# Patient Record
Sex: Male | Born: 1985 | Race: Black or African American | Hispanic: No | Marital: Married | Smoking: Never smoker
Health system: Southern US, Community
[De-identification: ages and names within clinical notes are randomized; demographics above are authoritative.]

## PROBLEM LIST (undated history)

## (undated) ENCOUNTER — Ambulatory Visit (HOSPITAL_COMMUNITY)

## (undated) DIAGNOSIS — Z87898 Personal history of other specified conditions: Secondary | ICD-10-CM

## (undated) DIAGNOSIS — Z982 Presence of cerebrospinal fluid drainage device: Secondary | ICD-10-CM

## (undated) DIAGNOSIS — G8929 Other chronic pain: Secondary | ICD-10-CM

## (undated) DIAGNOSIS — M549 Dorsalgia, unspecified: Secondary | ICD-10-CM

## (undated) DIAGNOSIS — J45909 Unspecified asthma, uncomplicated: Secondary | ICD-10-CM

## (undated) DIAGNOSIS — H547 Unspecified visual loss: Secondary | ICD-10-CM

## (undated) DIAGNOSIS — G43909 Migraine, unspecified, not intractable, without status migrainosus: Secondary | ICD-10-CM

## (undated) DIAGNOSIS — R569 Unspecified convulsions: Secondary | ICD-10-CM

## (undated) DIAGNOSIS — G919 Hydrocephalus, unspecified: Secondary | ICD-10-CM

## (undated) DIAGNOSIS — C801 Malignant (primary) neoplasm, unspecified: Secondary | ICD-10-CM

## (undated) DIAGNOSIS — H548 Legal blindness, as defined in USA: Secondary | ICD-10-CM

## (undated) HISTORY — DX: Unspecified convulsions: R56.9

## (undated) HISTORY — DX: Unspecified asthma, uncomplicated: J45.909

## (undated) HISTORY — PX: WISDOM TOOTH EXTRACTION: SHX21

## (undated) HISTORY — DX: Unspecified visual loss: H54.7

## (undated) HISTORY — DX: Presence of cerebrospinal fluid drainage device: Z98.2

## (undated) HISTORY — DX: Personal history of other specified conditions: Z87.898

## (undated) HISTORY — DX: Migraine, unspecified, not intractable, without status migrainosus: G43.909

## (undated) HISTORY — PX: VENTRICULO-PERITONEAL SHUNT PLACEMENT / LAPAROSCOPIC INSERTION PERITONEAL CATHETER: SUR1040

## (undated) HISTORY — PX: BRAIN SURGERY: SHX531

---

## 2009-06-29 HISTORY — PX: SPINAL FUSION: SHX223

## 2014-03-15 ENCOUNTER — Emergency Department (HOSPITAL_COMMUNITY)
Admission: EM | Admit: 2014-03-15 | Discharge: 2014-03-15 | Disposition: A | Discharge status: Home / Self Care | Payer: Medicaid Other | Attending: Emergency Medicine | Admitting: Emergency Medicine

## 2014-03-15 DIAGNOSIS — M542 Cervicalgia: Secondary | ICD-10-CM | POA: Diagnosis not present

## 2014-03-15 DIAGNOSIS — Z8669 Personal history of other diseases of the nervous system and sense organs: Secondary | ICD-10-CM | POA: Diagnosis not present

## 2014-03-15 DIAGNOSIS — Z760 Encounter for issue of repeat prescription: Secondary | ICD-10-CM | POA: Diagnosis not present

## 2014-03-15 MED ORDER — NAPROXEN 500 MG PO TABS: 500.0000 mg | ORAL_TABLET | Freq: Two times a day (BID) | ORAL | Status: DC

## 2014-03-15 MED ORDER — LEVETIRACETAM 1000 MG PO TABS: 1000.0000 mg | ORAL_TABLET | Freq: Two times a day (BID) | ORAL | Status: DC

## 2014-03-15 NOTE — Discharge Instructions (Signed)
Take the prescribed medication as directed. Follow-up with neurology and neurosurgeon as we discussed.  Use the resource guide to help find a primary care physician in the area. Return to the ED for new or worsening symptoms.   Emergency Department Resource Guide 1) Find a Doctor and Pay Out of Pocket Although you won't have to find out who is covered by your insurance plan, it is a good idea to ask around and get recommendations. You will then need to call the office and see if the doctor you have chosen will accept you as a new patient and what types of options they offer for patients who are self-pay. Some doctors offer discounts or will set up payment plans for their patients who do not have insurance, but you will need to ask so you aren't surprised when you get to your appointment.  2) Contact Your Local Health Department Not all health departments have doctors that can see patients for sick visits, but many do, so it is worth a call to see if yours does. If you don't know where your local health department is, you can check in your phone book. The CDC also has a tool to help you locate your state's health department, and many state websites also have listings of all of their local health departments.  3) Find a [facility] If your illness is not likely to be very severe or complicated, you may want to try a walk in clinic. These are popping up all over the country in pharmacies, drugstores, and shopping centers. They're usually staffed by nurse practitioners or physician assistants that have been trained to treat common illnesses and complaints. They're usually fairly quick and inexpensive. However, if you have serious medical issues or chronic medical problems, these are probably not your best option.  No Primary Care Doctor: - Call Health Connect at  [phone] - they can help you locate a primary care doctor that  accepts your insurance, provides certain services, etc. - Physician  Referral Service- [phone]  Chronic Pain Problems: Organization         Address  Phone   Notes  [facility]  [phone] Patients need to be referred by their primary care doctor.   Medication Assistance: Organization         Address  Phone   Notes  [facility] Medication [facility] [place] [place], [place] [address] [phone] --Must be a resident of [facility] -- Must have NO insurance coverage whatsoever (no Medicaid/ Medicare, etc.) -- The pt. MUST have a primary care doctor that directs their care regularly and follows them in the community   Med[facility]ist  [phone]   [employer]  [phone]    Agencies that provide inexpensive medical care: Organization         Address  Phone   Notes  [place]  [phone]   [facility]    [phone]   [facility] [place], [place] [address] [phone]   [place] [address] [place] [phone]   Planned Parenthood    [phone]   [facility]    [phone]   [place] and [place] [address], [place] Phone:  [phone], Fax:  [phone] Hours of Operation:  9 am - 6 pm, M-F.  Also accepts Medicaid/Medicare and self-pay.  [facility] for [place], [address], [place] Phone: [phone], Fax: [phone]. Hours of Operation:  8:30 am - 5:30 pm, M-F.  Also accepts Medicaid and self-pay.  [facility] [address] [place] Phone: [phone]   [place], [place], [place] [phone], Ext. [phone] Mondays & Thursdays: 7-9 AM.  First 15 patients are seen on a first come, first serve basis.    [place] Providers:  Organization         Address  Phone   Notes  [facility] [address], [place] [phone] Also accepts self-pay patients.  [facility] [address], [place]  [phone]   [place], [address], [place] [phone]   [facility] [address], [place] [phone]   Lucianne Lei [address], [place]   [phone] Only accepts [place] Access [place] patients after they have their name applied to their card.   Self-Pay (no insurance) in [facility]:  Organization         Address  Phone   Notes  Sickle Cell Patients, [facility] Internal Medicine [place] [phone]   [facility] [place] [phone]   [facility] [place]  [place], [place], [place] [phone]   [facility]/Dr. Osei-Bonsu  [address] [place] or [address], [place] [phone] Phone number for both [place] and [place] locations is the same.  Urgent Medical and [facility] [address] [place] [phone]   [facility] [address] [place] or [address] [phone]   [facility] [address] [place] [phone], phone; [phone], fax Sees patients 1st and 3rd Saturday of every month.  Must not qualify for public or private insurance (i.e. Medicaid, Medicare, [place] Health Choice, Veterans' Benefits)  Household income should be no more than 200% of the poverty level The clinic cannot treat you if you are pregnant or think you are pregnant  Sexually transmitted diseases are not treated at the clinic.    Dental Care: Organization         Address  Phone  Notes  [facility] Department of [facility] [place] [phone] Accepts children up to age 84 who are enrolled  in [place] or [place]; pregnant women with a Medicaid card; and children who have applied for Medicaid or [place] Health Choice, but were declined, whose parents can pay a reduced fee at time of service.  [facility] of [facility]  [address], [place] [phone] Accepts children up to age 33 who are enrolled in [place] or [place]; pregnant women with a Medicaid card; and children who have applied for Medicaid or [place] Health Choice, but were declined, whose parents can pay a reduced fee at time of service.  [place] Adult Dental Access PROGRAM  [place] [phone] Patients are seen by appointment only. Walk-ins are not accepted. [place] will see patients 49 years of age and older. Monday - Tuesday (8am-5pm) Most Wednesdays (8:30-5pm) $30 per  visit, cash only  [facility] Adult [employer] PROGRAM  [address], [facility] [phone] Patients are seen by appointment only. Walk-ins are not accepted. [place] will see patients 75 years of age and older. One Wednesday Evening (Monthly: Volunteer Based).  $30 per visit, cash only  [place]  [phone] for adults; Children under age 64, call Graduate Pediatric Dentistry at [phone]. Children aged 33-14, please call [phone] to request a pediatric application.  Dental services are provided in all areas of dental care including fillings, crowns and bridges, complete and partial dentures, implants, gum treatment, root canals, and extractions. Preventive care is also provided. Treatment is provided to both adults and children. Patients are selected via a lottery and there is often a waiting list.   [facility] [address], [place]  [phone] [email or web address]   [facility] [address] [place], [place] [phone], Ext. [phone] Second and Fourth Thursday of each month, opens at  6:30 AM; Clinic ends at 9 AM.  Patients are seen on a first-come first-served basis, and a limited number are seen during each clinic.   [facility]  [address] Hillard Danker [place], [place] [phone]   Eligibility Requirements You must have lived in [place], [place], or [place] counties for at least the last three months.   You cannot be eligible for state or federal sponsored [employer], including [employer], [place], or [employer].   You generally cannot be eligible for healthcare insurance through your employer.    How to apply: Eligibility screenings are held every Tuesday and Wednesday afternoon from 1:00 pm until 4:00 pm. You do not need an appointment for the interview!  [facility] [address], [place], [place]   [place]  [place] Department  [place]  [phone]    Behavioral Health Resources in the Community: Intensive Outpatient Programs Organization         Address  Phone  Notes  [place]. [address] [place], [place] [phone]   [facility] Outpatient [address], [place], [place]   ADS: Alcohol & Drug Svcs [address], [place], [place]   [place] [address]  [place], [place] or [phone]   Substance Abuse Resources Organization         Address  Phone  Notes  Alcohol and Drug Services  [phone]   [place]  [phone]   The [place]   Chinita Pester  [phone]   Residential & Outpatient Substance Abuse Program  [phone]   Psychological Services Organization         Address  Phone  Notes  [facility]  [place]  [phone]   [place] [address], [place] or [phone]    Mobile Crisis Teams Organization         Address  Phone  Notes  [facility], Mobile Crisis Care Unit  [phone]   [facility]ive Psychotherapeutic Services  [address] [place], [place]   [place] Levels [address] [place] [phone]    Self-Help/Support Groups Organization         Address  Phone  Notes  [facility]ental Health Assoc. of [place] - variety of support groups  [place] Call for more information  Narcotics Anonymous (NA), [facility] [address], [employer] [place]  2 meetings at this location   Special educational needs teacher         Address  Phone  Notes  ASAP Residential Treatment [place],    [place]  [phone]   [facility]  [address], [place] [phone], [place], [place]   [place], [place] [phone] Admissions: 8am-3pm M-F  Incentives Substance [place] [address],    [place], [place] [phone]   [facility] [address] [place], [place], [place]   [facility] [address],  [place], [place]   [facility]ight Programs - Intensive Outpatient [address] Kristeen [address], [place], [place]   [facility] ([place].) [place].,  [place], [place] [phone] or [phone]   [facility]idential Treatment Services (RTS) [address] [place], [place] Accepts Medicaid  [place] [address]  [place] [phone] Substance Abuse/Addiction Treatment   [facility] Organization         Address  Phone  Notes  [facility]  [phone]   Domenic Schwab, PhD [address] Arlis Porta [place], [place]   [phone] or [phone]   [place], [place] [phone]   [facility]  [address] [place], [place] [phone] Insurance/Medicaid/sponsorship through [facility] and Families [address], [address] [place]                                    [place], [place] [phone] [place] [address], [place] [phone]    Dr. Adele Schilder  [phone]   [facility] Dept. 1) [address], [place] 2) [place] 3)  [place] [address], [place] [phone]  [phone]   [place] [phone] or [phone] (After Hours)

## 2014-03-15 NOTE — ED Provider Notes (Signed)
CSN: [id number]     Arrival date & time 03/15/14  1740 History   First MD Initiated Contact with Patient 03/15/14 1012     Chief Complaint  Patient presents with  . Medication Refill     (Consider location/radiation/quality/duration/timing/severity/associated sxs/prior Treatment) The history is provided by the patient and medical records.   This is a 28 year old male with past medical history significant for seizure disorder and cervical disc fusion in 2012, presenting to the ED for medication refill. He states he recently moved to [facility] from [facility] has not yet found a PCP. He reports he has been out of his Keppra for approximately one month. He has had one seizure that occurred approximately 2 weeks ago. States seizures are very well controlled when he takes his medication appropriately. 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  States he moves around a lot more than he usually does including bending, lifting, pushing, pulling, etc.  He denies any numbness, paresthesias, or weakness of extremities. No loss of bowel or bladder control. He states he has not followed up with a neurosurgeon in several years since his surgery.  No fever, chills, sweats, headaches, or head injuries.  No past medical history on file. No past surgical history on file. No family history on file. History  Substance Use Topics  . Smoking status: Not on file  . Smokeless tobacco: Not on file  . Alcohol Use: Not on file    Review of Systems  Constitutional:       Medication refill  All other systems reviewed and are negative.     Allergies  Review of patient's allergies indicates not on file.  Home Medications   Prior to Admission medications   Not on File   BP 131/74  Pulse 59  Temp(Src) 98.2 F (36.8 C) (Oral)  Resp 16  SpO2 100%  Physical Exam  Nursing note and vitals reviewed. Constitutional: He is oriented to  person, place, and time. He appears well-developed and well-nourished.  HENT:  Head: Normocephalic and atraumatic.  Mouth/Throat: Oropharynx is clear and moist.  Eyes: Conjunctivae and EOM are normal. Pupils are equal, round, and reactive to light.  Neck: Normal range of motion and full passive range of motion without pain. Neck supple. No spinous process tenderness and no muscular tenderness present. No rigidity. No edema present.  Well healed anterior surgical incision; full ROM of neck without difficulty; normal strength BUE; no meningismus  Cardiovascular: Normal rate, regular rhythm and normal heart sounds.   Pulmonary/Chest: Effort normal and breath sounds normal. No respiratory distress. He has no wheezes.  Abdominal: Soft. Bowel sounds are normal. There is no tenderness. There is no guarding.  Musculoskeletal: Normal range of motion.  Neurological: He is alert and oriented to person, place, and time.  AAOx3, answering questions appropriately; equal strength UE and LE bilaterally; CN grossly intact; moves all extremities appropriately without ataxia; no focal neuro deficits or facial asymmetry appreciated  Skin: Skin is warm and dry.  Psychiatric: He has a normal mood and affect.    ED Course  Procedures (including critical care time) Labs Review Labs Reviewed - No data to display  Imaging Review No results found.   EKG Interpretation None      MDM   Final diagnoses:  Medication refill  Neck pain   Exacerbation of chronic neck pain, likely from new job.  No red flag sx or neuro  deficits noted on exam.  Naprosyn for pain control.  Will also refill keppra.  FU with neurosurgery, neurology, and PCP.  Resource guide attached and referrals given.  Discussed plan with patient, he/she acknowledged understanding and agreed with plan of care.  Return precautions given for new or worsening symptoms.  Larene Pickett, PA-C 03/15/14 1044  Larene Pickett, PA-C 03/15/14 1044

## 2014-03-15 NOTE — ED Notes (Signed)
Per pt, states he has been out of his Kepra-recently moved and has not found PCP yet-also complaining of neck and back pain

## 2014-03-16 NOTE — ED Provider Notes (Signed)
Medical screening examination/treatment/procedure(s) were performed by non-physician practitioner and as supervising physician I was immediately available for consultation/collaboration.     Mirna Mires, MD 03/16/14 [phone]

## 2014-04-06 ENCOUNTER — Ambulatory Visit (INDEPENDENT_AMBULATORY_CARE_PROVIDER_SITE_OTHER): Payer: Medicaid Other | Admitting: Diagnostic Neuroimaging

## 2014-04-06 ENCOUNTER — Encounter: Payer: Self-pay | Admitting: Diagnostic Neuroimaging

## 2014-04-06 VITALS — BP 107/66 | HR 57 | Ht 74.0 in | Wt 184.2 lb

## 2014-04-06 DIAGNOSIS — G40919 Epilepsy, unspecified, intractable, without status epilepticus: Secondary | ICD-10-CM | POA: Insufficient documentation

## 2014-04-06 DIAGNOSIS — G40309 Generalized idiopathic epilepsy and epileptic syndromes, not intractable, without status epilepticus: Secondary | ICD-10-CM

## 2014-04-06 DIAGNOSIS — G40909 Epilepsy, unspecified, not intractable, without status epilepticus: Secondary | ICD-10-CM

## 2014-04-06 DIAGNOSIS — Z982 Presence of cerebrospinal fluid drainage device: Secondary | ICD-10-CM

## 2014-04-06 DIAGNOSIS — M542 Cervicalgia: Secondary | ICD-10-CM

## 2014-04-06 DIAGNOSIS — D332 Benign neoplasm of brain, unspecified: Secondary | ICD-10-CM | POA: Insufficient documentation

## 2014-04-06 DIAGNOSIS — Z981 Arthrodesis status: Secondary | ICD-10-CM

## 2014-04-06 MED ORDER — LEVETIRACETAM 750 MG PO TABS: 1500.0000 mg | ORAL_TABLET | Freq: Two times a day (BID) | ORAL | Status: DC

## 2014-04-06 NOTE — Patient Instructions (Signed)
Change levetiracetam to 1500mg  twice a day.

## 2014-04-06 NOTE — Progress Notes (Signed)
[facility]  PATIENT: Don Reed DOB: 06/02/1986  REFERRING CLINICIAN: Steinl HISTORY FROM: patient and girlfriend REASON FOR VISIT: new consult    HISTORICAL  CHIEF COMPLAINT:  Chief Complaint  Patient presents with  . Seizures  . Back Pain  . Neck Pain    HISTORY OF PRESENT ILLNESS:   28 year old right-handed male here for evaluation of seizures.  Age 84 years old patient had slurred speech, multiple syncopal events, diagnosed with brain tumor (benign). Patient had tumor resection and shunt placement. 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 In 1999 patient had shunt revision. 2010 patient had first seizure of life, consisting of loss of consciousness, convulsions, incontinence, sore muscles. No tongue biting. Sometimes patient would have warning of a "dry taste in mouth". This was started on Depakote initially but then transitioned to [place] for better seizure control. Patient continues to have seizures once every one to 3 months. Patient was living in [place] until last year when he moved to [place], [place]. One month ago patient with [facility]. Last seizure was in August 2015. Patient had run out of medications in the emergency room for medication refill.  Also patient has history of cervical spine fusion surgery in 2011. Patient has chronic neck pain radiating to the bilateral upper extremities.   REVIEW OF SYSTEMS: Full 14 system review of systems performed and notable only for as per history of present illness. Otherwise negative.  ALLERGIES: Allergies  Allergen Reactions  . Shellfish Allergy   . Vancomycin     HOME MEDICATIONS: Outpatient Prescriptions Prior to Visit  Medication Sig Dispense Refill  . naproxen (NAPROSYN) 500 MG tablet Take 1 tablet (500 mg total) by mouth 2 (two) times daily with a meal.  30 tablet  0  . levETIRAcetam (KEPPRA) 1000 MG tablet Take 1 tablet (1,000 mg total)  by mouth 2 (two) times daily.  60 tablet  0   No facility-administered medications prior to visit.    PAST MEDICAL HISTORY: Past Medical History  Diagnosis Date  . Asthma   . Seizure   . Migraine   . Vision loss     PAST SURGICAL HISTORY: Past Surgical History  Procedure Laterality Date  . Brain surgery  age 66    tumor  . Spinal fusion  2011    FAMILY HISTORY: Family History  Problem Relation Age of Onset  . Hypertension Mother   . Seizures Mother   . Cataracts Mother   . Stroke Father   . Scoliosis Sister   . Vision loss Sister   . Vision loss Brother   . Diabetes Brother   . Leukemia Brother     SOCIAL HISTORY:  History   Social History  . Marital Status: Legally Separated    Spouse Name: N/A    Number of Children: 2  . Years of Education: GED   Occupational History  .  Industries [employer]   Social History Main Topics  . Smoking status: Never Smoker   . Smokeless tobacco: Never Used  . Alcohol Use: No  . Drug Use: No  . Sexual Activity: Not on file   Other Topics Concern  . Not on file   Social History Narrative   Patient lives at home with family.   Caffeine Use: 1-3 time weekly     PHYSICAL EXAM  Filed Vitals:   04/06/14 0939  BP: 107/66  Pulse: 57  Height: 6\' 2"  (1.88 m)  Weight: 184 lb  3.2 oz (83.553 kg)    Not recorded    Body mass index is 23.64 kg/(m^2).  GENERAL EXAM: Patient is in no distress; well developed, nourished and groomed; neck is supple  CARDIOVASCULAR: Regular rate and rhythm, no murmurs, no carotid bruits  NEUROLOGIC: MENTAL STATUS: awake, alert, oriented to person, place and time, recent and remote memory intact, normal attention and concentration, language fluent, comprehension intact, naming intact, fund of knowledge appropriate CRANIAL NERVE: no papilledema on fundoscopic exam; RIGHT EYE MINIMAL LIGHT/MOTION PERCEPTION; LEFT EYE CAN COUNT FINGERS; pupils equal and reactive to light, visual fields full to  confrontation IN LEFT EYE, extraocular muscles intact, NYSTAGMUS ON RIGHT GAZE, facial sensation and strength symmetric, hearing intact, palate elevates symmetrically, uvula midline, shoulder shrug symmetric, tongue midline. MOTOR: normal bulk and tone, full strength in the BUE, BLE SENSORY: normal and symmetric to light touch, pinprick, temperature, vibration  COORDINATION: finger-nose-finger, fine finger movements normal REFLEXES: deep tendon reflexes present and symmetric GAIT/STATION: narrow based gait; romberg is negative    DIAGNOSTIC DATA (LABS, IMAGING, TESTING) - I reviewed patient records, labs, notes, testing and imaging myself where available.  No results found for this basename: WBC, HGB, HCT, MCV, PLT   No results found for this basename: na, k, cl, co2, glucose, bun, creatinine, calcium, prot, albumin, ast, alt, alkphos, bilitot, gfrnonaa, gfraa   No results found for this basename: CHOL, HDL, LDLCALC, LDLDIRECT, TRIG, CHOLHDL   No results found for this basename: HGBA1C   No results found for this basename: VITAMINB12   No results found for this basename: TSH       ASSESSMENT AND PLAN  28 y.o. year old male here with history of benign brain tumor, hydrocephalus, status post resection and shunt placement, with seizure disorder since 2010. Will increase Keppra for better seizure control. Patient to followup with neurosurgery regarding cervical spine surgery and neck pain.  PLAN: - increase LEV to 1500mg  BID  Meds ordered this encounter  Medications  . levETIRAcetam (KEPPRA) 750 MG tablet    Sig: Take 2 tablets (1,500 mg total) by mouth 2 (two) times daily.    Dispense:  120 tablet    Refill:  12   Return in about 3 months (around 07/07/2014).    Penni Bombard, MD 00/09/5995, 74:14 AM Certified in Neurology, Neurophysiology and Neuroimaging  [facility] [address] [place], [place] [address] [phone]

## 2014-05-05 ENCOUNTER — Emergency Department (HOSPITAL_COMMUNITY)
Admission: EM | Admit: 2014-05-05 | Discharge: 2014-05-05 | Disposition: A | Discharge status: Home / Self Care | Payer: Medicaid Other | Attending: Emergency Medicine | Admitting: Emergency Medicine

## 2014-05-05 DIAGNOSIS — Z791 Long term (current) use of non-steroidal anti-inflammatories (NSAID): Secondary | ICD-10-CM | POA: Insufficient documentation

## 2014-05-05 DIAGNOSIS — Z113 Encounter for screening for infections with a predominantly sexual mode of transmission: Secondary | ICD-10-CM | POA: Diagnosis present

## 2014-05-05 DIAGNOSIS — J45909 Unspecified asthma, uncomplicated: Secondary | ICD-10-CM | POA: Insufficient documentation

## 2014-05-05 DIAGNOSIS — G40909 Epilepsy, unspecified, not intractable, without status epilepticus: Secondary | ICD-10-CM | POA: Insufficient documentation

## 2014-05-05 DIAGNOSIS — Z79899 Other long term (current) drug therapy: Secondary | ICD-10-CM | POA: Diagnosis not present

## 2014-05-05 DIAGNOSIS — Z8679 Personal history of other diseases of the circulatory system: Secondary | ICD-10-CM | POA: Diagnosis not present

## 2014-05-05 DIAGNOSIS — A6002 Herpesviral infection of other male genital organs: Secondary | ICD-10-CM | POA: Insufficient documentation

## 2014-05-05 DIAGNOSIS — H547 Unspecified visual loss: Secondary | ICD-10-CM | POA: Insufficient documentation

## 2014-05-05 DIAGNOSIS — A6000 Herpesviral infection of urogenital system, unspecified: Secondary | ICD-10-CM

## 2014-05-05 MED ORDER — CEFTRIAXONE SODIUM 250 MG IJ SOLR
250.0000 mg | Freq: Once | INTRAMUSCULAR | Status: AC
  Administered 2014-05-05: 250 mg via INTRAMUSCULAR
  Filled 2014-05-05: qty 250

## 2014-05-05 MED ORDER — ACYCLOVIR 400 MG PO TABS: 400.0000 mg | ORAL_TABLET | Freq: Three times a day (TID) | ORAL | Status: DC

## 2014-05-05 MED ORDER — AZITHROMYCIN 250 MG PO TABS
1000.0000 mg | ORAL_TABLET | Freq: Once | ORAL | Status: AC
  Administered 2014-05-05: 1000 mg via ORAL
  Filled 2014-05-05: qty 4

## 2014-05-05 NOTE — Discharge Instructions (Signed)
Genital Herpes °Genital herpes is a sexually transmitted disease. This means that it is a disease passed by having sex with an infected person. There is no cure for genital herpes. The time between attacks can be months to years. The virus may live in a person but produce no problems (symptoms). This infection can be passed to a baby as it travels down the birth canal (vagina). In a newborn, this can cause central nervous system damage, eye damage, or even death. The virus that causes genital herpes is usually HSV-2 virus. The virus that causes oral herpes is usually HSV-1. The diagnosis (learning what is wrong) is made through culture results. °SYMPTOMS  °Usually symptoms of pain and itching begin a few days to a week after contact. It first appears as small blisters that progress to small painful ulcers which then scab over and heal after several days. It affects the outer genitalia, birth canal, cervix, penis, anal area, buttocks, and thighs. °HOME CARE INSTRUCTIONS  °· Keep ulcerated areas dry and clean. °· Take medications as directed. Antiviral medications can speed up healing. They will not prevent recurrences or cure this infection. These medications can also be taken for suppression if there are frequent recurrences. °· While the infection is active, it is contagious. Avoid all sexual contact during active infections. °· Condoms may help prevent spread of the herpes virus. °· Practice safe sex. °· Wash your hands thoroughly after touching the genital area. °· Avoid touching your eyes after touching your genital area. °· Inform your caregiver if you have had genital herpes and become pregnant. It is your responsibility to insure a safe outcome for your baby in this pregnancy. °· Only take over-the-counter or prescription medicines for pain, discomfort, or fever as directed by your caregiver. °SEEK MEDICAL CARE IF:  °· You have a recurrence of this infection. °· You do not respond to medications and are not  improving. °· You have new sources of pain or discharge which have changed from the original infection. °· You have an oral temperature above 102° F (38.9° C). °· You develop abdominal pain. °· You develop eye pain or signs of eye infection. °Document Released: 06/12/2000 Document Revised: 09/07/2011 Document Reviewed: 07/03/2009 °Exit[employer]® Patient Information ©2015 [employer]. This information is not intended to replace advice given to you by your health care provider. Make sure you discuss any questions you have with your health care provider. ° °

## 2014-05-05 NOTE — ED Notes (Signed)
Pt reports groin pain x2 days, reports dysuria and bumps on penis. Pt thinks he might have herpes. Pain 10/10. Reports penile discharge, reports unprotected sex recently.

## 2014-05-05 NOTE — ED Provider Notes (Signed)
CSN: [id number]     Arrival date & time 05/05/14  1606 History  This chart was scribed for non-physician practitioner, Hyman Bible, PA-C, working with Jasper Riling. Alvino Chapel, MD, by Delphia Grates, ED Scribe. This patient was seen in room [place] and the patient's care was started at 5:24 PM.    Chief Complaint  Patient presents with  . SEXUALLY TRANSMITTED DISEASE    The history is provided by the patient. No language interpreter was used.    HPI Comments: Don Reed is a 28 y.o. male who presents to the Emergency Department complaining of possible STD onset 2 days ago. Patient is sexually active and reports associated pain and tenderness in the groin area, mild dysuria, penile bumps, and possible penile discharge. Patient denies history of UTI. Denies history of Herpes or Syphilis.  He denies fever, chills, nausea, vomiting, or abdominal pain.  Denies scrotal swelling or pain.     Past Medical History  Diagnosis Date  . Asthma   . Seizure   . Migraine   . Vision loss    Past Surgical History  Procedure Laterality Date  . Brain surgery  age 52    tumor  . Spinal fusion  2011   Family History  Problem Relation Age of Onset  . Hypertension Mother   . Seizures Mother   . Cataracts Mother   . Stroke Father   . Scoliosis Sister   . Vision loss Sister   . Vision loss Brother   . Diabetes Brother   . Leukemia Brother    History  Substance Use Topics  . Smoking status: Never Smoker   . Smokeless tobacco: Never Used  . Alcohol Use: No    Review of Systems  Constitutional: Negative for fever and chills.  Gastrointestinal: Negative for nausea, vomiting and abdominal pain.  Genitourinary: Positive for dysuria, discharge, genital sores and penile pain.      Allergies  Shellfish allergy and Vancomycin  Home Medications   Prior to Admission medications   Medication Sig Start Date End Date Taking? Authorizing Provider  levETIRAcetam (KEPPRA) 750 MG tablet  Take 2 tablets (1,500 mg total) by mouth 2 (two) times daily. 04/06/14   Penni Bombard, MD  naproxen (NAPROSYN) 500 MG tablet Take 1 tablet (500 mg total) by mouth 2 (two) times daily with a meal. 03/15/14   Larene Pickett, PA-C   Triage Vitals: BP 105/67 mmHg  Pulse 67  Temp(Src) 98 F (36.7 C) (Oral)  Resp 17  SpO2 98%  Physical Exam  Constitutional: He is oriented to person, place, and time. He appears well-developed and well-nourished. No distress.  HENT:  Head: Normocephalic and atraumatic.  Eyes: Conjunctivae and EOM are normal.  Neck: No tracheal deviation present.  Cardiovascular: Normal rate, regular rhythm and normal heart sounds.   Pulmonary/Chest: Effort normal and breath sounds normal. No respiratory distress.  Genitourinary: Right testis shows no mass, no swelling and no tenderness. Left testis shows no mass, no swelling and no tenderness. No discharge found.  Several small tender erythematous vesicular lesions located on the shaft and foreskin of the penis  Musculoskeletal: Normal range of motion.  Lymphadenopathy:       Right: Inguinal adenopathy present.       Left: Inguinal adenopathy present.  Neurological: He is alert and oriented to person, place, and time.  Skin: Skin is warm and dry.  Psychiatric: He has a normal mood and affect. His behavior is normal.  Nursing note and vitals  reviewed.   ED Course  Procedures (including critical care time)  DIAGNOSTIC STUDIES: Oxygen Saturation is 98% on room air, normal by my interpretation.    COORDINATION OF CARE: At 1728 Discussed treatment plan with patient which includes Rocephin injection, azithromycin, and labs to test for GC/Chlamydia, HIV, Syphilis. Patient agrees.   Labs Review Labs Reviewed - No data to display  Imaging Review No results found.   EKG Interpretation None      MDM   Final diagnoses:  None   Patient presents today with painful penile lesions.  He is sexually active and  concerned for STD.  STD panel ordered.  Results pending.  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  Patient given Rx for Acyclovir.  Patient also given prophylactic Rocephin and Azithromycin in the ED.  No scrotal pain, tenderness, or swelling on exam.  Patient stable for discharge.  Return precautions given.   Hyman Bible, PA-C 05/05/14 2008  Jasper Riling. Alvino Chapel, MD 05/05/14 2351

## 2014-05-07 LAB — GC/CHLAMYDIA PROBE AMP
CT PROBE, AMP APTIMA: NEGATIVE
GC Probe RNA: NEGATIVE

## 2014-05-07 LAB — HIV ANTIBODY (ROUTINE TESTING W REFLEX): HIV: NONREACTIVE

## 2014-05-07 LAB — RPR

## 2014-06-06 ENCOUNTER — Encounter (HOSPITAL_COMMUNITY): Payer: Self-pay | Admitting: *Deleted

## 2014-06-06 ENCOUNTER — Emergency Department (HOSPITAL_COMMUNITY)
Admission: EM | Admit: 2014-06-06 | Discharge: 2014-06-07 | Disposition: A | Discharge status: Home / Self Care | Payer: Medicaid Other | Attending: Emergency Medicine | Admitting: Emergency Medicine

## 2014-06-06 ENCOUNTER — Emergency Department (HOSPITAL_COMMUNITY): Payer: Medicaid Other

## 2014-06-06 DIAGNOSIS — Z79899 Other long term (current) drug therapy: Secondary | ICD-10-CM | POA: Insufficient documentation

## 2014-06-06 DIAGNOSIS — Z880 Allergy status to penicillin: Secondary | ICD-10-CM | POA: Insufficient documentation

## 2014-06-06 DIAGNOSIS — G40909 Epilepsy, unspecified, not intractable, without status epilepticus: Secondary | ICD-10-CM | POA: Insufficient documentation

## 2014-06-06 DIAGNOSIS — J45909 Unspecified asthma, uncomplicated: Secondary | ICD-10-CM | POA: Insufficient documentation

## 2014-06-06 DIAGNOSIS — M546 Pain in thoracic spine: Secondary | ICD-10-CM | POA: Insufficient documentation

## 2014-06-06 DIAGNOSIS — Z8679 Personal history of other diseases of the circulatory system: Secondary | ICD-10-CM | POA: Insufficient documentation

## 2014-06-06 DIAGNOSIS — Z982 Presence of cerebrospinal fluid drainage device: Secondary | ICD-10-CM | POA: Diagnosis not present

## 2014-06-06 DIAGNOSIS — R519 Headache, unspecified: Secondary | ICD-10-CM

## 2014-06-06 DIAGNOSIS — Z791 Long term (current) use of non-steroidal anti-inflammatories (NSAID): Secondary | ICD-10-CM | POA: Diagnosis not present

## 2014-06-06 DIAGNOSIS — R51 Headache: Secondary | ICD-10-CM | POA: Insufficient documentation

## 2014-06-06 HISTORY — DX: Hydrocephalus, unspecified: G91.9

## 2014-06-06 LAB — BASIC METABOLIC PANEL
ANION GAP: 11 (ref 5–15)
BUN: 15 mg/dL (ref 6–23)
CHLORIDE: 102 meq/L (ref 96–112)
CO2: 26 mEq/L (ref 19–32)
CREATININE: 1.15 mg/dL (ref 0.50–1.35)
Calcium: 9.3 mg/dL (ref 8.4–10.5)
GFR calc Af Amer: >90 mL/min (ref >90)
GFR, EST NON AFRICAN AMERICAN: 85 mL/min — AB (ref >90)
Glucose, Bld: 82 mg/dL (ref 70–99)
Potassium: 4 mEq/L (ref 3.7–5.3)
Sodium: 139 mEq/L (ref 137–147)

## 2014-06-06 LAB — URINALYSIS, ROUTINE W REFLEX MICROSCOPIC
Bilirubin Urine: NEGATIVE
Glucose, UA: NEGATIVE mg/dL
Hgb urine dipstick: NEGATIVE
KETONES UR: NEGATIVE mg/dL
LEUKOCYTES UA: NEGATIVE
NITRITE: NEGATIVE
PROTEIN: NEGATIVE mg/dL
Specific Gravity, Urine: 1.013 (ref 1.005–1.030)
Urobilinogen, UA: 0.2 mg/dL (ref 0.0–1.0)
pH: 7.5 (ref 5.0–8.0)

## 2014-06-06 LAB — CBC WITH DIFFERENTIAL/PLATELET
BASOS ABS: 0 10*3/uL (ref 0.0–0.1)
Basophils Relative: 1 % (ref 0–1)
EOS ABS: 0.5 10*3/uL (ref 0.0–0.7)
Eosinophils Relative: 8 % — ABNORMAL HIGH (ref 0–5)
HCT: 36.1 % — ABNORMAL LOW (ref 39.0–52.0)
Hemoglobin: 12.1 g/dL — ABNORMAL LOW (ref 13.0–17.0)
Lymphocytes Relative: 43 % (ref 12–46)
Lymphs Abs: 2.6 10*3/uL (ref 0.7–4.0)
MCH: 25.8 pg — ABNORMAL LOW (ref 26.0–34.0)
MCHC: 33.5 g/dL (ref 30.0–36.0)
MCV: 77 fL — ABNORMAL LOW (ref 78.0–100.0)
Monocytes Absolute: 0.6 10*3/uL (ref 0.1–1.0)
Monocytes Relative: 10 % (ref 3–12)
NEUTROS ABS: 2.3 10*3/uL (ref 1.7–7.7)
NEUTROS PCT: 38 % — AB (ref 43–77)
Platelets: 193 10*3/uL (ref 150–400)
RBC: 4.69 MIL/uL (ref 4.22–5.81)
RDW: 12.7 % (ref 11.5–15.5)
WBC: 6.1 10*3/uL (ref 4.0–10.5)

## 2014-06-06 MED ORDER — METOCLOPRAMIDE HCL 5 MG/ML IJ SOLN
5.0000 mg | Freq: Once | INTRAMUSCULAR | Status: AC
  Administered 2014-06-06: 5 mg via INTRAVENOUS
  Filled 2014-06-06: qty 2

## 2014-06-06 MED ORDER — DIPHENHYDRAMINE HCL 50 MG/ML IJ SOLN
12.5000 mg | Freq: Once | INTRAMUSCULAR | Status: AC
  Administered 2014-06-06: 12.5 mg via INTRAVENOUS
  Filled 2014-06-06: qty 1

## 2014-06-06 MED ORDER — SODIUM CHLORIDE 0.9 % IV BOLUS (SEPSIS)
1000.0000 mL | Freq: Once | INTRAVENOUS | Status: AC
  Administered 2014-06-06: 1000 mL via INTRAVENOUS

## 2014-06-06 NOTE — ED Provider Notes (Signed)
CSN: [id number]     Arrival date & time 06/06/14  1628 History   First MD Initiated Contact with Patient 06/06/14 1855     Chief Complaint  Patient presents with  . Back Pain  . Headache     (Consider location/radiation/quality/duration/timing/severity/associated sxs/prior Treatment) Patient is a 28 y.o. male presenting with back pain and headaches. The history is provided by the patient.  Back Pain Associated symptoms: headaches   Associated symptoms: no abdominal pain, no chest pain, no dysuria, no fever and no numbness   Headaches:    Severity:  Mild   Onset quality:  Gradual   Duration:  5 hours   Timing:  Constant   Progression:  Partially resolved   Chronicity:  New Risk factors comment:  Vp shunt Headache Pain location:  R temporal Quality:  Dull Radiates to:  Does not radiate Severity currently:  5/10 Severity at highest:  8/10 Onset quality:  Gradual Duration:  5 hours Timing:  Constant Progression:  Improving Chronicity:  New Similar to prior headaches: no   Context comment:  While at work Relieved by:  NSAIDs Worsened by:  Nothing tried Ineffective treatments:  None tried Associated symptoms: back pain   Associated symptoms: no abdominal pain, no cough, no diarrhea, no pain, no fever, no nausea, no neck pain, no numbness and no vomiting     Past Medical History  Diagnosis Date  . Asthma   . Seizure   . Migraine   . Vision loss   . Hydrocephalus    Past Surgical History  Procedure Laterality Date  . Brain surgery  age 13    tumor  . Spinal fusion  2011   Family History  Problem Relation Age of Onset  . Hypertension Mother   . Seizures Mother   . Cataracts Mother   . Stroke Father   . Scoliosis Sister   . Vision loss Sister   . Vision loss Brother   . Diabetes Brother   . Leukemia Brother    History  Substance Use Topics  . Smoking status: Never Smoker   . Smokeless tobacco: Never Used  . Alcohol Use: No    Review of Systems   Constitutional: Negative for fever.  HENT: Negative for drooling and rhinorrhea.   Eyes: Negative for pain.  Respiratory: Negative for cough and shortness of breath.   Cardiovascular: Negative for chest pain and leg swelling.  Gastrointestinal: Negative for nausea, vomiting, abdominal pain and diarrhea.  Genitourinary: Negative for dysuria and hematuria.  Musculoskeletal: Positive for back pain. Negative for gait problem and neck pain.  Skin: Negative for color change.  Neurological: Positive for headaches. Negative for numbness.  Hematological: Negative for adenopathy.  Psychiatric/Behavioral: Negative for behavioral problems.  All other systems reviewed and are negative.     Allergies  Amoxicillin; Shellfish allergy; and Vancomycin  Home Medications   Prior to Admission medications   Medication Sig Start Date End Date Taking? Authorizing Provider  levETIRAcetam (KEPPRA) 750 MG tablet Take 2 tablets (1,500 mg total) by mouth 2 (two) times daily. 04/06/14  Yes Penni Bombard, MD  acyclovir (ZOVIRAX) 400 MG tablet Take 1 tablet (400 mg total) by mouth 3 (three) times daily. Patient not taking: Reported on 06/06/2014 05/05/14   Hyman Bible, PA-C  naproxen (NAPROSYN) 500 MG tablet Take 1 tablet (500 mg total) by mouth 2 (two) times daily with a meal. Patient not taking: Reported on 06/06/2014 03/15/14   Larene Pickett, PA-C   BP  118/80 mmHg  Pulse 74  Temp(Src) 98.2 F (36.8 C)  Resp 17  SpO2 100% Physical Exam  Constitutional: He is oriented to person, place, and time. He appears well-developed and well-nourished.  HENT:  Head: Normocephalic and atraumatic.  Right Ear: External ear normal.  Left Ear: External ear normal.  Nose: Nose normal.  Mouth/Throat: Oropharynx is clear and moist. No oropharyngeal exudate.  Normal-appearing tympanic membranes bilaterally. Normal-appearing VP shunt on right lateral parietal area.  Eyes: Conjunctivae and EOM are normal. Pupils are  equal, round, and reactive to light.  Neck: Normal range of motion. Neck supple.  Cardiovascular: Normal rate, regular rhythm, normal heart sounds and intact distal pulses.  Exam reveals no gallop and no friction rub.   No murmur heard. Pulmonary/Chest: Effort normal and breath sounds normal. No respiratory distress. He has no wheezes.  Abdominal: Soft. Bowel sounds are normal. He exhibits no distension. There is no tenderness. There is no rebound and no guarding.  Musculoskeletal: Normal range of motion. He exhibits tenderness. He exhibits no edema.  Mild tenderness to palpation to the right posterior trapezius.   Mild midline lower cervical and upper thoracic tenderness to palpation.  Neurological: He is alert and oriented to person, place, and time.  alert, oriented x3 speech: normal in context and clarity memory: intact grossly cranial nerves II-XII: intact motor strength: full proximally and distally no involuntary movements or tremors sensation: intact to light touch diffusely  cerebellar:  heel-to-shin intact gait: normal forwards and backwards   Skin: Skin is warm and dry.  Psychiatric: He has a normal mood and affect. His behavior is normal.  Nursing note and vitals reviewed.   ED Course  Procedures (including critical care time) Labs Review Labs Reviewed  URINALYSIS, ROUTINE W REFLEX MICROSCOPIC - Abnormal; Notable for the following:    APPearance CLOUDY (*)    All other components within normal limits  CBC WITH DIFFERENTIAL - Abnormal; Notable for the following:    Hemoglobin 12.1 (*)    HCT 36.1 (*)    MCV 77.0 (*)    MCH 25.8 (*)    Neutrophils Relative % 38 (*)    Eosinophils Relative 8 (*)    All other components within normal limits  BASIC METABOLIC PANEL - Abnormal; Notable for the following:    GFR calc non Af Amer 85 (*)    All other components within normal limits    Imaging Review Dg Skull 1-3 Views  06/06/2014   CLINICAL DATA:  Initial encounter  for shunt evaluation.  EXAM: SKULL - 1-3 VIEW  COMPARISON:  None.  FINDINGS: Two views of the skull show the right frontal parietal ventriculostomy shunt catheter. The radiopaque portions of the catheter visualized on this study appear intact. Pericatheter calcification is seen in the right neck.  IMPRESSION: Negative.   Electronically Signed   By: Misty Stanley M.D.   On: 06/06/2014 21:38   Dg Chest 1 View  06/06/2014   CLINICAL DATA:  Initial encounter for evaluation of ventriculostomy shunt tubing.  EXAM: CHEST - 1 VIEW  COMPARISON:  None.  FINDINGS: Frontal chest shows shunt catheter overlying the medial right hemi thorax. The thoracic portion of the catheter appears intact. Visualize lungs are clear. The cardiopericardial silhouette is within normal limits for size. Imaged bony structures of the thorax are intact.  IMPRESSION: Visualized portions of the shunt tubing appear intact.   Electronically Signed   By: Misty Stanley M.D.   On: 06/06/2014 21:33  Dg Thoracic Spine 2 View  06/06/2014   ADDENDUM REPORT: 06/06/2014 21:36  ADDENDUM: A comparison film from 01/15/2014 has been identified. This was difficult to find initially given the it was outside imaging with unclear labeling in PACs. The area of apparent discontinuity of the catheter tubing in the right neck, near the inferior margin of the associated pericatheter calcification, was present on the previous study and does not appear substantially changed in the interval. This may be artifactual related to the calcification associated with the catheter tubing. As such, close clinical correlation will be required to determine the significance of this finding.   Electronically Signed   By: Misty Stanley M.D.   On: 06/06/2014 21:36   06/06/2014   CLINICAL DATA:  Initial encounter for with a history of back pain.  EXAM: THORACIC SPINE - 2 VIEW  COMPARISON:  None.  FINDINGS: No evidence for thoracic spine fracture. Intervertebral disc spaces are preserved  throughout. No abnormal paraspinal line.  Shunt tubing overlying the right anterior chest wall is compatible with the reported clinical history ventriculostomy shunt catheter. There appears to be some calcification around the catheter in the right neck. On the swimmer's view, and discontinuity of the catheter cannot be excluded.  IMPRESSION: No evidence for thoracic spine fracture.  Lateral projection of the VP shunt tubing on the swimmer's view demonstrates apparent discontinuity of the catheter tubing, in the region the distal most extent of the calcification.  Electronically Signed: By: Misty Stanley M.D. On: 06/06/2014 21:28   Dg Abd 1 View  06/06/2014   CLINICAL DATA:  Initial encounter for shunt evaluation  EXAM: ABDOMEN - 1 VIEW  COMPARISON:  None.  FINDINGS: Supine abdomen shows shunt tubing over the right abdomen and looping down across the pelvis with the tip of the tubing overlying the left mid abdomen. Abdominal portion of the tubing appears intact. There is diffuse gaseous distention of small bowel without signs of obstruction.  IMPRESSION: Abdominal portions of the tubing appear intact.   Electronically Signed   By: Misty Stanley M.D.   On: 06/06/2014 21:32   Ct Head Wo Contrast  06/06/2014   CLINICAL DATA:  Headache, history of hydrocephalus and ventriculoperitoneal shunt. History of cervical spine fusion 2012. History of seizure disorder.  EXAM: CT HEAD WITHOUT CONTRAST  CT CERVICAL SPINE WITHOUT CONTRAST  TECHNIQUE: Multidetector CT imaging of the head and cervical spine was performed following the standard protocol without intravenous contrast. Multiplanar CT image reconstructions of the cervical spine were also generated.  COMPARISON:  CT of the head February 07, 2014, CT of the cervical spine October 06, 2013  FINDINGS: CT HEAD FINDINGS  Ventriculoperitoneal shunt via high RIGHT frontal burr hole, distal tip at the level of the foramen of Monro, unchanged positioning. No hydrocephalus. No  intraparenchymal hemorrhage, mass effect, midline shift. No acute large vascular territory infarct.  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 No abnormal extra-axial fluid collections. Basal cisterns are patent.  Remote suboccipital decompressive craniectomy. Trace LEFT maxillary sinus mucosal thickening without paranasal sinus air-fluid levels. The mastoid air cells appear well-aerated. Ocular globes and orbital contents are unremarkable.  CT CERVICAL SPINE FINDINGS  Cervical vertebral bodies appear intact and aligned with straightened cervical lordosis. Status post C5-6 ACDF with solid interbody fusion. Non surgically altered disc heights preserved. Corticated fragmentation of the spinous process of C6 may reflect remote injury. No destructive bony lesions.  Heavy concretions along ventriculoperitoneal shunt coursing in RIGHT neck.  Limited assessment for contiguity. Non surgically altered disc heights preserved.  IMPRESSION: CT HEAD: No acute intracranial process. Stable appearance of the head from February 07, 2014.  Remote suboccipital decompressive craniectomy, stable appearance of ventriculoperitoneal shunt without hydrocephalus.  CT CERVICAL SPINE:  No acute fracture or malalignment.  Heavy concretions along RIGHT ventriculoperitoneal shunt, limited assessment for contiguity.  C5-6 ACDF with solid fusion.   Electronically Signed   By: Elon Alas   On: 06/06/2014 21:21   Ct Cervical Spine Wo Contrast  06/06/2014   CLINICAL DATA:  Headache, history of hydrocephalus and ventriculoperitoneal shunt. History of cervical spine fusion 2012. History of seizure disorder.  EXAM: CT HEAD WITHOUT CONTRAST  CT CERVICAL SPINE WITHOUT CONTRAST  TECHNIQUE: Multidetector CT imaging of the head and cervical spine was performed following the standard protocol without intravenous contrast. Multiplanar CT image reconstructions of the cervical spine were also  generated.  COMPARISON:  CT of the head February 07, 2014, CT of the cervical spine October 06, 2013  FINDINGS: CT HEAD FINDINGS  Ventriculoperitoneal shunt via high RIGHT frontal burr hole, distal tip at the level of the foramen of Monro, unchanged positioning. No hydrocephalus. No intraparenchymal hemorrhage, mass effect, midline shift. No acute large vascular territory infarct.  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 No abnormal extra-axial fluid collections. Basal cisterns are patent.  Remote suboccipital decompressive craniectomy. Trace LEFT maxillary sinus mucosal thickening without paranasal sinus air-fluid levels. The mastoid air cells appear well-aerated. Ocular globes and orbital contents are unremarkable.  CT CERVICAL SPINE FINDINGS  Cervical vertebral bodies appear intact and aligned with straightened cervical lordosis. Status post C5-6 ACDF with solid interbody fusion. Non surgically altered disc heights preserved. Corticated fragmentation of the spinous process of C6 may reflect remote injury. No destructive bony lesions.  Heavy concretions along ventriculoperitoneal shunt coursing in RIGHT neck. Limited assessment for contiguity. Non surgically altered disc heights preserved.  IMPRESSION: CT HEAD: No acute intracranial process. Stable appearance of the head from February 07, 2014.  Remote suboccipital decompressive craniectomy, stable appearance of ventriculoperitoneal shunt without hydrocephalus.  CT CERVICAL SPINE:  No acute fracture or malalignment.  Heavy concretions along RIGHT ventriculoperitoneal shunt, limited assessment for contiguity.  C5-6 ACDF with solid fusion.   Electronically Signed   By: Elon Alas   On: 06/06/2014 21:21     EKG Interpretation None      MDM   Final diagnoses:  Headache  Thoracic back pain  VP (ventriculoperitoneal) shunt status    8:40 PM 28 y.o. male w hx of hydrocephalus s/p VP shunt,  Migraines, seizures who pw HA/back pain. He notes that he has a history of spinal fusion of the cervical spine. He states that he has had right lateral neck pain and thoracic pain radiating down to his right leg for the past several months. He also notes a fall several months ago where he hit his head but states that he was seen in the ER and had a noncontributory CT of his head. He states that he began having a right temporal headache today around 3 PM of gradual onset. He believes it to be related to his chronic neck and back pain. He denies any fevers or recent head injuries. He is afebrile and vital signs are unremarkable here. He is clinically blind. Will get screening labs and imaging. 5/10 HA.   11:11 PM: I interpreted/reviewed the labs and/or imaging which were non-contributory.  HA resolved w/ reglan/benadryl/IVF only. Possibly  related to chronic back pain as pt suggests. He continues to appear well on exam. Plain films showing likely artifact/calcification which was also seen several mos ago w/out significant change. Doubt discontinuity. Doubt infected shunt. Pt afebrile, well appearing w/ otherwise normal neuro exam. He notes that his HA is not in the area of the shunt.  He has close f/u w/ Dr. Hal Neer next week. I have discussed the diagnosis/risks/treatment options with the patient and believe the pt to be eligible for discharge home to follow-up with Dr. Hal Neer as scheduled next week. We also discussed returning to the ED immediately if new or worsening sx occur. We discussed the sx which are most concerning (e.g., return of HA, fever, AMS, ataxia) that necessitate immediate return. Medications administered to the patient during their visit and any new prescriptions provided to the patient are listed below.  Medications given during this visit Medications  sodium chloride 0.9 % bolus 1,000 mL (1,000 mLs Intravenous New Bag/Given 06/06/14 2122)  metoCLOPramide (REGLAN) injection 5 mg (5 mg  Intravenous Given 06/06/14 2120)  diphenhydrAMINE (BENADRYL) injection 12.5 mg (12.5 mg Intravenous Given 06/06/14 2120)    New Prescriptions   No medications on file     Pamella Pert, MD 06/06/14 2321

## 2014-06-06 NOTE — ED Notes (Signed)
Pt arrived by ptar for upper back pain and headache, hx of back surgery and hydrocephalus. No acute distress noted at triage.

## 2014-06-06 NOTE — ED Notes (Signed)
Pt given urinal to provide sample 

## 2014-07-13 ENCOUNTER — Ambulatory Visit: Payer: Medicaid Other | Admitting: Diagnostic Neuroimaging

## 2014-07-26 ENCOUNTER — Encounter (HOSPITAL_COMMUNITY): Payer: Self-pay | Admitting: Emergency Medicine

## 2014-07-26 ENCOUNTER — Emergency Department (HOSPITAL_COMMUNITY): Payer: Medicaid Other

## 2014-07-26 ENCOUNTER — Emergency Department (HOSPITAL_COMMUNITY)
Admission: EM | Admit: 2014-07-26 | Discharge: 2014-07-26 | Disposition: A | Discharge status: Home / Self Care | Payer: Medicaid Other | Attending: Emergency Medicine | Admitting: Emergency Medicine

## 2014-07-26 DIAGNOSIS — Z79899 Other long term (current) drug therapy: Secondary | ICD-10-CM | POA: Insufficient documentation

## 2014-07-26 DIAGNOSIS — R51 Headache: Secondary | ICD-10-CM | POA: Diagnosis not present

## 2014-07-26 DIAGNOSIS — H5412 Blindness, left eye, low vision right eye: Secondary | ICD-10-CM | POA: Diagnosis not present

## 2014-07-26 DIAGNOSIS — Z8679 Personal history of other diseases of the circulatory system: Secondary | ICD-10-CM | POA: Insufficient documentation

## 2014-07-26 DIAGNOSIS — G40909 Epilepsy, unspecified, not intractable, without status epilepticus: Secondary | ICD-10-CM | POA: Diagnosis present

## 2014-07-26 DIAGNOSIS — Z791 Long term (current) use of non-steroidal anti-inflammatories (NSAID): Secondary | ICD-10-CM | POA: Insufficient documentation

## 2014-07-26 DIAGNOSIS — R569 Unspecified convulsions: Secondary | ICD-10-CM

## 2014-07-26 DIAGNOSIS — J45909 Unspecified asthma, uncomplicated: Secondary | ICD-10-CM | POA: Diagnosis not present

## 2014-07-26 DIAGNOSIS — Z86011 Personal history of benign neoplasm of the brain: Secondary | ICD-10-CM | POA: Insufficient documentation

## 2014-07-26 DIAGNOSIS — R519 Headache, unspecified: Secondary | ICD-10-CM

## 2014-07-26 MED ORDER — LACOSAMIDE 50 MG PO TABS
50.0000 mg | ORAL_TABLET | Freq: Two times a day (BID) | ORAL | Status: DC
  Administered 2014-07-26: 50 mg via ORAL
  Filled 2014-07-26: qty 1

## 2014-07-26 MED ORDER — LACOSAMIDE 50 MG PO TABS: 50.0000 mg | ORAL_TABLET | Freq: Two times a day (BID) | ORAL | Status: DC

## 2014-07-26 MED ORDER — SODIUM CHLORIDE 0.9 % IV SOLN
100.0000 mg | Freq: Two times a day (BID) | INTRAVENOUS | Status: DC
  Filled 2014-07-26: qty 10

## 2014-07-26 NOTE — Progress Notes (Signed)
[facility] is [facility] [address] [place] [address] [phone]

## 2014-07-26 NOTE — Discharge Instructions (Signed)
Please call your doctor for a followup appointment within 24-48 hours. When you talk to your doctor please let them know that you were seen in the emergency department and have them acquire all of your records so that they can discuss the findings with you and formulate a treatment plan to fully care for your new and ongoing problems.  Start taking Vimpat

## 2014-07-26 NOTE — ED Notes (Signed)
Per EMS: Pt from work, wasn't feeling good.  [place] home, had witnessed seizure. There happened to be a neighbor there who saw what was happening and pt was eased to the ground.  Pt was finishing seizure upon EMS arrival.  Appx 5 min seizure.  Hx of same.  Takes keppra.  Has been compliant with meds.  Legally blind.

## 2014-07-26 NOTE — ED Notes (Signed)
Pt asymptomatic post dc with low PR 52

## 2014-07-26 NOTE — ED Provider Notes (Addendum)
CSN: [id number]     Arrival date & time 07/26/14  1004 History   First MD Initiated Contact with Patient 07/26/14 1015     Chief Complaint  Patient presents with  . 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 He started to develop seizures later in life, had been taking Depakote but was changed to [place] and has recently had his Keppra dose increased to 1500 mg twice daily. That was in October of this year. His last seizure was in August, he states that he has prodromal headaches which she has had since last night, he went home from work early today because of the headaches and had a witnessed seizure at home. Reportedly similar to prior seizures, ceased prior to EMS arrival, approximately 5 minutes in length. The patient did have urinary incontinence but no tongue biting, denies missing any doses of his medications, denies fevers chills nausea vomiting or stiff neck.  Patient is a 29 y.o. male presenting with seizures. The history is provided by the patient.  Seizures   Past Medical History  Diagnosis Date  . Asthma   . Seizure   . Migraine   . Vision loss   . Hydrocephalus    Past Surgical History  Procedure Laterality Date  . Brain surgery  age 54    tumor  . Spinal fusion  2011   Family History  Problem Relation Age of Onset  . Hypertension Mother   . Seizures Mother   . Cataracts Mother   . Stroke Father   . Scoliosis Sister   . Vision loss Sister   . Vision loss Brother   . Diabetes Brother   . Leukemia Brother    History  Substance Use Topics  . Smoking status: Never Smoker   . Smokeless tobacco: Never Used  . Alcohol Use: No    Review of Systems  Neurological: Positive for seizures.  All other  systems reviewed and are negative.     Allergies  Shellfish allergy; Amoxicillin; and Vancomycin  Home Medications   Prior to Admission medications   Medication Sig Start Date End Date Taking? Authorizing Provider  levETIRAcetam (KEPPRA) 750 MG tablet Take 2 tablets (1,500 mg total) by mouth 2 (two) times daily. 04/06/14  Yes Penni Bombard, MD  acyclovir (ZOVIRAX) 400 MG tablet Take 1 tablet (400 mg total) by mouth 3 (three) times daily. Patient not taking: Reported on 06/06/2014 05/05/14   Hyman Bible, PA-C  lacosamide (VIMPAT) 50 MG TABS tablet Take 1 tablet (50 mg total) by mouth 2 (two) times daily. 07/26/14   Johnna Acosta, MD  naproxen (NAPROSYN) 500 MG tablet Take 1 tablet (500 mg total) by mouth 2 (two) times daily with a meal. Patient not taking: Reported on 06/06/2014 03/15/14   Larene Pickett, PA-C   BP 115/66 mmHg  Pulse 59  Temp(Src) 97.7 F (36.5 C) (Oral)  Resp 12  SpO2 100% Physical Exam  Constitutional: He appears well-developed and well-nourished. No distress.  HENT:  Head: Normocephalic and atraumatic.  Mouth/Throat: Oropharynx is clear and moist. No oropharyngeal exudate.  Shunt palpable on the right lateral and anterior neck  Eyes: Conjunctivae and EOM are normal. Pupils are equal, round, and reactive to light. Right eye exhibits no discharge. Left eye exhibits  no discharge. No scleral icterus.  Neck: Normal range of motion. Neck supple. No JVD present. No thyromegaly present.  Very supple neck, no lymphadenopathy, no stiffness, no trismus or torticollis  Cardiovascular: Normal rate, regular rhythm, normal heart sounds and intact distal pulses.  Exam reveals no gallop and no friction rub.   No murmur heard. Pulmonary/Chest: Effort normal and breath sounds normal. No respiratory distress. He has no wheezes. He has no rales.  Abdominal: Soft. Bowel sounds are normal. He exhibits no distension and no mass. There is no tenderness.  Musculoskeletal: Normal  range of motion. He exhibits no edema or tenderness.  Lymphadenopathy:    He has no cervical adenopathy.  Neurological: He is alert. Coordination normal.  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  Skin: Skin is warm and dry. No rash noted. No erythema.  Psychiatric: He has a normal mood and affect. His behavior is normal.  Nursing note and vitals reviewed.   ED Course  Procedures (including critical care time) Labs Review Labs Reviewed - No data to display  Imaging Review Dg Neck Soft Tissue  07/26/2014   CLINICAL DATA:  Seizure earlier in the day. Ventriculoperitoneal shunt catheter present  EXAM: NECK SOFT TISSUES - 1+ VIEW  COMPARISON:  June 06, 2014 skull and chest radiographs  FINDINGS: Frontal and lateral views were obtained. There is a shunt catheter on the right. There is thickening of the wall of the shunt catheter throughout the neck region with irregular soft tissue opacity surrounding much of the catheter in the neck region. This finding may indicate chronic inflammation surrounding the shunt catheter.  The epiglottis and aryepiglottic folds appear normal. Prevertebral soft tissues are normal. Tongue base region is normal. Tonsils and adenoidal structures appear normal. There is no air-fluid level suggesting abscess. There is postoperative change in the cervical spine with anterior screw and plate fixation at C5 and C6. Bony plug is noted at C5-6.  IMPRESSION: There is thickening along the catheter in the neck region. Similar thickening was seen on prior images from December 2015 in this area. No disruption of the shunt catheter is demonstrable on this study. There is postoperative change in the cervical spine. Study otherwise unremarkable.   Electronically Signed   By: Lowella Grip M.D.   On: 07/26/2014 11:28   Ct Head  Wo Contrast  07/26/2014   CLINICAL DATA:  Initial encounter for seizure this a.m. History seizures. Headache.  EXAM: CT HEAD WITHOUT CONTRAST  TECHNIQUE: Contiguous axial images were obtained from the base of the skull through the vertex without intravenous contrast.  COMPARISON:  06/06/2014  FINDINGS: Sinuses/Soft tissues: Suboccipital craniotomy. Clear paranasal sinuses and mastoid air cells.  Intracranial: Right frontal VP shunt catheter which is similar in position to on the prior exam. Dystrophic calcifications involving the inferior aspect of the falx and left side of the tentorium are similar. No mass lesion, hemorrhage, hydrocephalus, acute infarct, intra-axial, or extra-axial fluid collection.  IMPRESSION: 1.  No acute intracranial abnormality. 2. Similar appearance of suboccipital decompressive craniotomy and VP shunt catheter placement. No hydrocephalus or other acute superimposed process.   Electronically Signed   By: Abigail Miyamoto M.D.   On: 07/26/2014 11:17   Dg Abd Acute W/chest  07/26/2014   CLINICAL DATA:  Initial encounter for seizure.  Evaluate shunt.  EXAM: ACUTE ABDOMEN SERIES (ABDOMEN  2 VIEW & CHEST 1 VIEW)  COMPARISON:  06/06/2014 thoracic spine films. 06/06/2014 chest and abdomen films.  FINDINGS: Frontal view of the chest demonstrates normal heart size and clear lungs. Right-sided VP shunt catheter appears less radiopaque than on the prior exam. This is likely due to differences in technique. Difficult to visualize in the lower chest, without discontinuity identified.  Abdominal films demonstrate a nonobstructive bowel gas pattern. No catheter discontinuity identified. Catheter terminates in the right paracentral pelvis. On the 06/06/2014 exam, terminated in the left lower abdomen.  IMPRESSION: No evidence of VP shunt catheter discontinuity.   Electronically Signed   By: Abigail Miyamoto M.D.   On: 07/26/2014 11:29    ED ECG REPORT  I personally interpreted this EKG   Date: 07/26/2014    Rate: 53  Rhythm: sinus bradycardia  QRS Axis: normal  Intervals: normal  ST/T Wave abnormalities: normal  Conduction Disutrbances:none  Narrative Interpretation:   Old EKG Reviewed: none available   MDM   Final diagnoses:  Headache  Seizure    The patient has normal vital signs, EKG shows mild bradycardia, no other acute findings, he does have a VP shunt which raises the suspicion of dysfunction given headache and recurrent seizure, evaluate with imaging, observe for recurrent seizure in the emergency department, discussed with neurology regarding discharge plan and medication management as he appears to be a maximum dose of Keppra.  D/w Dr. Wilber Bihari of Neuro - agrees with starting Vimpat - recommends, load here, home with 50 bid.  Pt refuses IV meds- PO given  Meds given in ED:  Medications  lacosamide (VIMPAT) 100 mg in sodium chloride 0.9 % 25 mL IVPB (not administered)    New Prescriptions   LACOSAMIDE (VIMPAT) 50 MG TABS TABLET    Take 1 tablet (50 mg total) by mouth 2 (two) times daily.      Johnna Acosta, MD 07/26/14 1233  Johnna Acosta, MD 07/26/14 [phone]

## 2014-07-26 NOTE — ED Notes (Signed)
Bed: WA11 Expected date:  Expected time:  Means of arrival:  Comments: EMS 

## 2014-07-26 NOTE — ED Notes (Signed)
Urine sent to lab 

## 2014-07-26 NOTE — ED Notes (Signed)
Patient transported to CT 

## 2014-08-10 ENCOUNTER — Emergency Department (HOSPITAL_COMMUNITY): Payer: Medicaid Other

## 2014-08-10 ENCOUNTER — Emergency Department (HOSPITAL_COMMUNITY)
Admission: EM | Admit: 2014-08-10 | Discharge: 2014-08-10 | Disposition: A | Discharge status: Home / Self Care | Payer: Medicaid Other | Attending: Emergency Medicine | Admitting: Emergency Medicine

## 2014-08-10 ENCOUNTER — Encounter (HOSPITAL_COMMUNITY): Payer: Self-pay | Admitting: Emergency Medicine

## 2014-08-10 DIAGNOSIS — R51 Headache: Secondary | ICD-10-CM | POA: Diagnosis not present

## 2014-08-10 DIAGNOSIS — R519 Headache, unspecified: Secondary | ICD-10-CM

## 2014-08-10 DIAGNOSIS — Z86011 Personal history of benign neoplasm of the brain: Secondary | ICD-10-CM | POA: Insufficient documentation

## 2014-08-10 DIAGNOSIS — Z79899 Other long term (current) drug therapy: Secondary | ICD-10-CM | POA: Insufficient documentation

## 2014-08-10 DIAGNOSIS — H547 Unspecified visual loss: Secondary | ICD-10-CM | POA: Diagnosis not present

## 2014-08-10 DIAGNOSIS — J45909 Unspecified asthma, uncomplicated: Secondary | ICD-10-CM | POA: Diagnosis not present

## 2014-08-10 DIAGNOSIS — Z8679 Personal history of other diseases of the circulatory system: Secondary | ICD-10-CM | POA: Insufficient documentation

## 2014-08-10 DIAGNOSIS — R42 Dizziness and giddiness: Secondary | ICD-10-CM | POA: Diagnosis not present

## 2014-08-10 DIAGNOSIS — M549 Dorsalgia, unspecified: Secondary | ICD-10-CM | POA: Insufficient documentation

## 2014-08-10 DIAGNOSIS — G40909 Epilepsy, unspecified, not intractable, without status epilepticus: Secondary | ICD-10-CM | POA: Insufficient documentation

## 2014-08-10 DIAGNOSIS — Z880 Allergy status to penicillin: Secondary | ICD-10-CM | POA: Insufficient documentation

## 2014-08-10 DIAGNOSIS — Z982 Presence of cerebrospinal fluid drainage device: Secondary | ICD-10-CM | POA: Diagnosis not present

## 2014-08-10 HISTORY — DX: Unspecified convulsions: R56.9

## 2014-08-10 LAB — CBC WITH DIFFERENTIAL/PLATELET
BASOS ABS: 0 10*3/uL (ref 0.0–0.1)
Basophils Relative: 0 % (ref 0–1)
Eosinophils Absolute: 0.2 10*3/uL (ref 0.0–0.7)
Eosinophils Relative: 3 % (ref 0–5)
HEMATOCRIT: 40.2 % (ref 39.0–52.0)
HEMOGLOBIN: 13.5 g/dL (ref 13.0–17.0)
LYMPHS ABS: 2.7 10*3/uL (ref 0.7–4.0)
LYMPHS PCT: 29 % (ref 12–46)
MCH: 26.3 pg (ref 26.0–34.0)
MCHC: 33.6 g/dL (ref 30.0–36.0)
MCV: 78.4 fL (ref 78.0–100.0)
MONO ABS: 0.7 10*3/uL (ref 0.1–1.0)
Monocytes Relative: 8 % (ref 3–12)
NEUTROS ABS: 5.4 10*3/uL (ref 1.7–7.7)
Neutrophils Relative %: 60 % (ref 43–77)
Platelets: 212 10*3/uL (ref 150–400)
RBC: 5.13 MIL/uL (ref 4.22–5.81)
RDW: 13.4 % (ref 11.5–15.5)
WBC: 9 10*3/uL (ref 4.0–10.5)

## 2014-08-10 LAB — BASIC METABOLIC PANEL
Anion gap: 11 (ref 5–15)
BUN: 16 mg/dL (ref 6–23)
CALCIUM: 9.2 mg/dL (ref 8.4–10.5)
CO2: 21 mmol/L (ref 19–32)
CREATININE: 1.21 mg/dL (ref 0.50–1.35)
Chloride: 104 mmol/L (ref 96–112)
GFR calc Af Amer: >90 mL/min (ref >90)
GFR, EST NON AFRICAN AMERICAN: 80 mL/min — AB (ref >90)
GLUCOSE: 108 mg/dL — AB (ref 70–99)
POTASSIUM: 3.6 mmol/L (ref 3.5–5.1)
Sodium: 136 mmol/L (ref 135–145)

## 2014-08-10 LAB — URINALYSIS, ROUTINE W REFLEX MICROSCOPIC
Bilirubin Urine: NEGATIVE
Glucose, UA: NEGATIVE mg/dL
Hgb urine dipstick: NEGATIVE
Ketones, ur: 15 mg/dL — AB
LEUKOCYTES UA: NEGATIVE
NITRITE: NEGATIVE
Protein, ur: NEGATIVE mg/dL
SPECIFIC GRAVITY, URINE: 1.027 (ref 1.005–1.030)
Urobilinogen, UA: 1 mg/dL (ref 0.0–1.0)
pH: 7.5 (ref 5.0–8.0)

## 2014-08-10 MED ORDER — DIPHENHYDRAMINE HCL 50 MG/ML IJ SOLN
25.0000 mg | Freq: Once | INTRAMUSCULAR | Status: AC
Start: 2014-08-10 — End: 2014-08-10
  Administered 2014-08-10: 25 mg via INTRAVENOUS
  Filled 2014-08-10: qty 1

## 2014-08-10 MED ORDER — SODIUM CHLORIDE 0.9 % IV BOLUS (SEPSIS)
1000.0000 mL | INTRAVENOUS | Status: AC
  Administered 2014-08-10: 1000 mL via INTRAVENOUS

## 2014-08-10 MED ORDER — METOCLOPRAMIDE HCL 5 MG/ML IJ SOLN
10.0000 mg | Freq: Once | INTRAMUSCULAR | Status: AC
  Administered 2014-08-10: 10 mg via INTRAVENOUS
  Filled 2014-08-10: qty 2

## 2014-08-10 MED ORDER — LORAZEPAM 2 MG/ML IJ SOLN
0.5000 mg | Freq: Once | INTRAMUSCULAR | Status: AC
  Administered 2014-08-10: 0.5 mg via INTRAVENOUS

## 2014-08-10 MED ORDER — HYDROMORPHONE HCL 1 MG/ML IJ SOLN: 1.0000 mg | Freq: Once | INTRAMUSCULAR | Status: DC

## 2014-08-10 MED ORDER — LEVETIRACETAM IN NACL 500 MG/100ML IV SOLN
500.0000 mg | Freq: Once | INTRAVENOUS | Status: AC
  Administered 2014-08-10: 500 mg via INTRAVENOUS
  Filled 2014-08-10: qty 100

## 2014-08-10 MED ORDER — ACETAMINOPHEN 325 MG PO TABS: 650.0000 mg | ORAL_TABLET | Freq: Once | ORAL | Status: DC

## 2014-08-10 MED ORDER — LORAZEPAM 2 MG/ML IJ SOLN
INTRAMUSCULAR | Status: AC
  Filled 2014-08-10: qty 1

## 2014-08-10 NOTE — ED Notes (Signed)
Bed: [facility] Expected date:  Expected time:  Means of arrival:  Comments: EMS-headache

## 2014-08-10 NOTE — ED Notes (Signed)
Pt was at work, complaint of headache, low back pain, and dizziness. Pt states last time he had these symptoms at work it lead to a seizure. Pt has hx of a VP shunt for a brain tumor, and is on seizure medications at home.

## 2014-08-10 NOTE — ED Notes (Signed)
Patient transported to X-ray 

## 2014-08-10 NOTE — ED Provider Notes (Signed)
CSN: [id number]     Arrival date & time 08/10/14  1239 History   First MD Initiated Contact with Patient 08/10/14 1255     Chief Complaint  Patient presents with  . Headache  . Dizziness  . Back Pain     (Consider location/radiation/quality/duration/timing/severity/associated sxs/prior Treatment) Patient is a 29 y.o. male presenting with headaches, dizziness, and back pain. The history is provided by the patient.  Headache Pain location:  Frontal Quality:  Dull Radiates to:  Does not radiate Severity currently:  10/10 Severity at highest:  10/10 Onset quality:  Gradual Timing:  Constant Progression:  Unchanged Chronicity:  Chronic Similar to prior headaches: yes   Context comment:  Awoke w/ the HA this morning Relieved by:  Nothing Worsened by:  Nothing Ineffective treatments:  None tried Associated symptoms: back pain and dizziness   Associated symptoms: no abdominal pain, no cough, no diarrhea, no eye pain, no fever, no nausea, no neck pain, no numbness and no vomiting   Dizziness Associated symptoms: headaches   Associated symptoms: no chest pain, no diarrhea, no nausea, no shortness of breath and no vomiting   Back Pain Associated symptoms: headaches   Associated symptoms: no abdominal pain, no chest pain, no dysuria, no fever and no numbness     Past Medical History  Diagnosis Date  . Asthma   . Seizure   . Migraine   . Vision loss   . Hydrocephalus   . Seizures    Past Surgical History  Procedure Laterality Date  . Brain surgery  age 89    tumor  . Spinal fusion  2011   Family History  Problem Relation Age of Onset  . Hypertension Mother   . Seizures Mother   . Cataracts Mother   . Stroke Father   . Scoliosis Sister   . Vision loss Sister   . Vision loss Brother   . Diabetes Brother   . Leukemia Brother    History  Substance Use Topics  . Smoking status: Never Smoker   . Smokeless tobacco: Never Used  . Alcohol Use: No    Review of Systems   Constitutional: Negative for fever.  HENT: Negative for drooling and rhinorrhea.   Eyes: Negative for pain.  Respiratory: Negative for cough and shortness of breath.   Cardiovascular: Negative for chest pain and leg swelling.  Gastrointestinal: Negative for nausea, vomiting, abdominal pain and diarrhea.  Genitourinary: Negative for dysuria and hematuria.  Musculoskeletal: Positive for back pain. Negative for gait problem and neck pain.  Skin: Negative for color change.  Neurological: Positive for dizziness and headaches. Negative for numbness.  Hematological: Negative for adenopathy.  Psychiatric/Behavioral: Negative for behavioral problems.  All other systems reviewed and are negative.     Allergies  Shellfish allergy; Amoxicillin; and Vancomycin  Home Medications   Prior to Admission medications   Medication Sig Start Date End Date Taking? Authorizing Provider  acyclovir (ZOVIRAX) 400 MG tablet Take 1 tablet (400 mg total) by mouth 3 (three) times daily. Patient not taking: Reported on 06/06/2014 05/05/14   Hyman Bible, PA-C  lacosamide (VIMPAT) 50 MG TABS tablet Take 1 tablet (50 mg total) by mouth 2 (two) times daily. 07/26/14   Johnna Acosta, MD  levETIRAcetam (KEPPRA) 750 MG tablet Take 2 tablets (1,500 mg total) by mouth 2 (two) times daily. 04/06/14   Penni Bombard, MD  naproxen (NAPROSYN) 500 MG tablet Take 1 tablet (500 mg total) by mouth 2 (two) times daily  with a meal. Patient not taking: Reported on 06/06/2014 03/15/14   Larene Pickett, PA-C   BP 107/67 mmHg  Pulse 65  Temp(Src) 97.5 F (36.4 C) (Oral)  Resp 16  SpO2 98% Physical Exam  Constitutional: He is oriented to person, place, and time. He appears well-developed and well-nourished.  HENT:  Head: Normocephalic and atraumatic.  Right Ear: External ear normal.  Left Ear: External ear normal.  Nose: Nose normal.  Mouth/Throat: Oropharynx is clear and moist. No oropharyngeal exudate.  Normal-appearing  shunt in the right. Lateral area without tenderness or evidence of surrounding erythema.  Eyes: Conjunctivae and EOM are normal. Pupils are equal, round, and reactive to light.  Neck: Normal range of motion. Neck supple.  Normal range of motion of the neck.  Cardiovascular: Normal rate, regular rhythm, normal heart sounds and intact distal pulses.  Exam reveals no gallop and no friction rub.   No murmur heard. Pulmonary/Chest: Effort normal and breath sounds normal. No respiratory distress. He has no wheezes.  Abdominal: Soft. Bowel sounds are normal. He exhibits no distension. There is no tenderness. There is no rebound and no guarding.  Musculoskeletal: Normal range of motion. He exhibits no edema or tenderness.  No focal tenderness of back.  Neurological: He is alert and oriented to person, place, and time.  alert, oriented x3 speech: normal in context and clarity memory: intact grossly cranial nerves II-XII: intact motor strength: full proximally and distally no involuntary movements or tremors sensation: intact to light touch diffusely  cerebellar: finger-to-nose intact Gait: normal forwards and backwards   Skin: Skin is warm and dry.  Psychiatric: He has a normal mood and affect. His behavior is normal.  Nursing note and vitals reviewed.   ED Course  Procedures (including critical care time) Labs Review Labs Reviewed  BASIC METABOLIC PANEL - Abnormal; Notable for the following:    Glucose, Bld 108 (*)    GFR calc non Af Amer 80 (*)    All other components within normal limits  URINALYSIS, ROUTINE W REFLEX MICROSCOPIC - Abnormal; Notable for the following:    Ketones, ur 15 (*)    All other components within normal limits  CBC WITH DIFFERENTIAL/PLATELET    Imaging Review Dg Skull 1-3 Views  08/10/2014   CLINICAL DATA:  Check shunt status.  Headache.  EXAM: SKULL - 1-3 VIEW  COMPARISON:  07/26/2014, 06/06/2014  FINDINGS: Right ventriculostomy catheter tip is near  midline. Visualized paranasal sinuses are well aerated. Orbits are unremarkable in appearance. Visualized portion of the shunt is intact. Note is made of thickened calvarium. Patient has had previous cervical fusion.  IMPRESSION: 1. Right ventriculostomy catheter.  Visualized portion intact. 2.  No evidence for acute  abnormality. .   Electronically Signed   By: Nolon Nations M.D.   On: 08/10/2014 14:22   Dg Chest 1 View  08/10/2014   CLINICAL DATA:  Headache and VP shunt  EXAM: CHEST  1 VIEW; ABDOMEN - 1 VIEW  COMPARISON:  Acute abdominal series 128 2016  FINDINGS: There is extensive dystrophic calcification around a VP shunt catheter in the lower right neck, without progression or visible discontinuity. The catheter tip terminates in the central pelvis.  No evidence of active disease in the chest, including pneumonia, edema, effusion, or pneumothorax. Heart size is normal. Bowel gas pattern is normal.  IMPRESSION: 1. No evidence of VP shunt discontinuity from the neck to pelvis. 2. Extensive dystrophic calcification about the catheter in the right neck.  Electronically Signed   By: Monte Fantasia M.D.   On: 08/10/2014 14:24   Dg Abd 1 View  08/10/2014   CLINICAL DATA:  Headache and VP shunt  EXAM: CHEST  1 VIEW; ABDOMEN - 1 VIEW  COMPARISON:  Acute abdominal series 128 2016  FINDINGS: There is extensive dystrophic calcification around a VP shunt catheter in the lower right neck, without progression or visible discontinuity. The catheter tip terminates in the central pelvis.  No evidence of active disease in the chest, including pneumonia, edema, effusion, or pneumothorax. Heart size is normal. Bowel gas pattern is normal.  IMPRESSION: 1. No evidence of VP shunt discontinuity from the neck to pelvis. 2. Extensive dystrophic calcification about the catheter in the right neck.   Electronically Signed   By: Monte Fantasia M.D.   On: 08/10/2014 14:24   Ct Head Wo Contrast  08/10/2014   CLINICAL DATA:   Headaches with ventriculoperitoneal shunt in place  EXAM: CT HEAD WITHOUT CONTRAST  TECHNIQUE: Contiguous axial images were obtained from the base of the skull through the vertex without intravenous contrast.  COMPARISON:  July 26, 2014  FINDINGS: There is again noted a ventriculoperitoneal shunt catheter placed from a right frontal approach. The tip of the catheter is in the midline between the lateral ventricles. There is complete decompression of the right lateral ventricle. The left lateral ventricle is nonenlarged and stable. The third ventricle remains slit-like. Fourth ventricle is mildly prominent but stable. There is again noted dystrophic type calcification in the region of the tentorium in the midline, stable. There is no intracranial mass, hemorrhage, extra-axial fluid collection, or midline shift. The gray-white compartments appear unremarkable. No new gray-white compartment lesion. No acute infarct apparent.  The bony calvarium shows evidence of previa shunt catheter placements as well as suboccipital craniotomy. There is no blastic or lytic bone lesion. No acute fracture. Visualized mastoid air cells are clear.  IMPRESSION: No change compared to recent prior study. Shunt catheter position unchanged without change in the ventricles. No gray-white compartment lesions. Stable dystrophic calcifications posteriorly in the midline tentorium region. No hemorrhage or mass effect.   Electronically Signed   By: Lowella Grip III M.D.   On: 08/10/2014 14:45     EKG Interpretation None      MDM   Final diagnoses:  VP (ventriculoperitoneal) shunt status  Headache    1:32 PM 29 y.o. 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 Prior to my evaluation of the patient he had a seizure. The nurse tech states that he was using the bathroom when he lowered himself to the ground and began  shaking all over. Episode lasted about 30 seconds. I did not witness it although he appears postictal on my examination. He does have a history of seizures. We'll get screening labs and imaging.  2:52 PM patient now more alert stating that he has had a frontal headache since he awoke this morning. He states that he has daily headaches and this is consistent with that. He has not taken anything. He also complains of some lower back pain which he has with the headaches. He states that he also typically gets headaches after he has a seizure and this headache now is consistent with that. He denies any fevers or head injuries recently and otherwise appears well on exam. Pt ntoes he has not missed his seizure meds latetly.  4:04 PM: HA mildly improved.  I have discussed the diagnosis/risks/treatment options with the patient and family and believe the pt to be eligible for discharge home to follow-up with Dr. Hal Neer as needed. We also discussed returning to the ED immediately if new or worsening sx occur. We discussed the sx which are most concerning (e.g., worsening HA, fever, recurrent seizures) that necessitate immediate return. Medications administered to the patient during their visit and any new prescriptions provided to the patient are listed below.  Medications given during this visit Medications  LORazepam (ATIVAN) 2 MG/ML injection (not administered)  acetaminophen (TYLENOL) tablet 650 mg (not administered)  HYDROmorphone (DILAUDID) injection 1 mg (not administered)  sodium chloride 0.9 % bolus 1,000 mL (1,000 mLs Intravenous New Bag/Given 08/10/14 1338)  metoCLOPramide (REGLAN) injection 10 mg (10 mg Intravenous Given 08/10/14 1339)  diphenhydrAMINE (BENADRYL) injection 25 mg (25 mg Intravenous Given 08/10/14 1339)  LORazepam (ATIVAN) injection 0.5 mg (0.5 mg Intravenous Given 08/10/14 1333)  levETIRAcetam (KEPPRA) IVPB 500 mg/100 mL premix (500 mg Intravenous Given 08/10/14 1428)    New  Prescriptions   No medications on file     Pamella Pert, MD 08/10/14 1606

## 2014-08-10 NOTE — ED Notes (Signed)
Lab draw delayed, pt not in rm

## 2014-08-20 ENCOUNTER — Ambulatory Visit (INDEPENDENT_AMBULATORY_CARE_PROVIDER_SITE_OTHER): Payer: Medicaid Other | Admitting: Diagnostic Neuroimaging

## 2014-08-20 ENCOUNTER — Ambulatory Visit: Payer: Self-pay | Admitting: Diagnostic Neuroimaging

## 2014-08-20 ENCOUNTER — Encounter: Payer: Self-pay | Admitting: Diagnostic Neuroimaging

## 2014-08-20 VITALS — BP 131/78 | HR 62 | Ht 72.0 in | Wt 199.2 lb

## 2014-08-20 DIAGNOSIS — Z982 Presence of cerebrospinal fluid drainage device: Secondary | ICD-10-CM

## 2014-08-20 DIAGNOSIS — G40909 Epilepsy, unspecified, not intractable, without status epilepticus: Secondary | ICD-10-CM | POA: Diagnosis not present

## 2014-08-20 DIAGNOSIS — Z981 Arthrodesis status: Secondary | ICD-10-CM | POA: Diagnosis not present

## 2014-08-20 DIAGNOSIS — G40309 Generalized idiopathic epilepsy and epileptic syndromes, not intractable, without status epilepticus: Secondary | ICD-10-CM

## 2014-08-20 MED ORDER — LACOSAMIDE 100 MG PO TABS: 100.0000 mg | ORAL_TABLET | Freq: Two times a day (BID) | ORAL | Status: DC

## 2014-08-20 MED ORDER — LEVETIRACETAM 750 MG PO TABS: 1500.0000 mg | ORAL_TABLET | Freq: Two times a day (BID) | ORAL | Status: DC

## 2014-08-20 NOTE — Patient Instructions (Signed)
Continue levetiracetam.  Increase vimpat to 100mg  twice a day.

## 2014-08-20 NOTE — Progress Notes (Signed)
[facility]  PATIENT: Don Reed DOB: December 08, 1985  REFERRING CLINICIAN: Steinl HISTORY FROM: patient  REASON FOR VISIT: follow up    HISTORICAL  CHIEF COMPLAINT:  Chief Complaint  Patient presents with  . Follow-up    seizures     HISTORY OF PRESENT ILLNESS:   UPDATE 08/20/14: Since last visit, patient has had additional seizures in Jan and Feb 2016. No missed doses or triggering factors. In Jan 2015, vimpat 50mg  BID was added. In Feb 2016, no sz med changes were made. Also struggling with medicaid transfer from [place] to [place]; trying to get established with PCP here.  PRIOR HPI (04/06/14): 29 year old right-handed male here for evaluation of seizures. Age 29 years old patient had slurred speech, multiple syncopal events, diagnosed with brain tumor (benign). Patient had tumor resection and shunt placement. 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 In 1999 patient had shunt revision. 2010 patient had first seizure of life, consisting of loss of consciousness, convulsions, incontinence, sore muscles. No tongue biting. Sometimes patient would have warning of a "dry taste in mouth". This was started on Depakote initially but then transitioned to [place] for better seizure control. Patient continues to have seizures once every one to 3 months. Patient was living in [place] until last year when he moved to [place], [place]. One month ago patient with [facility]. Last seizure was in August 2015. Patient had run out of medications in the emergency room for medication refill. Also patient has history of cervical spine fusion surgery in 2011. Patient has chronic neck pain radiating to the bilateral upper extremities.   REVIEW OF SYSTEMS: Full 14 system review of systems performed and notable only for food allergies memory loss headache numbness seizure joint pain back pain aching muscles muscle cramps walking difficulty  neck pain neck stiffness.   ALLERGIES: Allergies  Allergen Reactions  . Shellfish Allergy Anaphylaxis    Cannot breathe   . Amoxicillin Itching  . Vancomycin Itching    HOME MEDICATIONS: Outpatient Prescriptions Prior to Visit  Medication Sig Dispense Refill  . lacosamide (VIMPAT) 50 MG TABS tablet Take 1 tablet (50 mg total) by mouth 2 (two) times daily. 60 tablet 1  . levETIRAcetam (KEPPRA) 750 MG tablet Take 2 tablets (1,500 mg total) by mouth 2 (two) times daily. 120 tablet 12  . naproxen (NAPROSYN) 500 MG tablet Take 1 tablet (500 mg total) by mouth 2 (two) times daily with a meal. (Patient not taking: Reported on 08/20/2014) 30 tablet 0  . acyclovir (ZOVIRAX) 400 MG tablet Take 1 tablet (400 mg total) by mouth 3 (three) times daily. (Patient not taking: Reported on 06/06/2014) 30 tablet 0   No facility-administered medications prior to visit.    PAST MEDICAL HISTORY: Past Medical History  Diagnosis Date  . Asthma   . Seizure   . Migraine   . Vision loss   . Hydrocephalus   . Seizures     PAST SURGICAL HISTORY: Past Surgical History  Procedure Laterality Date  . Brain surgery  age 54    tumor  . Spinal fusion  2011    FAMILY HISTORY: Family History  Problem Relation Age of Onset  . Hypertension Mother   . Seizures Mother   . Cataracts Mother   . Stroke Father   . Scoliosis Sister   . Vision loss Sister   . Vision loss Brother   . Diabetes Brother   . Leukemia Brother     SOCIAL  HISTORY:  History   Social History  . Marital Status: Legally Separated    Spouse Name: N/A  . Number of Children: 2  . Years of Education: GED   Occupational History  .  Industries [employer]   Social History Main Topics  . Smoking status: Never Smoker   . Smokeless tobacco: Never Used  . Alcohol Use: No  . Drug Use: No  . Sexual Activity: Not on file   Other Topics Concern  . Not on file   Social History Narrative   Patient lives at home with family.   Caffeine  Use: 1-3 time weekly     PHYSICAL EXAM  Filed Vitals:   08/20/14 1542  BP: 131/78  Pulse: 62  Height: 6' (1.829 m)  Weight: 199 lb 3.2 oz (90.357 kg)    Not recorded      Body mass index is 27.01 kg/(m^2).  GENERAL EXAM: Patient is in no distress; well developed, nourished and groomed; neck is supple  CARDIOVASCULAR: Regular rate and rhythm, no murmurs, no carotid bruits  NEUROLOGIC: MENTAL STATUS: awake, alert, language fluent, comprehension intact, naming intact, fund of knowledge appropriate CRANIAL NERVE: no papilledema on fundoscopic exam; RIGHT EYE MINIMAL LIGHT/MOTION PERCEPTION; LEFT EYE CAN COUNT FINGERS; pupils equal and reactive to light, visual fields full to confrontation IN LEFT EYE, extraocular muscles intact, NYSTAGMUS ON RIGHT GAZE, facial sensation and strength symmetric, hearing intact, palate elevates symmetrically, uvula midline, shoulder shrug symmetric, tongue midline. MOTOR: normal bulk and tone, full strength in the BUE, BLE SENSORY: normal and symmetric to light touch, temperature, vibration  COORDINATION: finger-nose-finger, fine finger movements normal REFLEXES: deep tendon reflexes present and symmetric GAIT/STATION: narrow based gait; romberg is negative    DIAGNOSTIC DATA (LABS, IMAGING, TESTING) - I reviewed patient records, labs, notes, testing and imaging myself where available.  Lab Results  Component Value Date   WBC 9.0 08/10/2014      Component Value Date/Time   NA 136 08/10/2014 1348   No results found for: CHOL No results found for: HGBA1C No results found for: VITAMINB12 No results found for: TSH  I reviewed images myself and agree with interpretation. -VRP  07/26/14 CT head  1. No acute intracranial abnormality. 2. Similar appearance of suboccipital decompressive craniotomy and VP shunt catheter placement. No hydrocephalus or other acute superimposed process.  08/10/14 CT head - No change compared to recent prior  study. Shunt catheter position unchanged without change in the ventricles. No gray-white compartment lesions. Stable dystrophic calcifications posteriorly in the midline tentorium region. No hemorrhage or mass effect.    ASSESSMENT AND PLAN  29 y.o. year old year old male here with history of benign brain tumor, hydrocephalus, status post resection and shunt placement, with seizure disorder since 2010.   PLAN: - continue LEV 1500mg  BID - increase vimpat to 100mg  BID for better seizure control.  - patient to followup with PCP and neurosurgery regarding cervical spine surgery and neck pain and pain managment  Meds ordered this encounter  Medications  . Lacosamide 100 MG TABS    Sig: Take 1 tablet (100 mg total) by mouth 2 (two) times daily.    Dispense:  180 tablet    Refill:  4  . levETIRAcetam (KEPPRA) 750 MG tablet    Sig: Take 2 tablets (1,500 mg total) by mouth 2 (two) times daily.    Dispense:  360 tablet    Refill:  4   Return in about 3 months (around 11/18/2014).  Penni Bombard, MD 07/19/9756, 8:32 PM Certified in Neurology, Neurophysiology and Neuroimaging  [facility] [address] [place], [place] [address] [phone]

## 2014-09-03 ENCOUNTER — Telehealth: Payer: Self-pay | Admitting: Diagnostic Neuroimaging

## 2014-09-03 NOTE — Telephone Encounter (Signed)
Pt is calling stating that he lost the written Rx for Lacosamide 100 MG TABS. He needs this to be called in at [facility] on [address].  Please advise.

## 2014-09-03 NOTE — Telephone Encounter (Signed)
Rx has been provided to pharmacy.

## 2014-09-05 ENCOUNTER — Emergency Department (HOSPITAL_COMMUNITY)
Admission: EM | Admit: 2014-09-05 | Discharge: 2014-09-05 | Disposition: A | Discharge status: Home / Self Care | Payer: Medicaid Other | Attending: Emergency Medicine | Admitting: Emergency Medicine

## 2014-09-05 ENCOUNTER — Encounter (HOSPITAL_COMMUNITY): Payer: Self-pay

## 2014-09-05 DIAGNOSIS — Z8679 Personal history of other diseases of the circulatory system: Secondary | ICD-10-CM | POA: Insufficient documentation

## 2014-09-05 DIAGNOSIS — Z880 Allergy status to penicillin: Secondary | ICD-10-CM | POA: Insufficient documentation

## 2014-09-05 DIAGNOSIS — Z791 Long term (current) use of non-steroidal anti-inflammatories (NSAID): Secondary | ICD-10-CM | POA: Insufficient documentation

## 2014-09-05 DIAGNOSIS — Z79899 Other long term (current) drug therapy: Secondary | ICD-10-CM | POA: Insufficient documentation

## 2014-09-05 DIAGNOSIS — J45909 Unspecified asthma, uncomplicated: Secondary | ICD-10-CM | POA: Insufficient documentation

## 2014-09-05 DIAGNOSIS — M545 Low back pain: Secondary | ICD-10-CM | POA: Insufficient documentation

## 2014-09-05 DIAGNOSIS — G40909 Epilepsy, unspecified, not intractable, without status epilepticus: Secondary | ICD-10-CM | POA: Diagnosis not present

## 2014-09-05 MED ORDER — CYCLOBENZAPRINE HCL 10 MG PO TABS: 10.0000 mg | ORAL_TABLET | Freq: Three times a day (TID) | ORAL | Status: DC | PRN

## 2014-09-05 MED ORDER — HYDROCODONE-ACETAMINOPHEN 5-325 MG PO TABS: 1.0000 | ORAL_TABLET | ORAL | Status: DC | PRN

## 2014-09-05 MED ORDER — IBUPROFEN 600 MG PO TABS: 600.0000 mg | ORAL_TABLET | Freq: Three times a day (TID) | ORAL | Status: DC | PRN

## 2014-09-05 NOTE — ED Notes (Signed)
He states that today, while at work, he began to experience neck and upper back discomfort.  He states he has this occasionally since having spinal fusion some years ago.  He states also that sometimes when he has these symptoms it is a harbinger of a seizure.  He states this occurred some 6 weeks ago, at which time he had a seizure and Vimpat was added to he medication regimen.  He is alert and in no distress.

## 2014-09-05 NOTE — Discharge Instructions (Signed)

## 2014-09-05 NOTE — ED Notes (Signed)
Bed: [facility] Expected date:  Expected time:  Means of arrival:  Comments: seizure

## 2014-09-05 NOTE — ED Provider Notes (Signed)
CSN: [id number]     Arrival date & time 09/05/14  1409 History   First MD Initiated Contact with Patient 09/05/14 1505     Chief Complaint  Patient presents with  . Neck Pain     HPI Patient reports increasing low back pain over the past several days.  He has long-standing history of intermittent low back pain.  He is new to the area and is currently developing a relationship with a primary care physician.  No recent injury or trauma.  No fevers or chills.  He does do lifting at work.  Denies weight loss.  No bowel or bladder incontinence.  No weakness of his arms or legs.  Symptoms are mild to moderate in severity.  He's had no anterior cervical fusion before for a herniated disc.  This was performed in [facility].   Past Medical History  Diagnosis Date  . Asthma   . Seizure   . Migraine   . Vision loss   . Hydrocephalus   . Seizures    Past Surgical History  Procedure Laterality Date  . Brain surgery  age 26    tumor  . Spinal fusion  2011   Family History  Problem Relation Age of Onset  . Hypertension Mother   . Seizures Mother   . Cataracts Mother   . Stroke Father   . Scoliosis Sister   . Vision loss Sister   . Vision loss Brother   . Diabetes Brother   . Leukemia Brother    History  Substance Use Topics  . Smoking status: Never Smoker   . Smokeless tobacco: Never Used  . Alcohol Use: No    Review of Systems  All other systems reviewed and are negative.     Allergies  Shellfish allergy; Amoxicillin; and Vancomycin  Home Medications   Prior to Admission medications   Medication Sig Start Date End Date Taking? Authorizing Provider  Lacosamide 100 MG TABS Take 1 tablet (100 mg total) by mouth 2 (two) times daily. 08/20/14  Yes Penni Bombard, MD  levETIRAcetam (KEPPRA) 750 MG tablet Take 2 tablets (1,500 mg total) by mouth 2 (two) times daily. 08/20/14  Yes Penni Bombard, MD  naproxen (NAPROSYN) 500 MG tablet Take 1 tablet (500 mg  total) by mouth 2 (two) times daily with a meal. Patient not taking: Reported on 08/20/2014 03/15/14   Larene Pickett, PA-C   BP 116/66 mmHg  Pulse 72  Temp(Src) 98.1 F (36.7 C) (Oral)  Resp 15  SpO2 100% Physical Exam  Constitutional: He is oriented to person, place, and time. He appears well-developed and well-nourished.  HENT:  Head: Normocephalic and atraumatic.  Eyes: EOM are normal.  Neck: Normal range of motion.  Cardiovascular: Normal rate, regular rhythm, normal heart sounds and intact distal pulses.   Pulmonary/Chest: Effort normal and breath sounds normal. No respiratory distress.  Abdominal: Soft. He exhibits no distension. There is no tenderness.  Musculoskeletal: Normal range of motion.  No thoracic or lumbar point tenderness.  Mild paralumbar tenderness with mild spasm left greater than right.  Neurological: He is alert and oriented to person, place, and time.  Skin: Skin is warm and dry.  Psychiatric: He has a normal mood and affect. Judgment normal.  Nursing note and vitals reviewed.   ED Course  Procedures (including critical care time) Labs Review Labs Reviewed - No data to display  Imaging Review No results found.   EKG Interpretation None  MDM   Final diagnoses:  None   Musculoskeletal low back pain.  Home with a short course of anti-inflammatories and muscle relaxants as well as breakthrough Norco.    Jola Schmidt, MD 09/05/14 1530

## 2014-11-19 ENCOUNTER — Ambulatory Visit (INDEPENDENT_AMBULATORY_CARE_PROVIDER_SITE_OTHER): Payer: Medicaid Other | Admitting: Diagnostic Neuroimaging

## 2014-11-19 ENCOUNTER — Encounter: Payer: Self-pay | Admitting: Diagnostic Neuroimaging

## 2014-11-19 VITALS — BP 123/77 | HR 62 | Ht 72.0 in | Wt 217.2 lb

## 2014-11-19 DIAGNOSIS — Z981 Arthrodesis status: Secondary | ICD-10-CM | POA: Diagnosis not present

## 2014-11-19 DIAGNOSIS — G40909 Epilepsy, unspecified, not intractable, without status epilepticus: Secondary | ICD-10-CM | POA: Diagnosis not present

## 2014-11-19 DIAGNOSIS — Z982 Presence of cerebrospinal fluid drainage device: Secondary | ICD-10-CM

## 2014-11-19 DIAGNOSIS — G40309 Generalized idiopathic epilepsy and epileptic syndromes, not intractable, without status epilepticus: Secondary | ICD-10-CM

## 2014-11-19 MED ORDER — LEVETIRACETAM 750 MG PO TABS: 1500.0000 mg | ORAL_TABLET | Freq: Two times a day (BID) | ORAL | Status: DC

## 2014-11-19 MED ORDER — LACOSAMIDE 100 MG PO TABS: 100.0000 mg | ORAL_TABLET | Freq: Two times a day (BID) | ORAL | Status: DC

## 2014-11-19 NOTE — Progress Notes (Signed)
[facility]  PATIENT: Don Reed DOB: [birth year]  REFERRING CLINICIAN: Steinl HISTORY FROM: patient and girlfriend REASON FOR VISIT: follow up    HISTORICAL  CHIEF COMPLAINT:  Chief Complaint  Patient presents with  . Follow-up    seizure disorder     HISTORY OF PRESENT ILLNESS:   UPDATE 11/19/14: Since last visit, has had 1 possible minor seizure (was sitting in chair, gradually slid out of chair to lay on ground, no shaking, was responsive with girlfriend). No convulsive seizures since last visit.   UPDATE 08/20/14: Since last visit, patient has had additional seizures in Jan and Feb 2016. No missed doses or triggering factors. In Jan 2015, vimpat 50mg  BID was added. In Feb 2016, no sz med changes were made. Also struggling with medicaid transfer from [place] to [place]; trying to get established with PCP here.  PRIOR HPI (04/06/14): 29 year old right-handed male here for evaluation of seizures. Age 20 years old patient had slurred speech, multiple syncopal events, diagnosed with brain tumor (benign). Patient had tumor resection and shunt placement. 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 In 1999 patient had shunt revision. 2010 patient had first seizure of life, consisting of loss of consciousness, convulsions, incontinence, sore muscles. No tongue biting. Sometimes patient would have warning of a "dry taste in mouth". This was started on Depakote initially but then transitioned to [place] for better seizure control. Patient continues to have seizures once every one to 3 months. Patient was living in [place] until last year when he moved to [place], [place]. One month ago patient with [facility]. Last seizure was in August 2015. Patient had run out of medications in the emergency room for medication refill. Also patient has history of cervical spine fusion surgery in 2011. Patient has chronic neck pain radiating  to the bilateral upper extremities.   REVIEW OF SYSTEMS: Full 14 system review of systems performed and notable only for eye pain seizure back pain neck pain.     ALLERGIES: Allergies  Allergen Reactions  . Shellfish Allergy Anaphylaxis    Cannot breathe   . Amoxicillin Itching  . Vancomycin Itching    HOME MEDICATIONS: Outpatient Prescriptions Prior to Visit  Medication Sig Dispense Refill  . cyclobenzaprine (FLEXERIL) 10 MG tablet Take 1 tablet (10 mg total) by mouth 3 (three) times daily as needed for muscle spasms. 20 tablet 0  . HYDROcodone-acetaminophen (NORCO/VICODIN) 5-325 MG per tablet Take 1 tablet by mouth every 4 (four) hours as needed for moderate pain. 15 tablet 0  . ibuprofen (ADVIL,MOTRIN) 600 MG tablet Take 1 tablet (600 mg total) by mouth every 8 (eight) hours as needed. 15 tablet 0  . Lacosamide 100 MG TABS Take 1 tablet (100 mg total) by mouth 2 (two) times daily. 180 tablet 4  . levETIRAcetam (KEPPRA) 750 MG tablet Take 2 tablets (1,500 mg total) by mouth 2 (two) times daily. 360 tablet 4   No facility-administered medications prior to visit.    PAST MEDICAL HISTORY: Past Medical History  Diagnosis Date  . Asthma   . Seizure   . Migraine   . Vision loss   . Hydrocephalus   . Seizures     PAST SURGICAL HISTORY: Past Surgical History  Procedure Laterality Date  . Brain surgery  age 27    tumor  . Spinal fusion  2011    FAMILY HISTORY: Family History  Problem Relation Age of Onset  . Hypertension Mother   . Seizures  Mother   . Cataracts Mother   . Stroke Father   . Scoliosis Sister   . Vision loss Sister   . Vision loss Brother   . Diabetes Brother   . Leukemia Brother     SOCIAL HISTORY:  History   Social History  . Marital Status: Legally Separated    Spouse Name: N/A  . Number of Children: 2  . Years of Education: GED   Occupational History  .  Industries [employer]   Social History Main Topics  . Smoking status: Never  Smoker   . Smokeless tobacco: Never Used  . Alcohol Use: No  . Drug Use: No  . Sexual Activity: Not on file   Other Topics Concern  . Not on file   Social History Narrative   Patient lives at home with family.   Caffeine Use: 1-3 time weekly     PHYSICAL EXAM  Filed Vitals:   11/19/14 1021  BP: 123/77  Pulse: 62  Height: 6' (1.829 m)  Weight: 217 lb 3.2 oz (98.521 kg)    Not recorded      Body mass index is 29.45 kg/(m^2).  GENERAL EXAM: Patient is in no distress; well developed, nourished and groomed; neck is supple  CARDIOVASCULAR: Regular rate and rhythm, no murmurs, no carotid bruits  NEUROLOGIC: MENTAL STATUS: awake, alert, language fluent, comprehension intact, naming intact, fund of knowledge appropriate CRANIAL NERVE: no papilledema on fundoscopic exam; RIGHT EYE MINIMAL LIGHT/MOTION PERCEPTION; LEFT EYE CAN COUNT FINGERS; pupils equal and reactive to light, visual fields full to confrontation IN LEFT EYE, extraocular muscles intact, NYSTAGMUS ON RIGHT GAZE, facial sensation and strength symmetric, hearing intact, palate elevates symmetrically, uvula midline, shoulder shrug symmetric, tongue midline. MOTOR: normal bulk and tone, full strength in the BUE, BLE SENSORY: normal and symmetric to light touch, temperature, vibration  COORDINATION: finger-nose-finger, fine finger movements normal REFLEXES: deep tendon reflexes present and symmetric GAIT/STATION: narrow based gait; romberg is negative    DIAGNOSTIC DATA (LABS, IMAGING, TESTING) - I reviewed patient records, labs, notes, testing and imaging myself where available.  Lab Results  Component Value Date   WBC 9.0 08/10/2014      Component Value Date/Time   NA 136 08/10/2014 1348   No results found for: CHOL No results found for: HGBA1C No results found for: VITAMINB12 No results found for: TSH   07/26/14 CT head  1. No acute intracranial abnormality. 2. Similar appearance of suboccipital  decompressive craniotomy and VP shunt catheter placement. No hydrocephalus or other acute superimposed process.  08/10/14 CT head - No change compared to recent prior study. Shunt catheter position unchanged without change in the ventricles. No gray-white compartment lesions. Stable dystrophic calcifications posteriorly in the midline tentorium region. No hemorrhage or mass effect.    ASSESSMENT AND PLAN  29 y.o. year old male here with history of benign brain tumor, hydrocephalus, status post resection and shunt placement, with seizure disorder since 2010.   PLAN: - continue LEV 1500mg  BID - continue vimpat 100mg  BID  - patient to followup with PCP and neurosurgery regarding cervical spine surgery and neck pain and pain managment  Meds ordered this encounter  Medications  . Lacosamide 100 MG TABS    Sig: Take 1 tablet (100 mg total) by mouth 2 (two) times daily.    Dispense:  180 tablet    Refill:  4  . levETIRAcetam (KEPPRA) 750 MG tablet    Sig: Take 2 tablets (1,500 mg total) by  mouth 2 (two) times daily.    Dispense:  360 tablet    Refill:  4   Return in about 6 months (around 05/22/2015).  I spent 15 minutes of face to face time with patient. Greater than 50% of time was spent in counseling and coordination of care with patient.     Penni Bombard, MD 6/85/4883, 01:41 AM Certified in Neurology, Neurophysiology and Neuroimaging  [facility] [address] [place], [place] [address] [phone]

## 2014-11-19 NOTE — Patient Instructions (Signed)
Continue vimpat and levetiracetam.

## 2015-02-11 ENCOUNTER — Emergency Department (HOSPITAL_COMMUNITY)
Admission: EM | Admit: 2015-02-11 | Discharge: 2015-02-11 | Disposition: A | Discharge status: Home / Self Care | Payer: Medicaid Other | Attending: Emergency Medicine | Admitting: Emergency Medicine

## 2015-02-11 DIAGNOSIS — J45909 Unspecified asthma, uncomplicated: Secondary | ICD-10-CM | POA: Insufficient documentation

## 2015-02-11 DIAGNOSIS — G40909 Epilepsy, unspecified, not intractable, without status epilepticus: Secondary | ICD-10-CM | POA: Insufficient documentation

## 2015-02-11 DIAGNOSIS — Z880 Allergy status to penicillin: Secondary | ICD-10-CM | POA: Insufficient documentation

## 2015-02-11 DIAGNOSIS — R569 Unspecified convulsions: Secondary | ICD-10-CM | POA: Diagnosis present

## 2015-02-11 DIAGNOSIS — Z79899 Other long term (current) drug therapy: Secondary | ICD-10-CM | POA: Diagnosis not present

## 2015-02-11 DIAGNOSIS — G43909 Migraine, unspecified, not intractable, without status migrainosus: Secondary | ICD-10-CM | POA: Insufficient documentation

## 2015-02-11 MED ORDER — LACOSAMIDE 50 MG PO TABS
100.0000 mg | ORAL_TABLET | Freq: Once | ORAL | Status: AC
  Administered 2015-02-11: 100 mg via ORAL
  Filled 2015-02-11: qty 2

## 2015-02-11 NOTE — ED Notes (Signed)
Pt verbalizes understanding of d/c instructions and denies any further needs at this time. 

## 2015-02-11 NOTE — ED Notes (Addendum)
To ed via [facility] from [place] office-- was there to for a consult for back injection. Had a seizure , laid on floor-- full body, [pt did not take Vimpat this am.

## 2015-02-11 NOTE — ED Provider Notes (Signed)
CSN: [id number]     Arrival date & time 02/11/15  1434 History   First MD Initiated Contact with Patient 02/11/15 1450     Chief Complaint  Patient presents with  . Seizures      HPI Patient presents the emergency department after a seizure while at the neurosurgical office today.  He has a long-standing history of epilepsy and takes Keppra and Vimpat for this.  He is compliant with his medications over this morning he forgot to take his Vimpat.  He reports no pain or discomfort at this time.  Transient postictal period and now back to baseline per his significant other in the room with him.  Patient is currently being seen and evaluated by neurosurgery for neck pain that is chronic in nature.   Past Medical History  Diagnosis Date  . Asthma   . Seizure   . Migraine   . Vision loss   . Hydrocephalus   . Seizures    Past Surgical History  Procedure Laterality Date  . Brain surgery  age 49    tumor  . Spinal fusion  2011   Family History  Problem Relation Age of Onset  . Hypertension Mother   . Seizures Mother   . Cataracts Mother   . Stroke Father   . Seizures Father   . Scoliosis Sister   . Vision loss Sister   . Vision loss Brother   . Diabetes Brother   . Leukemia Brother    Social History  Substance Use Topics  . Smoking status: Never Smoker   . Smokeless tobacco: Never Used  . Alcohol Use: No    Review of Systems  All other systems reviewed and are negative.     Allergies  Shellfish allergy; Amoxicillin; and Vancomycin  Home Medications   Prior to Admission medications   Medication Sig Start Date End Date Taking? Authorizing Provider  cyclobenzaprine (FLEXERIL) 10 MG tablet Take 1 tablet (10 mg total) by mouth 3 (three) times daily as needed for muscle spasms. 09/05/14   Jola Schmidt, MD  HYDROcodone-acetaminophen (NORCO/VICODIN) 5-325 MG per tablet Take 1 tablet by mouth every 4 (four) hours as needed for moderate pain. 09/05/14   Jola Schmidt, MD   ibuprofen (ADVIL,MOTRIN) 600 MG tablet Take 1 tablet (600 mg total) by mouth every 8 (eight) hours as needed. 09/05/14   Jola Schmidt, MD  Lacosamide 100 MG TABS Take 1 tablet (100 mg total) by mouth 2 (two) times daily. 11/19/14   Penni Bombard, MD  levETIRAcetam (KEPPRA) 750 MG tablet Take 2 tablets (1,500 mg total) by mouth 2 (two) times daily. 11/19/14   Penni Bombard, MD   BP 121/75 mmHg  Pulse 60  Temp(Src) 98.1 F (36.7 C) (Oral)  Resp 14  Ht 6\' 1"  (1.854 m)  Wt 210 lb (95.255 kg)  BMI 27.71 kg/m2  SpO2 98% Physical Exam  Constitutional: He is oriented to person, place, and time. He appears well-developed and well-nourished.  HENT:  Head: Normocephalic and atraumatic.  Eyes: EOM are normal.  Neck: Normal range of motion.  Cardiovascular: Normal rate, regular rhythm, normal heart sounds and intact distal pulses.   Pulmonary/Chest: Effort normal and breath sounds normal. No respiratory distress.  Abdominal: Soft. He exhibits no distension. There is no tenderness.  Musculoskeletal: Normal range of motion.  Neurological: He is alert and oriented to person, place, and time.  Skin: Skin is warm and dry.  Psychiatric: He has a normal mood and affect.  Judgment normal.  Nursing note and vitals reviewed.   ED Course  Procedures (including critical care time) Labs Review Labs Reviewed - No data to display  Imaging Review No results found. I, Duaine Radin M, personally reviewed and evaluated these images and lab results as part of my medical decision-making.   EKG Interpretation   Date/Time:  Monday February 11 2015 14:39:19 EDT Ventricular Rate:  64 PR Interval:  146 QRS Duration: 114 QT Interval:  399 QTC Calculation: 412 R Axis:   65 Text Interpretation:  Sinus rhythm Borderline intraventricular conduction  delay Borderline T abnormalities, inferior leads No significant change was  found Confirmed by Mousa Prout  MD, Bobie Kistler ([id number]) on 02/11/2015 2:48:54 PM      MDM    Final diagnoses:  None    Patient is overall well-appearing.  I suspect this is secondary to missed doses Vimpat this morning as he has had rather well controlled seizures since being initiated on  is in therapy with Vimpat.  Visually given a dose of his Vimpat while here in the emergency department and then he will be discharged to follow-up with his neurologist    Jola Schmidt, MD 02/11/15 [phone]

## 2015-04-09 ENCOUNTER — Telehealth: Payer: Self-pay | Admitting: Diagnostic Neuroimaging

## 2015-04-09 NOTE — Telephone Encounter (Signed)
Pt called and states that for the last week he has been waking up with pain in both eyes. He is currently at optometrist and they do not believe it has to do with this eye.Pt also states that he had a seizure 2 months ago and was at dr. appt with pain management , pt says it might be due to missing a medication dosage . Please call and advise [phone]

## 2015-04-09 NOTE — Telephone Encounter (Signed)
Spoke with patient who states he developed pain behind his eyes approximately 2 weeks ago. He states it no longer occurs every morning and will resolve on it's own. He confirmed his current medications, no further seizures. He states he took OTC med x 1 for eye pain. He states he wants earlier FU but cannot come before Nov 1. Rescheduled his FU from 05/28/15 to 04/30/15.  Patient verbalized understanding, appreciation.

## 2015-04-17 ENCOUNTER — Emergency Department (HOSPITAL_COMMUNITY)
Admission: EM | Admit: 2015-04-17 | Discharge: 2015-04-17 | Disposition: A | Discharge status: Home / Self Care | Payer: Medicaid Other | Attending: Emergency Medicine | Admitting: Emergency Medicine

## 2015-04-17 ENCOUNTER — Encounter (HOSPITAL_COMMUNITY): Payer: Self-pay

## 2015-04-17 ENCOUNTER — Emergency Department (HOSPITAL_COMMUNITY): Payer: Medicaid Other

## 2015-04-17 DIAGNOSIS — J45909 Unspecified asthma, uncomplicated: Secondary | ICD-10-CM | POA: Insufficient documentation

## 2015-04-17 DIAGNOSIS — G40909 Epilepsy, unspecified, not intractable, without status epilepticus: Secondary | ICD-10-CM | POA: Insufficient documentation

## 2015-04-17 DIAGNOSIS — Z8679 Personal history of other diseases of the circulatory system: Secondary | ICD-10-CM | POA: Diagnosis not present

## 2015-04-17 DIAGNOSIS — Z79899 Other long term (current) drug therapy: Secondary | ICD-10-CM | POA: Diagnosis not present

## 2015-04-17 DIAGNOSIS — R51 Headache: Secondary | ICD-10-CM | POA: Insufficient documentation

## 2015-04-17 DIAGNOSIS — Z880 Allergy status to penicillin: Secondary | ICD-10-CM | POA: Insufficient documentation

## 2015-04-17 DIAGNOSIS — R519 Headache, unspecified: Secondary | ICD-10-CM

## 2015-04-17 DIAGNOSIS — Z9889 Other specified postprocedural states: Secondary | ICD-10-CM | POA: Diagnosis not present

## 2015-04-17 DIAGNOSIS — H547 Unspecified visual loss: Secondary | ICD-10-CM | POA: Insufficient documentation

## 2015-04-17 DIAGNOSIS — R200 Anesthesia of skin: Secondary | ICD-10-CM | POA: Diagnosis not present

## 2015-04-17 LAB — CBC WITH DIFFERENTIAL/PLATELET
BASOS PCT: 1 %
Basophils Absolute: 0 10*3/uL (ref 0.0–0.1)
EOS ABS: 0.2 10*3/uL (ref 0.0–0.7)
Eosinophils Relative: 3 %
HCT: 41.6 % (ref 39.0–52.0)
Hemoglobin: 14.2 g/dL (ref 13.0–17.0)
Lymphocytes Relative: 36 %
Lymphs Abs: 2.3 10*3/uL (ref 0.7–4.0)
MCH: 27 pg (ref 26.0–34.0)
MCHC: 34.1 g/dL (ref 30.0–36.0)
MCV: 79.1 fL (ref 78.0–100.0)
Monocytes Absolute: 0.6 10*3/uL (ref 0.1–1.0)
Monocytes Relative: 9 %
Neutro Abs: 3.3 10*3/uL (ref 1.7–7.7)
Neutrophils Relative %: 51 %
Platelets: 221 10*3/uL (ref 150–400)
RBC: 5.26 MIL/uL (ref 4.22–5.81)
RDW: 13.1 % (ref 11.5–15.5)
WBC: 6.4 10*3/uL (ref 4.0–10.5)

## 2015-04-17 LAB — BASIC METABOLIC PANEL
ANION GAP: 8 (ref 5–15)
BUN: 18 mg/dL (ref 6–20)
CALCIUM: 9.7 mg/dL (ref 8.9–10.3)
CO2: 27 mmol/L (ref 22–32)
Chloride: 104 mmol/L (ref 101–111)
Creatinine, Ser: 1.4 mg/dL — ABNORMAL HIGH (ref 0.61–1.24)
Glucose, Bld: 97 mg/dL (ref 65–99)
Potassium: 3.8 mmol/L (ref 3.5–5.1)
Sodium: 139 mmol/L (ref 135–145)

## 2015-04-17 MED ORDER — KETOROLAC TROMETHAMINE 30 MG/ML IJ SOLN
30.0000 mg | Freq: Once | INTRAMUSCULAR | Status: AC
Start: 2015-04-17 — End: 2015-04-17
  Administered 2015-04-17: 30 mg via INTRAVENOUS
  Filled 2015-04-17: qty 1

## 2015-04-17 MED ORDER — PROCHLORPERAZINE EDISYLATE 5 MG/ML IJ SOLN
10.0000 mg | Freq: Once | INTRAMUSCULAR | Status: DC
  Filled 2015-04-17: qty 2

## 2015-04-17 MED ORDER — SODIUM CHLORIDE 0.9 % IV BOLUS (SEPSIS)
1000.0000 mL | Freq: Once | INTRAVENOUS | Status: AC
  Administered 2015-04-17: 1000 mL via INTRAVENOUS

## 2015-04-17 MED ORDER — BUTALBITAL-APAP-CAFFEINE 50-325-40 MG PO TABS
1.0000 | ORAL_TABLET | Freq: Once | ORAL | Status: AC
  Administered 2015-04-17: 1 via ORAL
  Filled 2015-04-17: qty 1

## 2015-04-17 MED ORDER — DIPHENHYDRAMINE HCL 50 MG/ML IJ SOLN
25.0000 mg | Freq: Once | INTRAMUSCULAR | Status: DC
  Filled 2015-04-17: qty 1

## 2015-04-17 NOTE — Progress Notes (Signed)
CSW was notified by nurse that patient has issues with transportation.   CSW met with patient at bedside. Patient confirms that he has transportation issues. He states that he does not have any funds or family/friend to pick him up upon discharge.  CSW provided patient with taxi voucher. CSW called [employer] who will pick patient up upon discharge. CSW informed Assunta Gambles that patient is visually impaired and will need assistance walking to his apartment door. [facility] confirms that they will be able to assist him.  Willette Brace [phone] ED CSW 04/17/2015 5:19 PM

## 2015-04-17 NOTE — ED Notes (Signed)
Per EMS, Pt, from [facility], c/o intermittent migraine x weeks increasing this morning.  Pain score 8/10 increasing w/ standing.  Pt has not taken anything for pain.  Reports he has been taking all medications as prescribed.  Hx of brain surgery, spinal surgery, seizures, and hydrocephalus.  Pt is legally blind.

## 2015-04-17 NOTE — ED Notes (Signed)
Patient was alert, oriented and stable upon discharge. RN went over AVS and patient had no further questions. Pt was given a taxi voucher by social work.

## 2015-04-17 NOTE — ED Provider Notes (Signed)
CSN: [id number]     Arrival date & time 04/17/15  1300 History   First MD Initiated Contact with Patient 04/17/15 1348     Chief Complaint  Patient presents with  . Migraine     (Consider location/radiation/quality/duration/timing/severity/associated sxs/prior Treatment) Patient is a 29 y.o. male presenting with migraines. The history is provided by the patient.  Migraine This is a recurrent problem. The current episode started yesterday. The problem occurs constantly. The problem has been gradually worsening. Associated symptoms include headaches. Pertinent negatives include no chest pain, no abdominal pain and no shortness of breath. The symptoms are aggravated by standing. Nothing relieves the symptoms. He has tried nothing for the symptoms. The treatment provided no relief.   29 yo M with cc of headache.  Started a couple days ago.  Denies injury.  Has hx of similar though has never had left sided numbness with these before.  Hx of hydrocephalus with VP shunt, no recent difficulty with these.  Denies fevers, chills, headache slow in onset.  Denies neck pain, has chronic back pain, hes concerned that the back pain is causing all his symptoms.  Denies weakness, change in coordination. Upon standing patient with worsening headache as well as some vertigo.   Past Medical History  Diagnosis Date  . Asthma   . Seizure ([place])   . Migraine   . Vision loss   . Hydrocephalus   . Seizures [facility])    Past Surgical History  Procedure Laterality Date  . Brain surgery  age 16    tumor  . Spinal fusion  2011   Family History  Problem Relation Age of Onset  . Hypertension Mother   . Seizures Mother   . Cataracts Mother   . Stroke Father   . Seizures Father   . Scoliosis Sister   . Vision loss Sister   . Vision loss Brother   . Diabetes Brother   . Leukemia Brother    Social History  Substance Use Topics  . Smoking status: Never Smoker   . Smokeless tobacco: Never Used  . Alcohol Use:  No    Review of Systems  Constitutional: Negative for fever and chills.  HENT: Negative for congestion and facial swelling.   Eyes: Negative for discharge and visual disturbance.  Respiratory: Negative for shortness of breath.   Cardiovascular: Negative for chest pain and palpitations.  Gastrointestinal: Negative for vomiting, abdominal pain and diarrhea.  Musculoskeletal: Negative for myalgias and arthralgias.  Skin: Negative for color change and rash.  Neurological: Positive for numbness (left side of body) and headaches. Negative for tremors and syncope.  Psychiatric/Behavioral: Negative for confusion and dysphoric mood.      Allergies  Shellfish allergy; Amoxicillin; and Vancomycin  Home Medications   Prior to Admission medications   Medication Sig Start Date End Date Taking? Authorizing Provider  ibuprofen (ADVIL,MOTRIN) 200 MG tablet Take 800 mg by mouth every 6 (six) hours as needed for moderate pain.   Yes Historical Provider, MD  Lacosamide 100 MG TABS Take 1 tablet (100 mg total) by mouth 2 (two) times daily. 11/19/14  Yes Penni Bombard, MD  levETIRAcetam (KEPPRA) 750 MG tablet Take 2 tablets (1,500 mg total) by mouth 2 (two) times daily. 11/19/14  Yes Penni Bombard, MD  PROVENTIL HFA 108 (90 BASE) MCG/ACT inhaler Inhale 2 puffs into the lungs every 6 (six) hours as needed for shortness of breath.  11/13/14  Yes Historical Provider, MD  ibuprofen (ADVIL,MOTRIN) 600 MG tablet  Take 1 tablet (600 mg total) by mouth every 8 (eight) hours as needed. Patient not taking: Reported on 04/17/2015 09/05/14   Jola Schmidt, MD   BP 124/73 mmHg  Pulse 64  Temp(Src) 97.8 F (36.6 C) (Oral)  SpO2 99% Physical Exam  Constitutional: He is oriented to person, place, and time. He appears well-developed and well-nourished.  HENT:  Head: Normocephalic and atraumatic.  Eyes: EOM are normal. Pupils are equal, round, and reactive to light.  Neck: Normal range of motion. Neck supple.  No JVD present.  Cardiovascular: Normal rate and regular rhythm.  Exam reveals no gallop and no friction rub.   No murmur heard. Pulmonary/Chest: No respiratory distress. He has no wheezes.  Abdominal: He exhibits no distension. There is no tenderness. There is no rebound and no guarding.  Musculoskeletal: Normal range of motion. He exhibits tenderness (TTP about the left trapezius, no noted midline spinal TTP.).  Neurological: He is alert and oriented to person, place, and time. GCS eye subscore is 4. GCS verbal subscore is 5. GCS motor subscore is 6. He displays no Babinski's sign on the right side.  Reflex Scores:      Tricep reflexes are 2+ on the right side and 2+ on the left side.      Bicep reflexes are 2+ on the right side and 2+ on the left side.      Brachioradialis reflexes are 2+ on the right side and 2+ on the left side.      Patellar reflexes are 2+ on the right side and 2+ on the left side.      Achilles reflexes are 2+ on the right side and 2+ on the left side. Difficult to assess coordination as patient legally blind.  Eyes also discordant. Mild decreased sensation to left side of body to light touch  Skin: No rash noted. No pallor.  Psychiatric: He has a normal mood and affect. His behavior is normal.  Nursing note and vitals reviewed.   ED Course  Procedures (including critical care time) Labs Review Labs Reviewed  BASIC METABOLIC PANEL - Abnormal; Notable for the following:    Creatinine, Ser 1.40 (*)    All other components within normal limits  CBC WITH DIFFERENTIAL/PLATELET    Imaging Review Ct Head Wo Contrast  04/17/2015  CLINICAL DATA:  Surgery at age 57 for a brain tumor. Has a shunt. Headaches for 2-3 months. EXAM: CT HEAD WITHOUT CONTRAST TECHNIQUE: Contiguous axial images were obtained from the base of the skull through the vertex without intravenous contrast. COMPARISON:  08/10/2014. FINDINGS: No evidence of an acute infarct, acute hemorrhage, mass  lesion, mass effect or hydrocephalus. Right frontal ventriculoperitoneal shunt catheter tip terminates in the midline of the lateral ventricles, anterior to the thalami. Calcification is seen along the left tentorium/ posterior falx, as before. Visualized portions of the paranasal sinuses and mastoid air cells are clear. IMPRESSION: 1. No acute intracranial abnormality. 2. Right frontal VP shunt, stable in position.  No hydronephrosis. Electronically Signed   By: Lorin Picket M.D.   On: 04/17/2015 14:20   I have personally reviewed and evaluated these images and lab results as part of my medical decision-making.   EKG Interpretation   Date/Time:  Wednesday April 17 2015 14:18:00 EDT Ventricular Rate:  61 PR Interval:  127 QRS Duration: 116 QT Interval:  400 QTC Calculation: 403 R Axis:   67 Text Interpretation:  Sinus rhythm Nonspecific intraventricular conduction  delay Baseline wander in lead(s) V2  No significant change since last  tracing Confirmed by Keandre Linden MD, DANIEL [phone]) on 04/17/2015 3:11:38 PM      MDM   Final diagnoses:  Acute nonintractable headache, unspecified headache type    Patient is a 29 y.o. male who presents with headace.  This started yesterday.  Offered headache cocktail and refusing, feels its more related to his back pain.  Given toradol, fluid bolus with complete resolution of symptoms.  Patient without continued left sided subjective neuro findings, vertigo resolved.  Possible complex migraine.   Mild bump in renal function, will have follow up with pcp for recheck in one week.  Encourage fluids.  5:57 PM:  I have discussed the diagnosis/risks/treatment options with the patient and believe the pt to be eligible for discharge home to follow-up with PCP. We also discussed returning to the ED immediately if new or worsening sx occur. We discussed the sx which are most concerning (e.g., sudden worsening pain, fever) that necessitate immediate return.  Medications administered to the patient during their visit and any new prescriptions provided to the patient are listed below.  Medications given during this visit Medications  sodium chloride 0.9 % bolus 1,000 mL (0 mLs Intravenous Stopped 04/17/15 1539)  ketorolac (TORADOL) 30 MG/ML injection 30 mg (30 mg Intravenous Given 04/17/15 1423)  butalbital-acetaminophen-caffeine (FIORICET, ESGIC) 50-325-40 MG per tablet 1 tablet (1 tablet Oral Given 04/17/15 1422)    Discharge Medication List as of 04/17/2015  3:21 PM      The patient appears reasonably screen and/or stabilized for discharge and I doubt any other medical condition or other [facility] requiring further screening, evaluation, or treatment in the ED at this time prior to discharge.       Deno Etienne, DO 04/17/15 1757

## 2015-04-17 NOTE — Discharge Instructions (Signed)

## 2015-04-19 ENCOUNTER — Telehealth: Payer: Self-pay | Admitting: Diagnostic Neuroimaging

## 2015-04-19 NOTE — Telephone Encounter (Signed)
Patient called to advise that pharmacy told him Lacosamide 100 MG TABS needs prior authorization. Patient is out of medication.

## 2015-04-19 NOTE — Telephone Encounter (Signed)
I contacted ins.  Spoke with [employer].  Provided clinical info, and was able to get an approval for coverage on Vimpat effective until 04/13/2016 Ref # [id number].  I called the pharmacy.  They reprocessed the Rx and verified it did go through ins.  They will fill the Rx as ordered and contact patient when it is ready for pick up.

## 2015-04-30 ENCOUNTER — Ambulatory Visit: Payer: Self-pay | Admitting: Diagnostic Neuroimaging

## 2015-05-01 ENCOUNTER — Encounter: Payer: Self-pay | Admitting: Diagnostic Neuroimaging

## 2015-05-14 ENCOUNTER — Telehealth: Payer: Self-pay | Admitting: *Deleted

## 2015-05-14 ENCOUNTER — Ambulatory Visit: Payer: Self-pay | Admitting: Diagnostic Neuroimaging

## 2015-05-14 NOTE — Telephone Encounter (Signed)
no showed f/u appt 

## 2015-05-15 ENCOUNTER — Encounter: Payer: Self-pay | Admitting: Diagnostic Neuroimaging

## 2015-05-21 ENCOUNTER — Telehealth: Payer: Self-pay | Admitting: Diagnostic Neuroimaging

## 2015-05-21 ENCOUNTER — Other Ambulatory Visit: Payer: Self-pay | Admitting: Neurology

## 2015-05-21 NOTE — Telephone Encounter (Signed)
Pt called inquiring if he could still be seen in practice. Sts he has daughter with cerebral palsy and has to take care of her. I explained the waiting list and when he doesn't call to r/s an appt or doesn't show for an appt that affects all the other pt's who could have been seen for earlier appts. He is concerned about not having medication. I relayed a 30 day supply could be called to pharmacy until he could find another neurologist. Please call and advise if he can be seen at [facility]

## 2015-05-21 NOTE — Telephone Encounter (Signed)
I spoke to patient. He says he has missed several appointments because of his daughter with cerebral palsy. He received our letter dismissing patient from our practice. He says he misses appointments because he doesn't have anyone to watch his daughter who has CP.  He is asking that his absences be forgiven and he is going to try and come up with a better solution when he has an appointment. I checked his refills, looks like he was given 90 day supplies of both medications in May of this year with 4 refills each. He should have enough medication then for over a year. I advised him that I cannot make thi determination, I just wanted to make sure he had enough medication which he agrees he does, he just refilled them. I will forward message to Dr. Leta Baptist. Thanks.

## 2015-05-27 NOTE — Telephone Encounter (Signed)
Left VM requesting call back. Left this caller's name, number.

## 2015-05-27 NOTE — Telephone Encounter (Signed)
pls make sure that patient is being referred to another neurology practice. i will refill meds until he can transition. -VRP

## 2015-05-27 NOTE — Telephone Encounter (Signed)
Per Dr Leta Baptist, spoke with patient and informed him he will need to contact his PCP, Dr Criss Rosales and get referral to another neurologist's office.  At his request reviewed his medication status re: refills. He is aware he should not need refills in near future, but Dr Leta Baptist will provide medications as needed until he is seen at new neurologist's office. He verbalized understanding of above conversation.

## 2015-05-27 NOTE — Telephone Encounter (Signed)
Patient called back. He can be reached [phone].

## 2015-05-28 ENCOUNTER — Ambulatory Visit: Payer: Medicaid Other | Admitting: Diagnostic Neuroimaging

## 2015-05-30 ENCOUNTER — Telehealth: Payer: Self-pay | Admitting: Diagnostic Neuroimaging

## 2015-05-30 ENCOUNTER — Other Ambulatory Visit: Payer: Self-pay | Admitting: Diagnostic Neuroimaging

## 2015-05-30 MED ORDER — LACOSAMIDE 100 MG PO TABS: 100.0000 mg | ORAL_TABLET | Freq: Two times a day (BID) | ORAL | Status: DC

## 2015-05-30 NOTE — Telephone Encounter (Signed)
Patient called to request refills of levETIRAcetam (KEPPRA) 750 MG tablet and Lacosamide 100 MG TABS. Patient understands he has been dismissed from the practice but was advised, Rx's will be filled for 30 days.

## 2015-05-30 NOTE — Telephone Encounter (Signed)
Spoke with patient who states he only needs refill for Lacosamide tabs, 100 mg, taking twice a day. Verified his pharmacy. He states he has appointment with PCP on 06/10/15, must see prior to PCP making referral to other neurology office. Informed him would send refill request to Dr Leta Baptist, Norva Pavlov. He verbalized understanding, appreciation.

## 2015-05-30 NOTE — Telephone Encounter (Signed)
Vimpat Rx entered, forwarded to provider for review/approval.

## 2015-06-10 ENCOUNTER — Telehealth: Payer: Self-pay | Admitting: Diagnostic Neuroimaging

## 2015-06-10 NOTE — Telephone Encounter (Signed)
Patient is calling and asking that the decision to dismiss him from the practice be appealed.  Please call him if the decision to take him back is made.  Thanks!

## 2015-06-19 NOTE — Telephone Encounter (Signed)
Per Dr Leta Baptist, left VM apologizing and informing patient that unfortunately we need to follow this practice's policy. Advised he contact Dr Criss Rosales, PCP for referral to another neurologist. Informed him Dr Leta Baptist will refill seizures medications for the next 30 days to allow him time to establish care with another neurologist.  Left office number.

## 2015-06-19 NOTE — Telephone Encounter (Signed)
Sorry need to follow practice policy. Have PCP refer to another neurologist. Will continue refills until he establishes within 30 days. -VRP

## 2015-06-24 ENCOUNTER — Other Ambulatory Visit: Payer: Self-pay | Admitting: Diagnostic Neuroimaging

## 2015-06-26 ENCOUNTER — Other Ambulatory Visit: Payer: Self-pay

## 2015-06-26 MED ORDER — LACOSAMIDE 100 MG PO TABS: 100.0000 mg | ORAL_TABLET | Freq: Two times a day (BID) | ORAL | Status: DC

## 2015-06-26 NOTE — Telephone Encounter (Signed)
Patient requesting an additional refill while trying to transition to new Neurologist.  Thank you.

## 2015-07-24 ENCOUNTER — Emergency Department (HOSPITAL_COMMUNITY)
Admission: EM | Admit: 2015-07-24 | Discharge: 2015-07-24 | Disposition: A | Discharge status: Home / Self Care | Payer: Medicaid Other | Attending: Emergency Medicine | Admitting: Emergency Medicine

## 2015-07-24 ENCOUNTER — Encounter (HOSPITAL_COMMUNITY): Payer: Self-pay | Admitting: *Deleted

## 2015-07-24 DIAGNOSIS — R51 Headache: Secondary | ICD-10-CM | POA: Insufficient documentation

## 2015-07-24 DIAGNOSIS — M5431 Sciatica, right side: Secondary | ICD-10-CM | POA: Insufficient documentation

## 2015-07-24 DIAGNOSIS — Z8669 Personal history of other diseases of the nervous system and sense organs: Secondary | ICD-10-CM | POA: Diagnosis not present

## 2015-07-24 DIAGNOSIS — M549 Dorsalgia, unspecified: Secondary | ICD-10-CM | POA: Diagnosis present

## 2015-07-24 DIAGNOSIS — J45909 Unspecified asthma, uncomplicated: Secondary | ICD-10-CM | POA: Diagnosis not present

## 2015-07-24 DIAGNOSIS — Z8679 Personal history of other diseases of the circulatory system: Secondary | ICD-10-CM | POA: Diagnosis not present

## 2015-07-24 DIAGNOSIS — Z79899 Other long term (current) drug therapy: Secondary | ICD-10-CM | POA: Diagnosis not present

## 2015-07-24 DIAGNOSIS — Z880 Allergy status to penicillin: Secondary | ICD-10-CM | POA: Diagnosis not present

## 2015-07-24 MED ORDER — ONDANSETRON HCL 4 MG/2ML IJ SOLN
4.0000 mg | Freq: Once | INTRAMUSCULAR | Status: AC
  Administered 2015-07-24: 4 mg via INTRAVENOUS
  Filled 2015-07-24 (×2): qty 2

## 2015-07-24 MED ORDER — KETOROLAC TROMETHAMINE 30 MG/ML IJ SOLN
15.0000 mg | Freq: Once | INTRAMUSCULAR | Status: AC
  Administered 2015-07-24: 15 mg via INTRAVENOUS
  Filled 2015-07-24: qty 1

## 2015-07-24 MED ORDER — HYDROMORPHONE HCL 1 MG/ML IJ SOLN
1.0000 mg | Freq: Once | INTRAMUSCULAR | Status: AC
  Administered 2015-07-24: 1 mg via INTRAVENOUS
  Filled 2015-07-24: qty 1

## 2015-07-24 MED ORDER — OXYCODONE-ACETAMINOPHEN 5-325 MG PO TABS: 2.0000 | ORAL_TABLET | ORAL | Status: DC | PRN

## 2015-07-24 NOTE — ED Notes (Signed)
Pt reports R low back pain that radiates to his R leg with a h/a x 2 days.  Pt reports he's had sciatica in the past.  Pt has hx of hydrocephalus and has a VP shunt.  Pt is A&O x 4.  No neuro sxs noted at this time.

## 2015-07-24 NOTE — Discharge Instructions (Signed)
°  Sciatica °Sciatica is pain, weakness, numbness, or tingling along your sciatic nerve. The nerve starts in the lower back and runs down the back of each leg. Nerve damage or certain conditions pinch or put pressure on the sciatic nerve. This causes the pain, weakness, and other discomforts of sciatica. °HOME CARE  °· Only take medicine as told by your doctor. °· Apply ice to the affected area for 20 minutes. Do this 3-4 times a day for the first 48-72 hours. Then try heat in the same way. °· Exercise, stretch, or do your usual activities if these do not make your pain worse. °· Go to physical therapy as told by your doctor. °· Keep all doctor visits as told. °· Do not wear high heels or shoes that are not supportive. °· Get a firm mattress if your mattress is too soft to lessen pain and discomfort. °GET HELP RIGHT AWAY IF:  °· You cannot control when you poop (bowel movement) or pee (urinate). °· You have more weakness in your lower back, lower belly (pelvis), butt (buttocks), or legs. °· You have redness or puffiness (swelling) of your back. °· You have a burning feeling when you pee. °· You have pain that gets worse when you lie down. °· You have pain that wakes you from your sleep. °· Your pain is worse than past pain. °· Your pain lasts longer than 4 weeks. °· You are suddenly losing weight without reason. °MAKE SURE YOU:  °· Understand these instructions. °· Will watch this condition. °· Will get help right away if you are not doing well or get worse. °  °This information is not intended to replace advice given to you by your health care provider. Make sure you discuss any questions you have with your health care provider. °  °Document Released: 03/24/2008 Document Revised: 03/06/2015 Document Reviewed: 10/25/2011 °Elsevier Interactive Patient Education ©2016 [employer] Inc[employer] ° ° °

## 2015-07-24 NOTE — ED Notes (Signed)
Bed: WA05 Expected date:  Expected time:  Means of arrival:  Comments: EMS 

## 2015-07-24 NOTE — ED Notes (Signed)
Pt reports pain is much better.  Pt able to get OOB without assist and without pain.

## 2015-07-24 NOTE — ED Notes (Signed)
Per EMS, pt was on the way to work this am when he started to have low back pain and h/a.  States he lowered himself to the ground in his work parking lot for comfort.  Pt is legally blind.

## 2015-07-24 NOTE — ED Provider Notes (Signed)
CSN: [id number]     Arrival date & time 07/24/15  0734 History   First MD Initiated Contact with Patient 07/24/15 [phone]     Chief Complaint  Patient presents with  . Back Pain  . Headache      HPI Pt reports R low back pain that radiates to his R leg with a h/a x 2 days. Pt reports he's had sciatica in the past. Pt has hx of hydrocephalus and has a VP shunt.  His headache is chronic but the low back pain is what brought him to the emergency department.  He states he has to stand up at work and doesn't think he could do it.  He has an appointment to see the neurosurgeons for back injections next week.  He had injections a few months ago seemed to help him a lot.  Patient denies any loss of bowel or bladder function. Pt is A&O x 4. No neuro sxs noted at this time. Past Medical History  Diagnosis Date  . Asthma   . Seizure ([place])   . Migraine   . Vision loss   . Hydrocephalus   . Seizures [facility])    Past Surgical History  Procedure Laterality Date  . Brain surgery  age 30    tumor  . Spinal fusion  2011   Family History  Problem Relation Age of Onset  . Hypertension Mother   . Seizures Mother   . Cataracts Mother   . Stroke Father   . Seizures Father   . Scoliosis Sister   . Vision loss Sister   . Vision loss Brother   . Diabetes Brother   . Leukemia Brother    Social History  Substance Use Topics  . Smoking status: Never Smoker   . Smokeless tobacco: Never Used  . Alcohol Use: No    Review of Systems  All other systems reviewed and are negative.     Allergies  Shellfish allergy; Amoxicillin; and Vancomycin  Home Medications   Prior to Admission medications   Medication Sig Start Date End Date Taking? Authorizing Provider  diphenhydramine-acetaminophen (TYLENOL PM) 25-500 MG TABS tablet Take 2 tablets by mouth 3 (three) times daily as needed (pain).   Yes Historical Provider, MD  Lacosamide 100 MG TABS Take 1 tablet (100 mg total) by mouth 2 (two) times daily.  06/26/15  Yes Penni Bombard, MD  levETIRAcetam (KEPPRA) 750 MG tablet Take 2 tablets (1,500 mg total) by mouth 2 (two) times daily. 11/19/14  Yes Penni Bombard, MD  PROVENTIL HFA 108 (90 BASE) MCG/ACT inhaler Inhale 2 puffs into the lungs every 6 (six) hours as needed for shortness of breath.  11/13/14  Yes Historical Provider, MD  ibuprofen (ADVIL,MOTRIN) 600 MG tablet Take 1 tablet (600 mg total) by mouth every 8 (eight) hours as needed. Patient not taking: Reported on 04/17/2015 09/05/14   Jola Schmidt, MD  oxyCODONE-acetaminophen (PERCOCET/ROXICET) 5-325 MG tablet Take 2 tablets by mouth every 4 (four) hours as needed for severe pain. 07/24/15   Leonard Schwartz, MD   BP 120/82 mmHg  Pulse 51  Temp(Src) 97.7 F (36.5 C) (Oral)  Resp 14  SpO2 100% Physical Exam  Constitutional: He is oriented to person, place, and time. He appears well-developed and well-nourished. No distress.  HENT:  Head: Normocephalic and atraumatic.  Eyes: Pupils are equal, round, and reactive to light.  Neck: Normal range of motion.  Cardiovascular: Normal rate and intact distal pulses.   Pulmonary/Chest: No  respiratory distress.  Abdominal: Normal appearance. He exhibits no distension.  Musculoskeletal: Normal range of motion.  Neurological: He is alert and oriented to person, place, and time. No cranial nerve deficit.  Patient with positive right leg raises on the right  Skin: Skin is warm and dry. No rash noted.  Psychiatric: He has a normal mood and affect. His behavior is normal.  Nursing note and vitals reviewed.   ED Course  Procedures (including critical care time) Medications  ketorolac (TORADOL) 30 MG/ML injection 15 mg (15 mg Intravenous Given 07/24/15 0821)  HYDROmorphone (DILAUDID) injection 1 mg (1 mg Intravenous Given 07/24/15 0822)  ondansetron (ZOFRAN) injection 4 mg (4 mg Intravenous Given 07/24/15 0820)    Labs Review Labs Reviewed - No data to display  After treatment in the ED the  patient feels back to baseline and wants to go home.    MDM   Final diagnoses:  Sciatica of right side        Leonard Schwartz, MD 07/25/15 1305

## 2015-08-10 ENCOUNTER — Other Ambulatory Visit: Payer: Self-pay | Admitting: Diagnostic Neuroimaging

## 2015-08-13 ENCOUNTER — Telehealth: Payer: Self-pay | Admitting: *Deleted

## 2015-08-13 MED ORDER — LEVETIRACETAM 750 MG PO TABS: 1500.0000 mg | ORAL_TABLET | Freq: Two times a day (BID) | ORAL | Status: DC

## 2015-08-13 MED ORDER — LACOSAMIDE 100 MG PO TABS: 100.0000 mg | ORAL_TABLET | Freq: Two times a day (BID) | ORAL | Status: DC

## 2015-08-13 NOTE — Telephone Encounter (Signed)
LVM informing patient that Dr Leta Baptist refilled Vimpat x 1 month, then he is to FU with Dr Criss Rosales for further medication refills on both seizure medicines. Left this caller's name, number for any questions.

## 2015-08-13 NOTE — Telephone Encounter (Signed)
1 more month filled. Then he will need to get from PCP. -VRP

## 2015-08-13 NOTE — Telephone Encounter (Signed)
Spoke with patient to determine if he has established with another neurologist. He stated he is currently in [place] with family; he will return home this week. He stated he spoke with Dr Criss Rosales, re: referral to another neurologist. He stated she told him she'd have to refer to [facility] or [facility]. Patient stated neither he nor his fiancee drive, so those are not options for him. He stated he cannot drive due to vision issues. He uses [employer] within [place]. Informed him that [facility] is in [place].  He stated he would mention to Dr Criss Rosales. He stated he ran out of Vimpat approximately one month ago, has 2 more months of Keppra. He denies any seizures or seizure-like symptoms. Informed him would let Dr Leta Baptist know of his circumstances and call him back if Dr Leta Baptist refills Vimpat. He verbalized understanding, appreciation.

## 2015-09-05 ENCOUNTER — Encounter (HOSPITAL_COMMUNITY): Payer: Self-pay | Admitting: *Deleted

## 2015-09-05 ENCOUNTER — Emergency Department (HOSPITAL_COMMUNITY): Payer: Medicaid Other

## 2015-09-05 ENCOUNTER — Emergency Department (HOSPITAL_COMMUNITY)
Admission: EM | Admit: 2015-09-05 | Discharge: 2015-09-06 | Disposition: A | Discharge status: Home / Self Care | Payer: Medicaid Other | Attending: Emergency Medicine | Admitting: Emergency Medicine

## 2015-09-05 DIAGNOSIS — G40909 Epilepsy, unspecified, not intractable, without status epilepticus: Secondary | ICD-10-CM | POA: Diagnosis not present

## 2015-09-05 DIAGNOSIS — Z880 Allergy status to penicillin: Secondary | ICD-10-CM | POA: Insufficient documentation

## 2015-09-05 DIAGNOSIS — G8929 Other chronic pain: Secondary | ICD-10-CM | POA: Diagnosis not present

## 2015-09-05 DIAGNOSIS — J45909 Unspecified asthma, uncomplicated: Secondary | ICD-10-CM | POA: Diagnosis not present

## 2015-09-05 DIAGNOSIS — M545 Low back pain: Secondary | ICD-10-CM | POA: Insufficient documentation

## 2015-09-05 DIAGNOSIS — Z79899 Other long term (current) drug therapy: Secondary | ICD-10-CM | POA: Insufficient documentation

## 2015-09-05 DIAGNOSIS — Z8679 Personal history of other diseases of the circulatory system: Secondary | ICD-10-CM | POA: Insufficient documentation

## 2015-09-05 DIAGNOSIS — R61 Generalized hyperhidrosis: Secondary | ICD-10-CM | POA: Diagnosis present

## 2015-09-05 HISTORY — DX: Other chronic pain: G89.29

## 2015-09-05 HISTORY — DX: Dorsalgia, unspecified: M54.9

## 2015-09-05 LAB — CBC WITH DIFFERENTIAL/PLATELET
BASOS ABS: 0 10*3/uL (ref 0.0–0.1)
BASOS PCT: 0 %
EOS PCT: 1 %
Eosinophils Absolute: 0.1 10*3/uL (ref 0.0–0.7)
HCT: 40.8 % (ref 39.0–52.0)
Hemoglobin: 14.4 g/dL (ref 13.0–17.0)
Lymphocytes Relative: 26 %
Lymphs Abs: 3.5 10*3/uL (ref 0.7–4.0)
MCH: 26.8 pg (ref 26.0–34.0)
MCHC: 35.3 g/dL (ref 30.0–36.0)
MCV: 75.8 fL — ABNORMAL LOW (ref 78.0–100.0)
MONO ABS: 0.8 10*3/uL (ref 0.1–1.0)
Monocytes Relative: 6 %
Neutro Abs: 9 10*3/uL — ABNORMAL HIGH (ref 1.7–7.7)
Neutrophils Relative %: 67 %
PLATELETS: 272 10*3/uL (ref 150–400)
RBC: 5.38 MIL/uL (ref 4.22–5.81)
RDW: 12.7 % (ref 11.5–15.5)
WBC: 13.4 10*3/uL — ABNORMAL HIGH (ref 4.0–10.5)

## 2015-09-05 LAB — I-STAT CHEM 8, ED
BUN: 17 mg/dL (ref 6–20)
CALCIUM ION: 1.11 mmol/L — AB (ref 1.12–1.23)
CHLORIDE: 104 mmol/L (ref 101–111)
Creatinine, Ser: 1 mg/dL (ref 0.61–1.24)
Glucose, Bld: 127 mg/dL — ABNORMAL HIGH (ref 65–99)
HEMATOCRIT: 47 % (ref 39.0–52.0)
Hemoglobin: 16 g/dL (ref 13.0–17.0)
POTASSIUM: 3.8 mmol/L (ref 3.5–5.1)
SODIUM: 141 mmol/L (ref 135–145)
TCO2: 24 mmol/L (ref 0–100)

## 2015-09-05 LAB — CBG MONITORING, ED: Glucose-Capillary: 118 mg/dL — ABNORMAL HIGH (ref 65–99)

## 2015-09-05 MED ORDER — ONDANSETRON 8 MG PO TBDP
8.0000 mg | ORAL_TABLET | Freq: Once | ORAL | Status: AC
  Administered 2015-09-05: 8 mg via ORAL
  Filled 2015-09-05: qty 1

## 2015-09-05 MED ORDER — SODIUM CHLORIDE 0.9 % IV BOLUS (SEPSIS)
1000.0000 mL | Freq: Once | INTRAVENOUS | Status: AC
  Administered 2015-09-05: 1000 mL via INTRAVENOUS

## 2015-09-05 MED ORDER — OXYCODONE-ACETAMINOPHEN 5-325 MG PO TABS
1.0000 | ORAL_TABLET | Freq: Once | ORAL | Status: AC
  Administered 2015-09-05: 1 via ORAL
  Filled 2015-09-05: qty 1

## 2015-09-05 NOTE — ED Provider Notes (Signed)
CSN: [id number]     Arrival date & time 09/05/15  2129 History   First MD Initiated Contact with Patient 09/05/15 2230     Chief Complaint  Patient presents with  . Back Pain  . Migraine      Patient is a 30 y.o. male presenting with back pain and migraines. The history is provided by the EMS personnel. No language interpreter was used.  Back Pain Migraine   Yunis Elg is a 30 y.o. male who presents to the Emergency Department complaining of migraine, back pain.  Level V caveat due to unresponsiveness.  Patient presented to the emergency department via EMS for complaints of back pain and migraine. Patient was found unresponsive in the bathroom in the emergency department and diaphoretic. Patient is minimally responsive in the emergency department and unable to provide any history.  Past Medical History  Diagnosis Date  . Asthma   . Seizure ([place])   . Migraine   . Vision loss   . Hydrocephalus   . Seizures ([place])   . Chronic back pain    Past Surgical History  Procedure Laterality Date  . Brain surgery  age 26    tumor  . Spinal fusion  2011   Family History  Problem Relation Age of Onset  . Hypertension Mother   . Seizures Mother   . Cataracts Mother   . Stroke Father   . Seizures Father   . Scoliosis Sister   . Vision loss Sister   . Vision loss Brother   . Diabetes Brother   . Leukemia Brother    Social History  Substance Use Topics  . Smoking status: Never Smoker   . Smokeless tobacco: Never Used  . Alcohol Use: No    Review of Systems  Musculoskeletal: Positive for back pain.  All other systems reviewed and are negative.     Allergies  Shellfish allergy; Amoxicillin; and Vancomycin  Home Medications   Prior to Admission medications   Medication Sig Start Date End Date Taking? Authorizing Provider  diphenhydramine-acetaminophen (TYLENOL PM) 25-500 MG TABS tablet Take 2 tablets by mouth 3 (three) times daily as needed (pain).    Historical  Provider, MD  ibuprofen (ADVIL,MOTRIN) 600 MG tablet Take 1 tablet (600 mg total) by mouth every 8 (eight) hours as needed. Patient not taking: Reported on 04/17/2015 09/05/14   Jola Schmidt, MD  Lacosamide 100 MG TABS Take 1 tablet (100 mg total) by mouth 2 (two) times daily. 08/13/15   Penni Bombard, MD  levETIRAcetam (KEPPRA) 750 MG tablet Take 2 tablets (1,500 mg total) by mouth 2 (two) times daily. 08/13/15   Penni Bombard, MD  oxyCODONE-acetaminophen (PERCOCET/ROXICET) 5-325 MG tablet Take 2 tablets by mouth every 4 (four) hours as needed for severe pain. 07/24/15   Leonard Schwartz, MD  PROVENTIL HFA 108 (90 BASE) MCG/ACT inhaler Inhale 2 puffs into the lungs every 6 (six) hours as needed for shortness of breath.  11/13/14   Historical Provider, MD   BP 121/84 mmHg  Pulse 56  Temp(Src) 98.7 F (37.1 C) (Oral)  Resp 12  SpO2 99% Physical Exam  Constitutional: He appears well-developed and well-nourished.  HENT:  Head: Normocephalic and atraumatic.  Eyes: Pupils are equal, round, and reactive to light.  Cardiovascular: Regular rhythm.   No murmur heard. bradycardic  Pulmonary/Chest: Effort normal and breath sounds normal. No respiratory distress.  Abdominal: Soft. There is no tenderness. There is no rebound and no guarding.  Musculoskeletal: He  exhibits no edema or tenderness.  Neurological:  Eyes are open spontaneously and patient appears to be staring into space, does blink to threat and swallow spontaneously. Does not follow any commands, nonverbal.  Skin: Skin is warm. He is diaphoretic.  Psychiatric:  Unable to assess  Nursing note and vitals reviewed.   ED Course  Procedures (including critical care time) Labs Review Labs Reviewed  CBC WITH DIFFERENTIAL/PLATELET - Abnormal; Notable for the following:    WBC 13.4 (*)    MCV 75.8 (*)    Neutro Abs 9.0 (*)    All other components within normal limits  CBG MONITORING, ED - Abnormal; Notable for the following:     Glucose-Capillary 118 (*)    All other components within normal limits  I-STAT CHEM 8, ED - Abnormal; Notable for the following:    Glucose, Bld 127 (*)    Calcium, Ion 1.11 (*)    All other components within normal limits  URINALYSIS, ROUTINE W REFLEX MICROSCOPIC (NOT AT [facility])    Imaging Review Ct Head Wo Contrast  09/06/2015  CLINICAL DATA:  Patient was found unresponsive and diaphoretic. Spontaneous breathing but nonverbal. Seizure precautions. EXAM: CT HEAD WITHOUT CONTRAST TECHNIQUE: Contiguous axial images were obtained from the base of the skull through the vertex without intravenous contrast. COMPARISON:  04/17/2015 FINDINGS: Postoperative changes with previous posterior occipital craniotomies. Plate and screw fixations of the sub occipital region. Extra-axial calcification in the posterior fossa is likely dystrophic. Right trans frontal ventricular shunt tubing with tip in the right lateral ventricle. Asymmetric decompression of the right lateral ventricle in comparison to the left. No significant ventricular dilatation. No abnormal extra-axial fluid collections. Gray-white matter junctions are distinct. Basal cisterns are not effaced. No evidence of acute intracranial hemorrhage. Visualized paranasal sinuses and mastoid air cells are not opacified. No acute depressed skull fractures. No significant change since previous study. IMPRESSION: No acute intracranial abnormalities. Chronic postoperative changes and dystrophic calcification. Right trans frontal ventricular shunt. No hydrocephaly. No acute intracranial hemorrhage or mass effect. Electronically Signed   By: Lucienne Capers M.D.   On: 09/06/2015 00:13   I have personally reviewed and evaluated these images and lab results as part of my medical decision-making.   EKG Interpretation   Date/Time:  Thursday September 05 2015 22:39:42 EST Ventricular Rate:  63 PR Interval:  176 QRS Duration: 120 QT Interval:  433 QTC Calculation:  443 R Axis:   74 Text Interpretation:  Sinus rhythm Nonspecific intraventricular conduction  delay Confirmed by Hazle Coca [phone]) on 09/05/2015 10:44:48 PM      MDM   Final diagnoses:  None    Patient with history of seizure disorder, VP shunt, chronic back pain here with back pain and headache. He had a seizure while awaiting evaluation in the emergency department.on repeat assessment 2 hours after his seizure event patient is alert and oriented. He is able to provide an improved history. He states he has chronic back pain and occasional headaches. Today he developed low back pain that is more intense than his typical back pain radiating down his right leg. He has associated headache and multiple episodes of emesis. He has a seizure disorder and takes Keppra and Vimpat daily, he does not miss doses and he is not sure when his last seizure was. He currently reports that his headache is mild and improved while he is laying down. His back pain is moderate. He does not want any strong medications for this and is requesting  Toradol. He is followed by neurosurgery for his chronic back pain and receives occasional epidural injections. His last injection was 3 weeks ago. He denies any fevers, abdominal pain, diarrhea. Plan to treat his symptoms with Toradol, urinalysis pending. On repeat evaluation patient has 5 out of 5 strength in all 4 extremities. Providing a dose of Keppra and Vimpat to the emergency department. Patient care transferred pending repeat assessment.   Quintella Reichert, MD 09/06/15 [phone]

## 2015-09-05 NOTE — ED Notes (Signed)
Pt arrives to the ER via EMS for complaints of back pain and migraine; pt with chronic back and chronic migraine and states that he began having back pain this am and that the migraine started shortly after; pt rates the pain to both head and back at a 10 out of 10; pt is legally blind and denies visual changes; pt reports emesis x 3 prior to EMS arrival

## 2015-09-05 NOTE — ED Notes (Signed)
Called to triage bathroom by triage RN, found patient unresponsive and diaphoretic slumped down in wheelchair.  Patient with spontaneous breathing, non-verbal, taken to Res B. Pulse ox WNL's RA, RR 12-14.  Patient is diaphoretic, MP SB with HR 44, Dr. Ralene Bathe at bedside to evaluate patient.  PIV started and blood work drawn.  Stable BP, patient placed on seizure precautions.

## 2015-09-05 NOTE — ED Notes (Signed)
Patient will now open eyes when name called.  Seizure precautions maintained

## 2015-09-05 NOTE — ED Notes (Signed)
EKG given to EDP,Rees,MD., for review. 

## 2015-09-05 NOTE — ED Notes (Signed)
Patient transported to CT 

## 2015-09-06 LAB — URINE MICROSCOPIC-ADD ON: SQUAMOUS EPITHELIAL / LPF: NONE SEEN

## 2015-09-06 LAB — URINALYSIS, ROUTINE W REFLEX MICROSCOPIC
Bilirubin Urine: NEGATIVE
Glucose, UA: NEGATIVE mg/dL
Hgb urine dipstick: NEGATIVE
LEUKOCYTES UA: NEGATIVE
NITRITE: NEGATIVE
PH: 7 (ref 5.0–8.0)
Protein, ur: 30 mg/dL — AB
SPECIFIC GRAVITY, URINE: 1.023 (ref 1.005–1.030)

## 2015-09-06 MED ORDER — SODIUM CHLORIDE 0.9 % IV SOLN
1500.0000 mg | Freq: Once | INTRAVENOUS | Status: AC
  Administered 2015-09-06: 1500 mg via INTRAVENOUS
  Filled 2015-09-06: qty 15

## 2015-09-06 MED ORDER — KETOROLAC TROMETHAMINE 15 MG/ML IJ SOLN
15.0000 mg | Freq: Once | INTRAMUSCULAR | Status: AC
  Administered 2015-09-06: 15 mg via INTRAVENOUS
  Filled 2015-09-06: qty 1

## 2015-09-06 MED ORDER — ACETAMINOPHEN 500 MG PO TABS
1000.0000 mg | ORAL_TABLET | Freq: Once | ORAL | Status: AC
  Administered 2015-09-06: 1000 mg via ORAL
  Filled 2015-09-06: qty 2

## 2015-09-06 NOTE — ED Notes (Signed)
Returned from CT.

## 2015-09-06 NOTE — Discharge Instructions (Signed)
Epilepsy °People with epilepsy have times when they shake and jerk uncontrollably (seizures). This happens when there is a sudden change in brain function. Epilepsy may have many possible causes. Anything that disturbs the normal pattern of brain cell activity can lead to seizures. °HOME CARE  °· Follow your doctor's instructions about driving and safety during normal activities. °· Get enough sleep. °· Only take medicine as told by your doctor. °· Avoid things that you know can cause you to have seizures (triggers). °· Write down when your seizures happen and what you remember about each seizure. Write down anything you think may have caused the seizure to happen. °· Tell the people you live and work with that you have seizures. Make sure they know how to help you. They should: °¨ Cushion your head and body. °¨ Turn you on your side. °¨ Not restrain you. °¨ Not place anything inside your mouth. °¨ Call for local emergency medical help if there is any question about what has happened. °· Keep all follow-up visits with your doctor. This is very important. °GET HELP IF: °· You get an infection or start to feel sick. You may have more seizures when you are sick. °· You are having seizures more often. °· Your seizure pattern is changing. °GET HELP RIGHT AWAY IF:  °· A seizure does not stop after a few seconds or minutes. °· A seizure causes you to have trouble breathing. °· A seizure gives you a very bad headache. °· A seizure makes you unable to speak or use a part of your body. °  °This information is not intended to replace advice given to you by your health care provider. Make sure you discuss any questions you have with your health care provider. °  °Document Released: 04/12/2009 Document Revised: 04/05/2013 Document Reviewed: 01/25/2013 °Elsevier Interactive Patient Education ©2016 [employer] Inc[employer] ° °

## 2015-09-22 ENCOUNTER — Other Ambulatory Visit: Payer: Self-pay | Admitting: Diagnostic Neuroimaging

## 2015-09-23 NOTE — Telephone Encounter (Signed)
Spoke with Don Reed re: seizure med refill. He stated he had called [facility] because his PCP,  Dr Criss Rosales is refilling. He stated he will call pharmacy back and advise.

## 2015-11-26 ENCOUNTER — Telehealth: Payer: Self-pay | Admitting: Diagnostic Neuroimaging

## 2015-11-26 NOTE — Telephone Encounter (Signed)
Sherry/Dr Criss Rosales [phone] called wanting to know if pt could be seen. She said he missed the the prior appt because he didn't have transportation. She knows he has been dismissed. She has got him set up with transportation and he should come to his appt. Operator relayed the message would be sent to RN. Pt will be out of seizure medication in 2 days.

## 2015-11-26 NOTE — Telephone Encounter (Signed)
See note from 06/19/15. -VRP

## 2015-11-26 NOTE — Telephone Encounter (Signed)
LVM for patient informing him that this RN had informed him in Dec 2016 that per this practice's policy, he needs to contact his PCP for referral to another neurologist.  Called Dr Fransico Setters office and spoke to [place]. Informed her of same, and that the patient's seizure medication was refilled on at least two occassions after his dismissal so that he would have time to establish with another neurologist. She verbalized understanding.

## 2015-12-02 ENCOUNTER — Emergency Department (HOSPITAL_COMMUNITY): Payer: Medicaid Other

## 2015-12-02 ENCOUNTER — Emergency Department (HOSPITAL_COMMUNITY)
Admission: EM | Admit: 2015-12-02 | Discharge: 2015-12-02 | Disposition: A | Discharge status: Home / Self Care | Payer: Medicaid Other | Attending: Emergency Medicine | Admitting: Emergency Medicine

## 2015-12-02 ENCOUNTER — Encounter (HOSPITAL_COMMUNITY): Payer: Self-pay | Admitting: Emergency Medicine

## 2015-12-02 DIAGNOSIS — Z79899 Other long term (current) drug therapy: Secondary | ICD-10-CM | POA: Insufficient documentation

## 2015-12-02 DIAGNOSIS — G40909 Epilepsy, unspecified, not intractable, without status epilepticus: Secondary | ICD-10-CM | POA: Diagnosis not present

## 2015-12-02 DIAGNOSIS — R569 Unspecified convulsions: Secondary | ICD-10-CM

## 2015-12-02 DIAGNOSIS — J45909 Unspecified asthma, uncomplicated: Secondary | ICD-10-CM | POA: Insufficient documentation

## 2015-12-02 LAB — CBC
HCT: 38.9 % — ABNORMAL LOW (ref 39.0–52.0)
Hemoglobin: 13.3 g/dL (ref 13.0–17.0)
MCH: 26.3 pg (ref 26.0–34.0)
MCHC: 34.2 g/dL (ref 30.0–36.0)
MCV: 76.9 fL — ABNORMAL LOW (ref 78.0–100.0)
PLATELETS: 246 10*3/uL (ref 150–400)
RBC: 5.06 MIL/uL (ref 4.22–5.81)
RDW: 13.7 % (ref 11.5–15.5)
WBC: 5.4 10*3/uL (ref 4.0–10.5)

## 2015-12-02 LAB — BASIC METABOLIC PANEL
Anion gap: 6 (ref 5–15)
BUN: 13 mg/dL (ref 6–20)
CALCIUM: 9.4 mg/dL (ref 8.9–10.3)
CO2: 28 mmol/L (ref 22–32)
CREATININE: 1.27 mg/dL — AB (ref 0.61–1.24)
Chloride: 106 mmol/L (ref 101–111)
GFR calc Af Amer: >60 mL/min (ref >60)
Glucose, Bld: 62 mg/dL — ABNORMAL LOW (ref 65–99)
POTASSIUM: 3.5 mmol/L (ref 3.5–5.1)
SODIUM: 140 mmol/L (ref 135–145)

## 2015-12-02 MED ORDER — LORAZEPAM 2 MG/ML IJ SOLN
INTRAMUSCULAR | Status: AC
  Administered 2015-12-02: 1 mg
  Filled 2015-12-02: qty 1

## 2015-12-02 MED ORDER — LEVETIRACETAM 500 MG/5ML IV SOLN
1000.0000 mg | Freq: Once | INTRAVENOUS | Status: AC
  Administered 2015-12-02: 1000 mg via INTRAVENOUS
  Filled 2015-12-02: qty 10

## 2015-12-02 MED ORDER — SODIUM CHLORIDE 0.9 % IV SOLN
Freq: Once | INTRAVENOUS | Status: AC
  Administered 2015-12-02: 14:00:00 via INTRAVENOUS

## 2015-12-02 MED ORDER — LEVETIRACETAM 500 MG PO TABS
1000.0000 mg | ORAL_TABLET | Freq: Once | ORAL | Status: DC
  Filled 2015-12-02: qty 2

## 2015-12-02 MED ORDER — LACOSAMIDE 100 MG PO TABS: 100.0000 mg | ORAL_TABLET | Freq: Two times a day (BID) | ORAL | Status: DC
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
## 2015-12-02 NOTE — ED Notes (Signed)
Xray tech asked this nurse to come check on pt in CT.  Pt was on CT table-staring to his L-unresponsive to verbal stimuli.  Breathing noted.  Don Reed EDPA notified and came to assess pt.  Pt was attempting to get out of the CT table and was post-ictal.  Alert but unresponsive.

## 2015-12-02 NOTE — Progress Notes (Signed)
ED CM consulted by ED RN for possible assistance to get this medicaid pt in to see a medicaid neurologist ED Cm sent an in basket message to pcp Veita bland with this request for referral to a neurologist at [phone]  CM discussed with ED RN that medicaid pt would need a referral from his pcp to get this completed Voiced understanding

## 2015-12-02 NOTE — ED Notes (Addendum)
Per EMS patient had seizure this morning with urinary incontinence witnessed by family.  Hx of seizures. Pt currently out of venpat.  Patient taking other medications as prescribed. Per EMS patient lethargic and slow to respond.

## 2015-12-02 NOTE — ED Notes (Signed)
Patient transported to CT. To main CT.  Okayed by  Dorothea Ogle EDPA

## 2015-12-02 NOTE — ED Notes (Signed)
Pt is more alert at this time.  A&Ox 4.

## 2015-12-02 NOTE — Progress Notes (Addendum)
CM spoke with pt about also making the attempt to call his assigned DSS worker to get assist with finding a new neurologist group and medicaid transportation Pt confirms he moved to [place] from [place] and was not aware he could get medicaid transportation or request to speak to a DSS case worker about his issues Pt states he knows the DSS number of [id number] and states he will call to ask for assist from his DSS case worker ED RN updated that CM also spoke with pt about this option   Cm printed out instructions for the pt and a list of medicaid transportation services Given to ED RN to give to pt at d/c

## 2015-12-02 NOTE — ED Notes (Signed)
Patient transported to CT 

## 2015-12-02 NOTE — ED Provider Notes (Signed)
CSN: [id number]     Arrival date & time 12/02/15  1146 History   First MD Initiated Contact with Patient 12/02/15 1157     Chief Complaint  Patient presents with  . Seizures   (Consider location/radiation/quality/duration/timing/severity/associated sxs/prior Treatment) HPI 30 y.o. male with a hx of Seizures, Hydrocephalus, presents to the Emergency Department today via EMS due to seizure this AM. Pt had witnessed seizure as seen by family. Lasted <30 seconds. Noted urinary incontinence. Postictal. Pt notes that he has been unable to see his prior Neurologist since December. Has been getting his refills for Vimpat via PCP and attempting to schedule new neurologist. He was dismissed from previous neurologist practice due to inability to make appointments. Pt states that he ran out of his Vimpat medication x 1 week ago. Notes using Keppra as well as 750mg  BID. Notes no headaches currently. No CP/SOB/ABD pain. No N/V/D. No fevers. No other symptoms noted.    Past Medical History  Diagnosis Date  . Asthma   . Seizure ([place])   . Migraine   . Vision loss   . Hydrocephalus   . Seizures ([place])   . Chronic back pain    Past Surgical History  Procedure Laterality Date  . Brain surgery  age 89    tumor  . Spinal fusion  2011   Family History  Problem Relation Age of Onset  . Hypertension Mother   . Seizures Mother   . Cataracts Mother   . Stroke Father   . Seizures Father   . Scoliosis Sister   . Vision loss Sister   . Vision loss Brother   . Diabetes Brother   . Leukemia Brother    Social History  Substance Use Topics  . Smoking status: Never Smoker   . Smokeless tobacco: Never Used  . Alcohol Use: No    Review of Systems ROS reviewed and all are negative for acute change except as noted in the HPI.  Allergies  Shellfish allergy; Amoxicillin; and Vancomycin  Home Medications   Prior to Admission medications   Medication Sig Start Date End Date Taking? Authorizing Provider   diphenhydramine-acetaminophen (TYLENOL PM) 25-500 MG TABS tablet Take 2 tablets by mouth 3 (three) times daily as needed (pain).    Historical Provider, MD  ibuprofen (ADVIL,MOTRIN) 600 MG tablet Take 1 tablet (600 mg total) by mouth every 8 (eight) hours as needed. Patient not taking: Reported on 04/17/2015 09/05/14   Jola Schmidt, MD  Lacosamide 100 MG TABS Take 1 tablet (100 mg total) by mouth 2 (two) times daily. 08/13/15   Penni Bombard, MD  levETIRAcetam (KEPPRA) 750 MG tablet Take 2 tablets (1,500 mg total) by mouth 2 (two) times daily. 08/13/15   Penni Bombard, MD  oxyCODONE-acetaminophen (PERCOCET/ROXICET) 5-325 MG tablet Take 2 tablets by mouth every 4 (four) hours as needed for severe pain. 07/24/15   Leonard Schwartz, MD  PROVENTIL HFA 108 (90 BASE) MCG/ACT inhaler Inhale 2 puffs into the lungs every 6 (six) hours as needed for shortness of breath.  11/13/14   Historical Provider, MD   BP 112/69 mmHg  Pulse 65  Temp(Src) 98.3 F (36.8 C) (Oral)  Resp 18  SpO2 99%   Physical Exam  Constitutional: He is oriented to person, place, and time. He appears well-developed and well-nourished.  HENT:  Head: Normocephalic and atraumatic.  Eyes: EOM are normal. Pupils are equal, round, and reactive to light.  Neck: Normal range of motion. Neck supple.  Cardiovascular:  Normal rate, regular rhythm, normal heart sounds and intact distal pulses.   No murmur heard. Pulmonary/Chest: Effort normal and breath sounds normal. No respiratory distress. He has no wheezes. He has no rales. He exhibits no tenderness.  Abdominal: Soft. There is no tenderness.  Musculoskeletal: Normal range of motion.  Neurological: He is alert and oriented to person, place, and time. He has normal strength. No cranial nerve deficit or sensory deficit.  Negative pronator drift. Able to raise both legs without difficulty.  Cranial Nerves:  II: Pupils equal, round, reactive to light III,IV, VI: ptosis not present,  extra-ocular motions intact bilaterally  V,VII: smile symmetric, facial light touch sensation equal VIII: hearing grossly normal bilaterally  IX,X: midline uvula rise  XI: bilateral shoulder shrug equal and strong XII: midline tongue extension  Skin: Skin is warm and dry.  Psychiatric: He has a normal mood and affect. His behavior is normal. Thought content normal.  Nursing note and vitals reviewed.  ED Course  Procedures (including critical care time) Labs Review Labs Reviewed  CBC - Abnormal; Notable for the following:    HCT 38.9 (*)    MCV 76.9 (*)    All other components within normal limits  BASIC METABOLIC PANEL - Abnormal; Notable for the following:    Glucose, Bld 62 (*)    Creatinine, Ser 1.27 (*)    All other components within normal limits   Imaging Review Ct Head Wo Contrast  12/02/2015  CLINICAL DATA:  Multiple seizures. EXAM: CT HEAD WITHOUT CONTRAST TECHNIQUE: Contiguous axial images were obtained from the base of the skull through the vertex without intravenous contrast. COMPARISON:  September 05, 2015 FINDINGS: The paranasal sinuses, mastoid air cells, and middle ears are normal. Previous occipital craniotomy. No change in the bones. Extracranial soft tissues are within normal limits. No subdural, epidural, or subarachnoid hemorrhage. A VP shunt is identified, entering through a right-sided approach with asymmetric decompression of the lateral ventricles, right greater than left. The VP shunt and ventricles are stable. The cerebellum brainstem are unchanged. Basal cisterns remain patent. No acute cortical ischemia or infarct. No other interval changes. Stable dystrophic calcifications in the posterior fossa. IMPRESSION: 1. No interval change or acute abnormality. Electronically Signed   By: Dorise Bullion III M.D   On: 12/02/2015 15:04   I have personally reviewed and evaluated these images and lab results as part of my medical decision-making.   EKG Interpretation None       MDM  I have reviewed the relevant previous healthcare records. I obtained HPI from historian. Patient discussed with supervising physician  ED Course:  Assessment: Pt is 73y M wit hhx seizures who presents with seizure due to medical noncompliance due to running out of Vimpat medication. Patient with no evidence of focal neuro deficits on physical exam and is at mental baseline. CT Head unremarkable. Labs unremarkable. Patient is advised to followup with neurologist in regards to today's event. Given referral. Loaded on Keppra. Refilled Rx Vimpat x 1 month. Discussed patient with attending physician who agrees with assessment and plan. Patient verbalizes understanding.  Answered all questions.  Patient is hemodynamically stable and in no acute distress prior to discharge. Will have pt follow up with Neurology. At time of discharge, Patient is in no acute distress. Vital Signs are stable. Patient is able to ambulate. Patient able to tolerate PO.     1:48 PM- Witnessed seizure in CT. Pt had not received medications prior. 1mg  Ativan given. Keppra changed from  PO to IV. Postictal and combative. Seizures less than <34min. On reexamination, pt returned to baseline and answered questions appropriately.    Disposition/Plan:  DC Home Additional Verbal discharge instructions given and discussed with patient.  Pt Instructed to f/u with Neurologist in the next week for evaluation and treatment of symptoms. Return precautions given Pt acknowledges and agrees with plan  Supervising Physician Deno Etienne, DO   Final diagnoses:  Seizure [facility])       Shary Decamp, PA-C 12/02/15 [place], DO 12/02/15 1513

## 2015-12-02 NOTE — ED Notes (Signed)
Pt reports having a witnessed seizure at home.  States he has not had his seizure med for a week, d/t neurologist dismissing him from the office d/t several missed appts.  He reports he takes care of his 31 yo daughter who has CP.  He reports when he called, neurologist office advised him to f/u with his PCP re obtaining his seizure med.  He states Dr. Criss Rosales gave him "some" of his seizure meds but not enough to last him.  Still waiting to get a new neurologist at present.  Pt is A&Ox 4.  Reports h/a and back pain.  Has chronic back pain.  Assisted pt in gown, d/t urine incontinence.  Pt did not want an IV started and his pants removed initially but was able to convince him

## 2015-12-02 NOTE — ED Notes (Addendum)
Waiting for SW for possible cab voucher.  Pt does not have anyone for transport at this time.  Spoke to [place].  Pt ambulated to the BR with one assist.
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
## 2016-02-14 ENCOUNTER — Encounter: Payer: Self-pay | Admitting: Neurology

## 2016-02-14 ENCOUNTER — Other Ambulatory Visit (INDEPENDENT_AMBULATORY_CARE_PROVIDER_SITE_OTHER): Payer: Medicaid Other

## 2016-02-14 ENCOUNTER — Ambulatory Visit (INDEPENDENT_AMBULATORY_CARE_PROVIDER_SITE_OTHER): Payer: Medicaid Other | Admitting: Neurology

## 2016-02-14 ENCOUNTER — Ambulatory Visit: Payer: Self-pay

## 2016-02-14 VITALS — BP 120/70 | HR 72 | Temp 98.2°F | Ht 72.5 in | Wt 220.3 lb

## 2016-02-14 DIAGNOSIS — G43009 Migraine without aura, not intractable, without status migrainosus: Secondary | ICD-10-CM | POA: Insufficient documentation

## 2016-02-14 DIAGNOSIS — G40209 Localization-related (focal) (partial) symptomatic epilepsy and epileptic syndromes with complex partial seizures, not intractable, without status epilepticus: Secondary | ICD-10-CM

## 2016-02-14 DIAGNOSIS — Z982 Presence of cerebrospinal fluid drainage device: Secondary | ICD-10-CM

## 2016-02-14 LAB — CREATININE, SERUM: Creatinine, Ser: 1.37 mg/dL (ref 0.40–1.50)

## 2016-02-14 MED ORDER — LEVETIRACETAM 750 MG PO TABS: 1500.0000 mg | ORAL_TABLET | Freq: Two times a day (BID) | ORAL | 11 refills | Status: DC

## 2016-02-14 MED ORDER — LACOSAMIDE 100 MG PO TABS: 100.0000 mg | ORAL_TABLET | Freq: Two times a day (BID) | ORAL | 5 refills | Status: DC

## 2016-02-14 NOTE — Patient Instructions (Signed)
1. Schedule open MRI brain with and without contrast 2. Schedule 1-hour sleep-deprived EEG 3. Continue Keppra 1500mg  twice a day and Vimpat 100mg  twice a day 4. We will try to obtain records from [place]. Follow-up in 3 months  Seizure Precautions: 1. If medication has been prescribed for you to prevent seizures, take it exactly as directed.  Do not stop taking the medicine without talking to your doctor first, even if you have not had a seizure in a long time.   2. Avoid activities in which a seizure would cause danger to yourself or to others.  Don't operate dangerous machinery, swim alone, or climb in high or dangerous places, such as on ladders, roofs, or girders.  Do not drive unless your doctor says you may.  3. If you have any warning that you may have a seizure, lay down in a safe place where you can't hurt yourself.    4.  No driving for 6 months from last seizure, as per [facility].   Please refer to the following link on the [place] website for more information: http://[email or web address]/[email or web address]/driving/drivingu.cfm   5.  Maintain good sleep hygiene. Avoid alcohol.  6.  Contact your doctor if you have any problems that may be related to the medicine you are taking.  7.  Call 911 and bring the patient back to the ED if:        A.  The seizure lasts longer than 5 minutes.       B.  The patient doesn't awaken shortly after the seizure  C.  The patient has new problems such as difficulty seeing, speaking or moving  D.  The patient was injured during the seizure  E.  The patient has a temperature over 102 F (39C)  F.  The patient vomited and now is having trouble breathing

## 2016-02-14 NOTE — Progress Notes (Signed)
NEUROLOGY CONSULTATION NOTE  Don Reed MRN: [id number] DOB: 09/29/1985  Referring provider: Dr. Lucianne Lei Primary care provider: Dr. Lucianne Lei  Reason for consult:  Establish care for seizures  Dear Dr Criss Rosales:  Thank you for your kind referral of Don Reed for consultation of the above symptoms. Although his history is well known to you, please allow me to reiterate it for the purpose of our medical record. Records and images were personally reviewed where available.  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 He states that at age 23, he was having episodes of slurred speech, falls, recurrent syncope, and headaches, and was found to have the brain tumor. He recalls when he was younger, his head kept turning to one side and he would try to stop it. After the surgery, he did fairly well except for frequent headaches, then had his first seizure at age 30 or 85. He describes seizures as starting with back pain, then he gets a metallic taste in his mouth, his head starts hurting more, then he gets disoriented and has been told he would become unresponsive then proceed to generalized shaking, at times with urinary incontinence. He lives with his fiancee and brother, and denies any isolated staring spells. He has been told he has seizures in his sleep. After a seizure, he denies any focal weakness, he can hear people around him but has difficulty getting his words out. He also has some nausea. He has fallen twice this year with his seizures, he was in the ER on 09/05/15 and 12/02/15 for seizure, during both ER visits, he would have a witnessed seizure in the ER. With the most recent seizure on 12/02/15, he had ran out of Vimpat. He has been taking Vimpat 100mg  BID since 2015, on Keppra 1500mg  BID since 2011. He recalls taking  Depakote and Topamax in the past, with cognitive side effects. He continues to report memory issues, "I can't think too good." He feels his left side is different, with numbness in his left arm and leg. He had cervical fusion surgery and has been told this is the cause of his left-sided symptoms. His last shunt revision was in 1999. 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   He has a history of chronic headaches, with throbbing headaches occurring around twice a week over the frontal and temporal regions, with associated nausea, vomiting, photo/phonophobia. They would last for 1 to 1-1/2 days, he does not take any medication because he feels they have provoked seizures in the past. He reports taking an unrecalled headache preventative medication in [place] which was helpful. He denies any dizziness, bowel/bladder dysfunction, no myoclonic jerks. He has occasional twitching in an arm or leg. He has chronic neck and back pain.    RF: mother, father had GTCs, brain surgery 5yo, falling, passing out, HAs, when younger head kept turning to one side and he had to stop it; occl twitching randomly; one time hit back of head and lost vision when younger in pool; kickball hit in face and blacked out 8 or 9; Go-ball hit in face and passed out few yrs ago scar on chin;   Epilepsy Risk Factors:  His father had generalized convulsions, his mother had seizures as well. He has a history of benign brain tumor s/p VP  shunt, last revision in 1999. He reports several head injuries with loss of consciousness, most recently when hit in the face by a ball a few years ago. Otherwise he had a normal birth and early development.  There is no history of febrile convulsions, CNS infections such as meningitis/encephalitis.  Prior AEDs: Depakote, Topamax No prior EEGs or MRI available for review. I personally reviewed head CT without contrast done which did not show any acute changes. There was previous occipital craniotomy  seen, VP shunt entering through a right-sided approach with asymmetric decompression of the lateral ventricles, right greater than left, stable dystrophic calcifications in the posterior fossa.  PAST MEDICAL HISTORY: Past Medical History:  Diagnosis Date  . Asthma   . Chronic back pain   . Hydrocephalus   . Migraine   . Seizure ([place])   . Seizures ([place])   . Vision loss     PAST SURGICAL HISTORY: Past Surgical History:  Procedure Laterality Date  . BRAIN SURGERY  age 30   tumor  . SPINAL FUSION  2011    MEDICATIONS: Current Outpatient Prescriptions on File Prior to Visit  Medication Sig Dispense Refill  . diphenhydramine-acetaminophen (TYLENOL PM) 25-500 MG TABS tablet Take 2 tablets by mouth 3 (three) times daily as needed (pain).    . Lacosamide 100 MG TABS Take 1 tablet (100 mg total) by mouth 2 (two) times daily. 60 tablet 0  . levETIRAcetam (KEPPRA) 750 MG tablet Take 2 tablets (1,500 mg total) by mouth 2 (two) times daily. 120 tablet 0  . Multiple Vitamins-Minerals (MULTIVITAMIN ADULT PO) Take 1 tablet by mouth daily.    Marland Kitchen PROVENTIL HFA 108 (90 BASE) MCG/ACT inhaler Inhale 2 puffs into the lungs every 6 (six) hours as needed for shortness of breath.   3   No current facility-administered medications on file prior to visit.     ALLERGIES: Allergies  Allergen Reactions  . Shellfish Allergy Anaphylaxis    Cannot breathe   . Amoxicillin Itching    Has patient had a PCN reaction causing immediate rash, facial/tongue/throat swelling, SOB or lightheadedness with hypotension: No Has patient had a PCN reaction causing severe rash involving mucus membranes or skin necrosis: No Has patient had a PCN reaction that required hospitalization No Has patient had a PCN reaction occurring within the last 10 years: Yes If all of the above answers are "NO", then may proceed with Cephalosporin use.   . Vancomycin Itching    FAMILY HISTORY: Family History  Problem Relation Age of Onset   . Hypertension Mother   . Seizures Mother   . Cataracts Mother   . Stroke Mother   . Stroke Father   . Seizures Father   . Scoliosis Sister   . Vision loss Sister   . Vision loss Brother   . Diabetes Brother   . Leukemia Brother     SOCIAL HISTORY: Social History   Social History  . Marital status: Legally Separated    Spouse name: N/A  . Number of children: 2  . Years of education: GED   Occupational History  .  Industries [employer]   Social History Main Topics  . Smoking status: Never Smoker  . Smokeless tobacco: Never Used  . Alcohol use No  . Drug use: No  . Sexual activity: Not on file   Other Topics Concern  . Not on file   Social History Narrative   Patient lives at home with family.   Caffeine Use: 1-3  time weekly    REVIEW OF SYSTEMS: Constitutional: No fevers, chills, or sweats, no generalized fatigue, change in appetite Eyes: No visual changes, double vision, eye pain Ear, nose and throat: No hearing loss, ear pain, nasal congestion, sore throat Cardiovascular: No chest pain, palpitations Respiratory:  No shortness of breath at rest or with exertion, wheezes GastrointestinaI: No nausea, vomiting, diarrhea, abdominal pain, fecal incontinence Genitourinary:  No dysuria, urinary retention or frequency Musculoskeletal:  No neck pain, back pain Integumentary: No rash, pruritus, skin lesions Neurological: as above Psychiatric: No depression, insomnia, anxiety Endocrine: No palpitations, fatigue, diaphoresis, mood swings, change in appetite, change in weight, increased thirst Hematologic/Lymphatic:  No anemia, purpura, petechiae. Allergic/Immunologic: no itchy/runny eyes, nasal congestion, recent allergic reactions, rashes  PHYSICAL EXAM: Vitals:   02/14/16 0955  BP: 120/70  Pulse: 72  Temp: 98.2 F (36.8 C)   General: No acute distress Head:  Normocephalic/atraumatic Eyes: Fundoscopic exam shows bilateral sharp discs, no vessel changes,  exudates, or hemorrhages Neck: supple, no paraspinal tenderness, full range of motion Back: No paraspinal tenderness Heart: regular rate and rhythm Lungs: Clear to auscultation bilaterally. Vascular: No carotid bruits. Skin/Extremities: No rash, no edema Neurological Exam: Mental status: alert and oriented to person, place, and time, no dysarthria or aphasia, Fund of knowledge is appropriate.  Recent and remote memory are intact. 2/3 delayed recall. Attention and concentration are normal.    Able to name objects and repeat phrases. Cranial nerves: CN I: not tested CN II: pupils equal, briskly reactive on left, sluggish on right, visual acuity: hand movements on right, visual fields intact, fundi unremarkable. CN III, IV, VI:  full range of motion, no ptosis, nystagmoid eye movements when returning to central field of vision CN V: facial sensation intact CN VII: upper and lower face symmetric CN VIII: hearing intact to finger rub CN IX, X: gag intact, uvula midline CN XI: sternocleidomastoid and trapezius muscles intact CN XII: tongue midline Bulk & Tone: normal, no fasciculations. Motor: 5/5 throughout with no pronator drift. Sensation: decreased pin on left UE and LE, otherwise intact to light touch, cold, vibration and joint position sense.  Romberg test negative Deep Tendon Reflexes: +2 throughout, no ankle clonus Plantar responses: downgoing bilaterally Cerebellar: no incoordination on finger to nose, heel to shin. 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 His last seizure was 12/02/15 when he ran out of Vimpat. Refills for Vimpat 100mg  BID and Keppra 1500mg  BID will be sent today. He reports  left-sided numbness, this may relate to cervical pathology, however with his history of brain tumor s/p shunt and seizures, an MRI brain with and without contrast will be ordered. A 1-hour sleep-deprived EEG will be ordered to further classify his seizures. We discussed frequent headaches suggestive of migraine without aura, he would benefit from starting a daily headache prophylactic medication, potentially we can use Zonisamide for both seizure and headache prophylaxis in the future, and will consider this on his next visit. He does not drive and is aware of  driving laws. He will follow-up in 3 months and knows to call for any changes.   Thank you for allowing me to participate in the care of this patient. Please do not hesitate  to call for any questions or concerns.   Ellouise Newer, M.D.  CC: Dr. Criss Rosales

## 2016-02-17 ENCOUNTER — Telehealth: Payer: Self-pay | Admitting: Neurology

## 2016-02-17 NOTE — Telephone Encounter (Signed)
Spoke with Otila Kluver, clarified MRI order with her.

## 2016-02-17 NOTE — Telephone Encounter (Signed)
Otila Kluver from [facility] called in regards to PT's MRI and would like a call back at  [phone]

## 2016-02-17 NOTE — Telephone Encounter (Signed)
Don Reed was returning your call regarding clarification on an order. Her direct  # is [id number]. Thank you

## 2016-02-19 ENCOUNTER — Encounter: Payer: Self-pay | Admitting: Neurology

## 2016-02-19 ENCOUNTER — Ambulatory Visit (INDEPENDENT_AMBULATORY_CARE_PROVIDER_SITE_OTHER): Payer: Medicaid Other | Admitting: Neurology

## 2016-02-19 DIAGNOSIS — G40209 Localization-related (focal) (partial) symptomatic epilepsy and epileptic syndromes with complex partial seizures, not intractable, without status epilepticus: Secondary | ICD-10-CM

## 2016-02-25 NOTE — Procedures (Signed)
ELECTROENCEPHALOGRAM REPORT  Date of Study: 02/19/2016  Patient's Name: Don Reed MRN: [id number] Date of Birth: [birth year]  Referring Provider: Dr. 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 EEG for classification.  Medications: Vimpat, Keppra  Technical Summary: A multichannel digital 1-hour sleep-deprived EEG recording measured by the international 10-20 system with electrodes applied with paste and impedances below 5000 ohms performed in our laboratory with EKG monitoring in an awake and asleep patient.  Hyperventilation and photic stimulation were performed.  The digital EEG was referentially recorded, reformatted, and digitally filtered in a variety of bipolar and referential montages for optimal display.    Description: The patient is awake and asleep during the recording.  During maximal wakefulness, there is a symmetric, medium voltage 11 Hz posterior dominant rhythm that attenuates with eye opening.  The record is symmetric.  During drowsiness and sleep, there is an increase in theta slowing of the background.  Vertex waves and symmetric sleep spindles were seen.  Hyperventilation and photic stimulation did not elicit any abnormalities.  There were no epileptiform discharges or electrographic seizures seen.    EKG lead showed sinus bradycardia at 42 bpm.  Impression: This 1-hour awake and asleep EEG is normal.    Clinical Correlation: A normal EEG does not exclude a clinical diagnosis of epilepsy.  If further clinical questions remain, prolonged EEG may be helpful.  Clinical correlation is advised.   Ellouise Newer, M.D.

## 2016-02-29 ENCOUNTER — Emergency Department (HOSPITAL_COMMUNITY)
Admission: EM | Admit: 2016-02-29 | Discharge: 2016-03-01 | Disposition: A | Discharge status: Home / Self Care | Payer: Medicaid Other | Attending: Emergency Medicine | Admitting: Emergency Medicine

## 2016-02-29 ENCOUNTER — Encounter (HOSPITAL_COMMUNITY): Payer: Self-pay | Admitting: Emergency Medicine

## 2016-02-29 ENCOUNTER — Emergency Department (HOSPITAL_COMMUNITY): Payer: Medicaid Other

## 2016-02-29 DIAGNOSIS — Z79899 Other long term (current) drug therapy: Secondary | ICD-10-CM | POA: Diagnosis not present

## 2016-02-29 DIAGNOSIS — G40909 Epilepsy, unspecified, not intractable, without status epilepticus: Secondary | ICD-10-CM | POA: Diagnosis not present

## 2016-02-29 DIAGNOSIS — J45909 Unspecified asthma, uncomplicated: Secondary | ICD-10-CM | POA: Diagnosis not present

## 2016-02-29 DIAGNOSIS — R569 Unspecified convulsions: Secondary | ICD-10-CM

## 2016-02-29 DIAGNOSIS — R001 Bradycardia, unspecified: Secondary | ICD-10-CM | POA: Diagnosis not present

## 2016-02-29 DIAGNOSIS — G40209 Localization-related (focal) (partial) symptomatic epilepsy and epileptic syndromes with complex partial seizures, not intractable, without status epilepticus: Secondary | ICD-10-CM

## 2016-02-29 LAB — COMPREHENSIVE METABOLIC PANEL
ALBUMIN: 4.9 g/dL (ref 3.5–5.0)
ALK PHOS: 41 U/L (ref 38–126)
ALT: 15 U/L — ABNORMAL LOW (ref 17–63)
AST: 20 U/L (ref 15–41)
Anion gap: 7 (ref 5–15)
BILIRUBIN TOTAL: 0.9 mg/dL (ref 0.3–1.2)
BUN: 13 mg/dL (ref 6–20)
CALCIUM: 9.7 mg/dL (ref 8.9–10.3)
CO2: 24 mmol/L (ref 22–32)
CREATININE: 1.3 mg/dL — AB (ref 0.61–1.24)
Chloride: 108 mmol/L (ref 101–111)
GFR calc Af Amer: >60 mL/min (ref >60)
GFR calc non Af Amer: >60 mL/min (ref >60)
GLUCOSE: 110 mg/dL — AB (ref 65–99)
Potassium: 3.6 mmol/L (ref 3.5–5.1)
Sodium: 139 mmol/L (ref 135–145)
TOTAL PROTEIN: 8.6 g/dL — AB (ref 6.5–8.1)

## 2016-02-29 LAB — CBC WITH DIFFERENTIAL/PLATELET
BASOS ABS: 0 10*3/uL (ref 0.0–0.1)
BASOS PCT: 0 %
EOS ABS: 0.1 10*3/uL (ref 0.0–0.7)
EOS PCT: 1 %
HCT: 40.4 % (ref 39.0–52.0)
Hemoglobin: 13.9 g/dL (ref 13.0–17.0)
Lymphocytes Relative: 20 %
Lymphs Abs: 1.5 10*3/uL (ref 0.7–4.0)
MCH: 26.6 pg (ref 26.0–34.0)
MCHC: 34.4 g/dL (ref 30.0–36.0)
MCV: 77.4 fL — AB (ref 78.0–100.0)
MONO ABS: 0.6 10*3/uL (ref 0.1–1.0)
Monocytes Relative: 8 %
Neutro Abs: 5.3 10*3/uL (ref 1.7–7.7)
Neutrophils Relative %: 71 %
PLATELETS: 239 10*3/uL (ref 150–400)
RBC: 5.22 MIL/uL (ref 4.22–5.81)
RDW: 12.6 % (ref 11.5–15.5)
WBC: 7.4 10*3/uL (ref 4.0–10.5)

## 2016-02-29 LAB — URINALYSIS, ROUTINE W REFLEX MICROSCOPIC
Bilirubin Urine: NEGATIVE
GLUCOSE, UA: NEGATIVE mg/dL
Hgb urine dipstick: NEGATIVE
KETONES UR: NEGATIVE mg/dL
LEUKOCYTES UA: NEGATIVE
NITRITE: NEGATIVE
PROTEIN: NEGATIVE mg/dL
Specific Gravity, Urine: 1.019 (ref 1.005–1.030)
pH: 8.5 — ABNORMAL HIGH (ref 5.0–8.0)

## 2016-02-29 MED ORDER — LACOSAMIDE 100 MG PO TABS: 200.0000 mg | ORAL_TABLET | Freq: Two times a day (BID) | ORAL | 5 refills | Status: DC

## 2016-02-29 MED ORDER — KETOROLAC TROMETHAMINE 30 MG/ML IJ SOLN
30.0000 mg | Freq: Once | INTRAMUSCULAR | Status: AC
  Administered 2016-02-29: 30 mg via INTRAVENOUS
  Filled 2016-02-29: qty 1

## 2016-02-29 MED ORDER — LEVETIRACETAM 500 MG PO TABS
1500.0000 mg | ORAL_TABLET | Freq: Once | ORAL | Status: AC
  Administered 2016-02-29: 1500 mg via ORAL
  Filled 2016-02-29: qty 3

## 2016-02-29 MED ORDER — CLONAZEPAM 0.5 MG PO TABS: 0.5000 mg | ORAL_TABLET | Freq: Two times a day (BID) | ORAL | 0 refills | Status: DC

## 2016-02-29 MED ORDER — CLONAZEPAM 0.5 MG PO TABS
0.5000 mg | ORAL_TABLET | Freq: Once | ORAL | Status: AC
  Administered 2016-02-29: 0.5 mg via ORAL
  Filled 2016-02-29: qty 1

## 2016-02-29 NOTE — ED Notes (Signed)
No respiratory or acute distress noted alert and oriented x 3 clear speech noted moves all extremities no seizure activity noted seizure precautions in effect no reaction to medication noted.

## 2016-02-29 NOTE — ED Provider Notes (Signed)
[facility] DEPT Provider Note   CSN: [id number] Arrival date & time: 02/29/16  2029     History   Chief Complaint Chief Complaint  Patient presents with  . Seizures    HPI Don Reed is a 30 y.o. male presenting with multiple seizures today. Patient has a history of epilepsy and his fiance has witnessed 3 seizures today. He thinks he had a fourth seizure after she went to the grocery store. He can tell because he has a small period of time he does not remember and then he felt diffusely weak like he typically does after seizures. Patient has not had a seizure since June of this year. He has not been having any fevers. No vomiting, diarrhea, chest pain, or shortness of breath. He has been having a daily headache for at least one month, describes as mild. History of a VP shunt. Chronic neck and back pain that is not different than typical. No stiffness in his neck. Has been taking his seizure medicines as prescribed, most recently today.  HPI  Past Medical History:  Diagnosis Date  . Asthma   . Chronic back pain   . Hydrocephalus   . Migraine   . Seizure ([place])   . Seizures ([place])   . Vision loss     Patient Active Problem List   Diagnosis Date Noted  . Localization-related (focal) (partial) symptomatic epilepsy and epileptic syndromes with complex partial seizures, not intractable, without status epilepticus ([place]) 02/14/2016  . Migraine without aura and without status migrainosus, not intractable 02/14/2016  . Generalized seizure disorder ([place]) 04/06/2014  . Brain tumor (benign) ([place]) 04/06/2014  . S/P ventricular shunt placement 04/06/2014  . S/P cervical spinal fusion 04/06/2014  . Neck pain 04/06/2014    Past Surgical History:  Procedure Laterality Date  . BRAIN SURGERY  age 51   tumor  . SPINAL FUSION  2011       Home Medications    Prior to Admission medications   Medication Sig Start Date End Date Taking? Authorizing Provider  clonazePAM (KLONOPIN)  0.5 MG tablet Take 1 tablet (0.5 mg total) by mouth 2 (two) times daily. 02/29/16   Sherwood Gambler, MD  diphenhydramine-acetaminophen (TYLENOL PM) 25-500 MG TABS tablet Take 2 tablets by mouth 3 (three) times daily as needed (pain).    Historical Provider, MD  Lacosamide 100 MG TABS Take 2 tablets (200 mg total) by mouth 2 (two) times daily. 02/29/16   Sherwood Gambler, MD  levETIRAcetam (KEPPRA) 750 MG tablet Take 2 tablets (1,500 mg total) by mouth 2 (two) times daily. 02/14/16   Cameron Sprang, MD  Multiple Vitamins-Minerals (MULTIVITAMIN ADULT PO) Take 1 tablet by mouth daily.    Historical Provider, MD  PROVENTIL HFA 108 (90 BASE) MCG/ACT inhaler Inhale 2 puffs into the lungs every 6 (six) hours as needed for shortness of breath.  11/13/14   Historical Provider, MD    Family History Family History  Problem Relation Age of Onset  . Hypertension Mother   . Seizures Mother   . Cataracts Mother   . Stroke Mother   . Stroke Father   . Seizures Father   . Scoliosis Sister   . Vision loss Sister   . Vision loss Brother   . Diabetes Brother   . Leukemia Brother     Social History Social History  Substance Use Topics  . Smoking status: Never Smoker  . Smokeless tobacco: Never Used  . Alcohol use No  Allergies   Shellfish allergy; Amoxicillin; and Vancomycin   Review of Systems Review of Systems  Constitutional: Negative for fever.  Gastrointestinal: Negative for abdominal pain, diarrhea, nausea and vomiting.  Genitourinary: Negative for dysuria.  Neurological: Positive for headaches. Negative for weakness and numbness.  All other systems reviewed and are negative.    Physical Exam Updated Vital Signs BP 128/83 (BP Location: Right Arm)   Pulse (!) 50   Temp 98 F (36.7 C) (Oral)   Resp 13   Ht 6\' 1"  (1.854 m)   Wt 220 lb (99.8 kg)   SpO2 100%   BMI 29.03 kg/m   Physical Exam  Constitutional: He is oriented to person, place, and time. He appears well-developed and  well-nourished.  HENT:  Head: Normocephalic and atraumatic.  Right Ear: External ear normal.  Left Ear: External ear normal.  Nose: Nose normal.  Mouth/Throat: Oropharynx is clear and moist.  No obvious tongue injury  Eyes: EOM are normal. Pupils are equal, round, and reactive to light. Right eye exhibits no discharge. Left eye exhibits no discharge.  Neck: Normal range of motion. Neck supple.  Cardiovascular: Regular rhythm and normal heart sounds.  Bradycardia present.   HR in 50s  Pulmonary/Chest: Effort normal and breath sounds normal.  Abdominal: Soft. He exhibits no distension. There is no tenderness.  Musculoskeletal: He exhibits no edema.  Neurological: He is alert and oriented to person, place, and time.  CN 3-12 grossly intact. 5/5 strength in all 4 extremities. Grossly normal sensation.  Skin: Skin is warm and dry.  Nursing note and vitals reviewed.    ED Treatments / Results  Labs (all labs ordered are listed, but only abnormal results are displayed) Labs Reviewed  COMPREHENSIVE METABOLIC PANEL - Abnormal; Notable for the following:       Result Value   Glucose, Bld 110 (*)    Creatinine, Ser 1.30 (*)    Total Protein 8.6 (*)    ALT 15 (*)    All other components within normal limits  CBC WITH DIFFERENTIAL/PLATELET - Abnormal; Notable for the following:    MCV 77.4 (*)    All other components within normal limits  URINALYSIS, ROUTINE W REFLEX MICROSCOPIC (NOT AT [facility]) - Abnormal; Notable for the following:    pH 8.5 (*)    All other components within normal limits    EKG  EKG Interpretation  Date/Time:  Saturday February 29 2016 20:42:07 EDT Ventricular Rate:  54 PR Interval:    QRS Duration: 115 QT Interval:  435 QTC Calculation: 413 R Axis:   87 Text Interpretation:  Sinus bradycardia Borderline short PR interval Nonspecific intraventricular conduction delay no significant change since Mar 2017 Confirmed by Regenia Skeeter MD, Winna Golla [phone]) on 02/29/2016  10:09:56 PM       Radiology Ct Head Wo Contrast  Result Date: 02/29/2016 CLINICAL DATA:  Evaluation for acute seizure. EXAM: CT HEAD WITHOUT CONTRAST TECHNIQUE: Contiguous axial images were obtained from the base of the skull through the vertex without intravenous contrast. COMPARISON:  Prior CT from 12/02/2015. FINDINGS: Brain: Right frontal approach VP shunt catheter remains in place with tip near the foramen of Monro, stable. Ventricular size is unchanged with similar asymmetric effacement of the right greater than left lateral ventricles. No hydrocephalus. No acute intracranial hemorrhage. No evidence for acute large vessel territory infarct. No mass lesion, midline shift or mass effect. No extra-axial fluid collection. 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 Prominent calcifications along the tentorium  present as well. Vascular: No hyperdense vessel identified. Skull: Postoperative changes from priors occipital suboccipital craniotomy. Calvarium intact. Reservoir for shunt catheter within the right frontal parietal scalp. No acute scalp soft tissue abnormality. Sinuses/Orbits: Globes and orbits within normal limits. Paranasal sinuses are clear. No mastoid effusion. Other: No other significant finding. IMPRESSION: 1. Stable appearance of the brain. No acute intracranial process identified. 2. Stable position of right frontal approach VP shunt catheter. Stable ventricular size without hydrocephalus. Electronically Signed   By: Jeannine Boga M.D.   On: 02/29/2016 22:15    Procedures Procedures (including critical care time)  Medications Ordered in ED Medications  ketorolac (TORADOL) 30 MG/ML injection 30 mg (30 mg Intravenous Given 02/29/16 2303)  levETIRAcetam (KEPPRA) tablet 1,500 mg (1,500 mg Oral Given 02/29/16 2323)  clonazePAM (KLONOPIN) tablet 0.5 mg (0.5 mg Oral Given 02/29/16 2336)     Initial Impression / Assessment and Plan / ED Course  I  have reviewed the triage vital signs and the nursing notes.  Pertinent labs & imaging results that were available during my care of the patient were reviewed by me and considered in my medical decision making (see chart for details).  Clinical Course  Comment By Time  Given increased seizures today and daily headaches for 1 month, will get CT and labs. Neuro exam benign.  Sherwood Gambler, MD 09/02 2133    Workup is unremarkable. There is no obvious cause for why he is having increased seizures. Has not had a seizure while in the ER for multiple hours. He overall feels well. Discussed with neurology on call, Dr. Leonel Ramsay. Recommend giving him his nightly oral Keppra as well as increasing his lacosamide to 200 mg twice per day. Recommend 0.5 mg Klonopin twice a day for 2-3 days. Patient is comfortable with this plan, understands to return if any symptoms worsen. He will be discharged home with changes in his prescriptions. Follow-up with his neurologist.  Final Clinical Impressions(s) / ED Diagnoses   Final diagnoses:  Seizures ([place])    New Prescriptions New Prescriptions   CLONAZEPAM (KLONOPIN) 0.5 MG TABLET    Take 1 tablet (0.5 mg total) by mouth 2 (two) times daily.     Sherwood Gambler, MD 03/01/16 [phone]

## 2016-02-29 NOTE — ED Notes (Signed)
Bed: WA03 Expected date:  Expected time:  Means of arrival:  Comments: EMS  Seizures 30 yo M

## 2016-02-29 NOTE — ED Triage Notes (Signed)
Patient here from home with seizure today. More than usual. Reports 4 episodes today. Complaint with meds. Legally blind.

## 2016-03-01 NOTE — ED Notes (Signed)
No reaction to medication noted alert and oriented x 3 no seizure activity noted wheelchair to car.

## 2016-03-03 ENCOUNTER — Encounter: Payer: Self-pay | Admitting: Neurology

## 2016-03-03 ENCOUNTER — Other Ambulatory Visit: Payer: Self-pay

## 2016-03-03 ENCOUNTER — Other Ambulatory Visit: Payer: Self-pay | Admitting: Neurosurgery

## 2016-03-03 ENCOUNTER — Telehealth: Payer: Self-pay | Admitting: Neurology

## 2016-03-03 ENCOUNTER — Ambulatory Visit (INDEPENDENT_AMBULATORY_CARE_PROVIDER_SITE_OTHER): Payer: Medicaid Other | Admitting: Neurology

## 2016-03-03 VITALS — BP 132/76 | HR 66 | Temp 98.1°F | Ht 73.0 in | Wt 220.4 lb

## 2016-03-03 DIAGNOSIS — G40209 Localization-related (focal) (partial) symptomatic epilepsy and epileptic syndromes with complex partial seizures, not intractable, without status epilepticus: Secondary | ICD-10-CM

## 2016-03-03 DIAGNOSIS — M47816 Spondylosis without myelopathy or radiculopathy, lumbar region: Secondary | ICD-10-CM

## 2016-03-03 LAB — CBG MONITORING, ED: GLUCOSE-CAPILLARY: 101 mg/dL — AB (ref 65–99)

## 2016-03-03 MED ORDER — ZONISAMIDE 100 MG PO CAPS: ORAL_CAPSULE | ORAL | 6 refills | Status: DC

## 2016-03-03 NOTE — Telephone Encounter (Signed)
Pt notified rx was printed and faxed to pharmacy.

## 2016-03-03 NOTE — Progress Notes (Signed)
NEUROLOGY FOLLOW UP OFFICE NOTE  Don Reed [id number]  HISTORY OF PRESENT ILLNESS: I had the pleasure of seeing Don Reed in follow-up in the neurology clinic on 03/03/2016.  The patient was last seen 2-1/2 weeks ago for recurrent seizures and returns for an earlier visit after he was in the ER with several seizures last 02/29/16. His fiancee had witnessed 3 seizures and he may have had another one while she was out, he has a period of time he could not remember. He had urinary incontinence, no tongue biting. He had good sleep the night prior but felt sick when he woke up. He started having the gustatory hallucination he typically has prior to a seizure and was told he had back to back seizures. He was back to baseline in the ER, he denied missing medications. CBC, CMP, and urinalysis was unremarkable in the ER. He was discharged home on higher dose Vimpat 200mg  BID, but states he ran out of medication last Sunday night.   His 1-hour sleep-deprived EEG was normal. He is scheduled for the brain MRI on Monday.  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 He states that at age 60, he was having episodes of slurred speech, falls, recurrent syncope, and headaches, and was found to have the brain tumor. He recalls when he was younger, his head kept turning to one side and he would try to stop it. After the surgery, he did fairly well except for frequent headaches, then had his first seizure at age 30 or 30. He describes seizures as starting with back pain, then he gets a metallic taste in his mouth, his head starts hurting more, then he gets disoriented and has been told he would become unresponsive then proceed to generalized shaking, at times with urinary incontinence. He lives with his fiancee and brother, and denies any isolated staring spells. He has been told he has seizures in his sleep. After a  seizure, he denies any focal weakness, he can hear people around him but has difficulty getting his words out. He also has some nausea. He has fallen twice this year with his seizures, he was in the ER on 09/05/15 and 12/02/15 for seizure, during both ER visits, he would have a witnessed seizure in the ER. With the most recent seizure on 12/02/15, he had ran out of Vimpat. He has been taking Vimpat 100mg  BID since 2015, on Keppra 1500mg  BID since 2011. He recalls taking Depakote and Topamax in the past, with cognitive side effects. He continues to report memory issues, "I can't think too good." He feels his left side is different, with numbness in his left arm and leg. He had cervical fusion surgery and has been told this is the cause of his left-sided symptoms. His last shunt revision was in 1999. 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   He has a history of chronic headaches, with throbbing headaches occurring around twice a week over the frontal and temporal regions, with associated nausea, vomiting, photo/phonophobia. They would last for 1 to 1-1/2 days, he does not take any medication because he feels they have provoked seizures in the past. He reports taking an unrecalled headache preventative medication in [place] which was helpful. He denies any dizziness, bowel/bladder dysfunction, no myoclonic jerks. He has occasional twitching in an arm or leg. He has chronic  neck and back pain.   Epilepsy Risk Factors:  His father had generalized convulsions, his mother had seizures as well. He has a history of benign brain tumor s/p VP shunt, last revision in 1999. He reports several head injuries with loss of consciousness, most recently when hit in the face by a ball a few years ago. Otherwise he had a normal birth and early development.  There is no history of febrile convulsions, CNS infections such as meningitis/encephalitis.  Prior AEDs: Depakote, Topamax  No prior EEGs or MRI available for  review. I personally reviewed head CT without contrast done which did not show any acute changes. There was previous occipital craniotomy seen, VP shunt entering through a right-sided approach with asymmetric decompression of the lateral ventricles, right greater than left, stable dystrophic calcifications in the posterior fossa.  PAST MEDICAL HISTORY: Past Medical History:  Diagnosis Date  . Asthma   . Chronic back pain   . Hydrocephalus   . Migraine   . Seizure ([place])   . Seizures ([place])   . Vision loss     MEDICATIONS: Current Outpatient Prescriptions on File Prior to Visit  Medication Sig Dispense Refill  .      . diphenhydramine-acetaminophen (TYLENOL PM) 25-500 MG TABS tablet Take 2 tablets by mouth 3 (three) times daily as needed (pain).    . Lacosamide 100 MG TABS Take 2 tablets (200 mg total) by mouth 2 (two) times daily (Taking 1 tablet twice a day) 60 tablet 5  . levETIRAcetam (KEPPRA) 750 MG tablet Take 2 tablets (1,500 mg total) by mouth 2 (two) times daily. 120 tablet 11  . Multiple Vitamins-Minerals (MULTIVITAMIN ADULT PO) Take 1 tablet by mouth daily.    Marland Kitchen PROVENTIL HFA 108 (90 BASE) MCG/ACT inhaler Inhale 2 puffs into the lungs every 6 (six) hours as needed for shortness of breath.   3   No current facility-administered medications on file prior to visit.     ALLERGIES: Allergies  Allergen Reactions  . Shellfish Allergy Anaphylaxis    Cannot breathe   . Amoxicillin Itching    Has patient had a PCN reaction causing immediate rash, facial/tongue/throat swelling, SOB or lightheadedness with hypotension: No Has patient had a PCN reaction causing severe rash involving mucus membranes or skin necrosis: No Has patient had a PCN reaction that required hospitalization No Has patient had a PCN reaction occurring within the last 10 years: Yes If all of the above answers are "NO", then may proceed with Cephalosporin use.   . Vancomycin Itching    FAMILY HISTORY: Family  History  Problem Relation Age of Onset  . Hypertension Mother   . Seizures Mother   . Cataracts Mother   . Stroke Mother   . Stroke Father   . Seizures Father   . Scoliosis Sister   . Vision loss Sister   . Vision loss Brother   . Diabetes Brother   . Leukemia Brother     SOCIAL HISTORY: Social History   Social History  . Marital status: Legally Separated    Spouse name: N/A  . Number of children: 2  . Years of education: GED   Occupational History  .  Industries [employer]   Social History Main Topics  . Smoking status: Never Smoker  . Smokeless tobacco: Never Used  . Alcohol use No  . Drug use: No  . Sexual activity: Not on file   Other Topics Concern  . Not on file   Social  History Narrative   Patient lives at home with family.   Caffeine Use: 1-3 time weekly    REVIEW OF SYSTEMS: Constitutional: No fevers, chills, or sweats, no generalized fatigue, change in appetite Eyes: No visual changes, double vision, eye pain Ear, nose and throat: No hearing loss, ear pain, nasal congestion, sore throat Cardiovascular: No chest pain, palpitations Respiratory:  No shortness of breath at rest or with exertion, wheezes GastrointestinaI: No nausea, vomiting, diarrhea, abdominal pain, fecal incontinence Genitourinary:  No dysuria, urinary retention or frequency Musculoskeletal:  No neck pain, back pain Integumentary: No rash, pruritus, skin lesions Neurological: as above Psychiatric: No depression, insomnia, anxiety Endocrine: No palpitations, fatigue, diaphoresis, mood swings, change in appetite, change in weight, increased thirst Hematologic/Lymphatic:  No anemia, purpura, petechiae. Allergic/Immunologic: no itchy/runny eyes, nasal congestion, recent allergic reactions, rashes  PHYSICAL EXAM: Vitals:   03/03/16 1407  BP: 132/76  Pulse: 66  Temp: 98.1 F (36.7 C)   General: No acute distress Head:  Normocephalic/atraumatic Neck: supple, no paraspinal tenderness,  full range of motion Back: No paraspinal tenderness Heart: regular rate and rhythm Lungs: Clear to auscultation bilaterally. Vascular: No carotid bruits. Skin/Extremities: No rash, no edema Neurological Exam: Mental status: alert and oriented to person, place, and time, no dysarthria or aphasia, Fund of knowledge is appropriate.  Recent and remote memory are intact. Attention and concentration are normal.    Able to name objects and repeat phrases. Cranial nerves: CN I: not tested CN II: pupils equal, briskly reactive on left, sluggish on right, visual acuity: hand movements on right, visual fields intact (unchanged) CN III, IV, VI:  full range of motion, no ptosis, nystagmoid eye movements when returning to central field of vision (similar to prior) CN V: facial sensation intact CN VII: upper and lower face symmetric CN VIII: hearing intact to finger rub CN IX, X: gag intact, uvula midline CN XI: sternocleidomastoid and trapezius muscles intact CN XII: tongue midline Bulk & Tone: normal, no fasciculations. Motor: 5/5 throughout with no pronator drift. 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 EEG normal, MRI scheduled for Monday. He had a cluster of seizures last 02/29/16. He was instructed to increase Vimpat to 200mg  BID but ran out 2 days ago. He will pick up Vimpat prescription today. We discussed previous plan to start Zonisamide for headache and seizure prophylaxis, versus increasing Vimpat to 200mg  BID. He would like to start  Zonisamide. We discussed increasing Zonisamide to therapeutic dose before starting to taper Vimpat. He will start Zonisamide 100mg  qhs x 2 weeks, then increase every 2 weeks to 300mg  qhs. Continue Keppra 1500mg  BID and Vimpat 100mg  BID. On his next visit, we will start tapering off Vimpat to streamline his medications. He does not drive and is aware of [place] driving laws. He will follow-up in 3 months and knows to call for any changes.   Thank you for allowing me to participate in his care.  Please do not hesitate to call for any questions or concerns.  The duration of this appointment visit was 25 minutes  of face-to-face time with the patient.  Greater than 50% of this time was spent in counseling, explanation of diagnosis, planning of further management, and coordination of care.   Ellouise Newer, M.D.   CC: Dr. Criss Rosales

## 2016-03-03 NOTE — Patient Instructions (Addendum)
1. Continue Keppra 750mg : take 2 tablets twice a day 2. Continue Vimpat 100mg : Take 1 tablet twice a day 3. Start Zonisamide 100mg : Take 1 capsule every night for 2 weeks, then increase to 2 capsules every night for 2 weeks, then increase to 3 capsules every night and continue 4. Follow-up in 3 months, call for any problems  Seizure Precautions: 1. If medication has been prescribed for you to prevent seizures, take it exactly as directed.  Do not stop taking the medicine without talking to your doctor first, even if you have not had a seizure in a long time.   2. Avoid activities in which a seizure would cause danger to yourself or to others.  Don't operate dangerous machinery, swim alone, or climb in high or dangerous places, such as on ladders, roofs, or girders.  Do not drive unless your doctor says you may.  3. If you have any warning that you may have a seizure, lay down in a safe place where you can't hurt yourself.    4.  No driving for 6 months from last seizure, as per [facility].   Please refer to the following link on the [place] website for more information: http://www.epilepsyfoundation.org/answerplace/[email or web address]/driving/drivingu.cfm   5.  Maintain good sleep hygiene. Avoid alcohol.  6.  Notify your neurology if you are planning pregnancy or if you become pregnant.  7.  Contact your doctor if you have any problems that may be related to the medicine you are taking.  8.  Call 911 and bring the patient back to the ED if:        A.  The seizure lasts longer than 5 minutes.       B.  The patient doesn't awaken shortly after the seizure  C.  The patient has new problems such as difficulty seeing, speaking or moving  D.  The patient was injured during the seizure  E.  The patient has a temperature over 102 F (39C)  F.  The patient vomited and now is having trouble breathing

## 2016-03-03 NOTE — Telephone Encounter (Signed)
Unable to locate rx so printed and faxed.

## 2016-03-03 NOTE — Telephone Encounter (Signed)
Don Reed March 24, 1986. He went to his pharmacy to pick up his new medication and he said they didn't have it yet? His number is [id number]. Thank you

## 2016-03-12 ENCOUNTER — Ambulatory Visit
Admission: RE | Admit: 2016-03-12 | Discharge: 2016-03-12 | Disposition: A | Discharge status: Home / Self Care | Payer: Medicaid Other | Source: Ambulatory Visit | Attending: Neurosurgery | Admitting: Neurosurgery

## 2016-03-12 DIAGNOSIS — M47816 Spondylosis without myelopathy or radiculopathy, lumbar region: Secondary | ICD-10-CM

## 2016-04-30 ENCOUNTER — Ambulatory Visit: Payer: Medicaid Other | Admitting: Neurology

## 2016-06-15 ENCOUNTER — Encounter: Payer: Self-pay | Admitting: Neurology

## 2016-06-15 ENCOUNTER — Ambulatory Visit (INDEPENDENT_AMBULATORY_CARE_PROVIDER_SITE_OTHER): Payer: Medicaid Other | Admitting: Neurology

## 2016-06-15 VITALS — BP 126/76 | HR 77 | Ht 73.0 in | Wt 211.0 lb

## 2016-06-15 DIAGNOSIS — G40201 Localization-related (focal) (partial) symptomatic epilepsy and epileptic syndromes with complex partial seizures, not intractable, with status epilepticus: Secondary | ICD-10-CM | POA: Diagnosis not present

## 2016-06-15 DIAGNOSIS — G43009 Migraine without aura, not intractable, without status migrainosus: Secondary | ICD-10-CM | POA: Diagnosis not present

## 2016-06-15 DIAGNOSIS — Z982 Presence of cerebrospinal fluid drainage device: Secondary | ICD-10-CM

## 2016-06-15 MED ORDER — LEVETIRACETAM 750 MG PO TABS: 1500.0000 mg | ORAL_TABLET | Freq: Two times a day (BID) | ORAL | 11 refills | Status: DC

## 2016-06-15 MED ORDER — ZONISAMIDE 100 MG PO CAPS: ORAL_CAPSULE | ORAL | 6 refills | Status: DC

## 2016-06-15 NOTE — Progress Notes (Signed)
NEUROLOGY FOLLOW UP OFFICE NOTE  Don Reed [id number]  HISTORY OF PRESENT ILLNESS: I had the pleasure of seeing Don Reed in follow-up in the neurology clinic on 06/15/2016.  The patient was last seen 3 months ago for seizures. On his last visit, he continued to report seizures on Keppra and Vimpat. He was discharged home on a higher dose of Vimpat 200mg  BID, but has only been taking 100mg  BID. He continues to take Keppra 1500mg  BID. On his last visit, we agreed to start Zonisamide for both seizure and migraine prophylaxis. He is now on Zonisamide 300mg  qhs. He has not had any seizures since 02/29/16. He denies any side effects on medications, but does feel that his memory is problematic. He has noticed an improvement in the headaches, he is not having them as often, and they are more brief. He denies any gustatory hallucinations, focal numbness/tingling/weakness, myoclonic jerks. He had an MRI brain with and without contrast which I personally reviewed, no acute changes seen. There were stable postsurgical changes in the posterior fossa related to resection of previous mass. 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 No recurrent mass or abnormal enhancement seen. There is a right transfrontal ventricular shunt seen terminating within the right ventricle. Hippocami symmetric.   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 He states that at age 61, he was having episodes of slurred speech, falls, recurrent syncope, and headaches, and was found to have the brain tumor. He recalls when he was younger, his head kept turning to one side and he would try to stop it. After the surgery, he did fairly well except for frequent headaches, then had his first seizure at age 52 or 41. He describes seizures as starting with back pain, then  he gets a metallic taste in his mouth, his head starts hurting more, then he gets disoriented and has been told he would become unresponsive then proceed to generalized shaking, at times with urinary incontinence. He lives with his fiancee and brother, and denies any isolated staring spells. He has been told he has seizures in his sleep. After a seizure, he denies any focal weakness, he can hear people around him but has difficulty getting his words out. He also has some nausea. He has fallen twice this year with his seizures, he was in the ER on 09/05/15 and 12/02/15 for seizure, during both ER visits, he would have a witnessed seizure in the ER. With the most recent seizure on 12/02/15, he had ran out of Vimpat. He has been taking Vimpat 100mg  BID since 2015, on Keppra 1500mg  BID since 2011. He recalls taking Depakote and Topamax in the past, with cognitive side effects. He continues to report memory issues, "I can't think too good." He feels his left side is different, with numbness in his left arm and leg. He had cervical fusion surgery and has been told this is the cause of his left-sided symptoms. His last shunt revision was in 1999. 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   He has a history of chronic headaches, with throbbing headaches occurring around twice a week over the frontal and temporal regions, with associated nausea, vomiting, photo/phonophobia. They would last for 1 to 1-1/2 days, he does not take any medication because he feels they have provoked seizures  in the past. He reports taking an unrecalled headache preventative medication in [place] which was helpful. He denies any dizziness, bowel/bladder dysfunction, no myoclonic jerks. He has occasional twitching in an arm or leg. He has chronic neck and back pain.   Epilepsy Risk Factors:  His father had generalized convulsions, his mother had seizures as well. He has a history of benign brain tumor s/p VP shunt, last revision in 1999.  He reports several head injuries with loss of consciousness, most recently when hit in the face by a ball a few years ago. Otherwise he had a normal birth and early development.  There is no history of febrile convulsions, CNS infections such as meningitis/encephalitis.  Prior AEDs: Depakote, Topamax  No prior EEGs or MRI available for review. I personally reviewed head CT without contrast done which did not show any acute changes. There was previous occipital craniotomy seen, VP shunt entering through a right-sided approach with asymmetric decompression of the lateral ventricles, right greater than left, stable dystrophic calcifications in the posterior fossa.  PAST MEDICAL HISTORY: Past Medical History:  Diagnosis Date  . Asthma   . Chronic back pain   . Hydrocephalus   . Migraine   . Seizure ([place])   . Seizures ([place])   . Vision loss     MEDICATIONS:  Outpatient Encounter Prescriptions as of 06/15/2016  Medication Sig  . clonazePAM (KLONOPIN) 0.5 MG tablet Take 1 tablet (0.5 mg total) by mouth 2 (two) times daily.  . diphenhydramine-acetaminophen (TYLENOL PM) 25-500 MG TABS tablet Take 2 tablets by mouth 3 (three) times daily as needed (pain).  . Lacosamide 100 MG TABS Take 2 tablets (200 mg total) by mouth 2 (two) times daily.  Marland Kitchen levETIRAcetam (KEPPRA) 750 MG tablet Take 2 tablets (1,500 mg total) by mouth 2 (two) times daily.  . Multiple Vitamins-Minerals (MULTIVITAMIN ADULT PO) Take 1 tablet by mouth daily.  Marland Kitchen PROVENTIL HFA 108 (90 BASE) MCG/ACT inhaler Inhale 2 puffs into the lungs every 6 (six) hours as needed for shortness of breath.   . zonisamide (ZONEGRAN) 100 MG capsule Take 1 capsule every night for 2 weeks, then increase to 2 capsules every night for 2 weeks, then increase to 3 capsules every night and continue   No facility-administered encounter medications on file as of 06/15/2016.     ALLERGIES: Allergies  Allergen Reactions  . Shellfish Allergy Anaphylaxis     Cannot breathe   . Amoxicillin Itching    Has patient had a PCN reaction causing immediate rash, facial/tongue/throat swelling, SOB or lightheadedness with hypotension: No Has patient had a PCN reaction causing severe rash involving mucus membranes or skin necrosis: No Has patient had a PCN reaction that required hospitalization No Has patient had a PCN reaction occurring within the last 10 years: Yes If all of the above answers are "NO", then may proceed with Cephalosporin use.   . Vancomycin Itching    FAMILY HISTORY: Family History  Problem Relation Age of Onset  . Hypertension Mother   . Seizures Mother   . Cataracts Mother   . Stroke Mother   . Stroke Father   . Seizures Father   . Scoliosis Sister   . Vision loss Sister   . Vision loss Brother   . Diabetes Brother   . Leukemia Brother     SOCIAL HISTORY: Social History   Social History  . Marital status: Legally Separated    Spouse name: N/A  . Number of children: 2  .  Years of education: GED   Occupational History  .  Industries [employer]   Social History Main Topics  . Smoking status: Never Smoker  . Smokeless tobacco: Never Used  . Alcohol use No  . Drug use: No  . Sexual activity: Not on file   Other Topics Concern  . Not on file   Social History Narrative   Patient lives at home with family.   Caffeine Use: 1-3 time weekly    REVIEW OF SYSTEMS: Constitutional: No fevers, chills, or sweats, no generalized fatigue, change in appetite Eyes: No visual changes, double vision, eye pain Ear, nose and throat: No hearing loss, ear pain, nasal congestion, sore throat Cardiovascular: No chest pain, palpitations Respiratory:  No shortness of breath at rest or with exertion, wheezes GastrointestinaI: No nausea, vomiting, diarrhea, abdominal pain, fecal incontinence Genitourinary:  No dysuria, urinary retention or frequency Musculoskeletal:  No neck pain, back pain Integumentary: No rash, pruritus, skin  lesions Neurological: as above Psychiatric: No depression, insomnia, anxiety Endocrine: No palpitations, fatigue, diaphoresis, mood swings, change in appetite, change in weight, increased thirst Hematologic/Lymphatic:  No anemia, purpura, petechiae. Allergic/Immunologic: no itchy/runny eyes, nasal congestion, recent allergic reactions, rashes  PHYSICAL EXAM: Vitals:   06/15/16 1346  BP: 126/76  Pulse: 77   General: No acute distress Head:  Normocephalic/atraumatic Neck: supple, no paraspinal tenderness, full range of motion Back: No paraspinal tenderness Heart: regular rate and rhythm Lungs: Clear to auscultation bilaterally. Vascular: No carotid bruits. Skin/Extremities: No rash, no edema Neurological Exam: Mental status: alert and oriented to person, place, and time, no dysarthria or aphasia, Fund of knowledge is appropriate.  Recent and remote memory are intact. Attention and concentration are normal.    Able to name objects and repeat phrases. Cranial nerves: CN I: not tested CN II: pupils equal, briskly reactive on left, sluggish on right, visual acuity: hand movements on right, visual fields intact (unchanged) CN III, IV, VI:  full range of motion, no ptosis, nystagmoid eye movements when returning to central field of vision (similar to prior) CN V: facial sensation intact CN VII: upper and lower face symmetric CN VIII: hearing intact to finger rub CN IX, X: gag intact, uvula midline CN XI: sternocleidomastoid and trapezius muscles intact CN XII: tongue midline Bulk & Tone: normal, no fasciculations. Motor: 5/5 throughout with no pronator drift. 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 EEG normal, MRI no acute changes with postsurgical posterior fossa changes seen, right transfrontal shunt in place. He denies any further seizures since 02/29/16. Zonisamide was added on his last visit, continue 300mg  qhs. Headaches are better as well. He will continue Keppra 1500mg  BID. He is reporting some memory problems, which may relate to multiple AEDs, he will start tapering off Vimpat by 1/2 tablet every week. He is aware of risks of breakthrough seizures with any medication adjustment. He does not drive and is aware of [place] driving laws. He will follow-up in 3 months and knows to call for any changes.   Thank you for allowing me to participate in his  care.  Please do not hesitate to call for any questions or concerns.  The duration of this appointment visit was 25 minutes of face-to-face time with the patient.  Greater than 50% of this time was spent in counseling, explanation of diagnosis, planning of further management, and coordination of care.   Ellouise Newer, M.D.   CC: Dr. Criss Rosales

## 2016-06-15 NOTE — Patient Instructions (Signed)
1. Continue Keppra 750mg : Take 2 tablets twice a day 2. Continue Zonisamide 100mg : Take 3 capsules at night 3. Start reducing Vimpat 100mg : Take 1/2 tab in AM, 1 tablet in PM for 1 week, then reduce to 1/2 tablet twice a day for a week, then reduce to 1/2 tablet at night for a week, then stop. 4. Follow-up in 3 months, call for any changes  Seizure Precautions: 1. If medication has been prescribed for you to prevent seizures, take it exactly as directed.  Do not stop taking the medicine without talking to your doctor first, even if you have not had a seizure in a long time.   2. Avoid activities in which a seizure would cause danger to yourself or to others.  Don't operate dangerous machinery, swim alone, or climb in high or dangerous places, such as on ladders, roofs, or girders.  Do not drive unless your doctor says you may.  3. If you have any warning that you may have a seizure, lay down in a safe place where you can't hurt yourself.    4.  No driving for 6 months from last seizure, as per [facility].   Please refer to the following link on the [facility] website for more information: http://www.epilepsyfoundation.org/answerplace/Social/driving/drivingu.cfm   5.  Maintain good sleep hygiene. Avoid alcohol.  6.  Contact your doctor if you have any problems that may be related to the medicine you are taking.  7.  Call 911 and bring the patient back to the ED if:        A.  The seizure lasts longer than 5 minutes.       B.  The patient doesn't awaken shortly after the seizure  C.  The patient has new problems such as difficulty seeing, speaking or moving  D.  The patient was injured during the seizure  E.  The patient has a temperature over 102 F (39C)  F.  The patient vomited and now is having trouble breathing

## 2016-09-11 ENCOUNTER — Ambulatory Visit (INDEPENDENT_AMBULATORY_CARE_PROVIDER_SITE_OTHER): Payer: Medicaid Other | Admitting: Neurology

## 2016-09-11 ENCOUNTER — Encounter: Payer: Self-pay | Admitting: Neurology

## 2016-09-11 VITALS — BP 122/70 | HR 51 | Temp 98.1°F | Resp 16 | Ht 72.0 in | Wt 197.6 lb

## 2016-09-11 DIAGNOSIS — G43009 Migraine without aura, not intractable, without status migrainosus: Secondary | ICD-10-CM | POA: Diagnosis not present

## 2016-09-11 DIAGNOSIS — G40201 Localization-related (focal) (partial) symptomatic epilepsy and epileptic syndromes with complex partial seizures, not intractable, with status epilepticus: Secondary | ICD-10-CM | POA: Diagnosis not present

## 2016-09-11 DIAGNOSIS — Z982 Presence of cerebrospinal fluid drainage device: Secondary | ICD-10-CM | POA: Diagnosis not present

## 2016-09-11 MED ORDER — ZONISAMIDE 100 MG PO CAPS: ORAL_CAPSULE | ORAL | 3 refills | Status: DC

## 2016-09-11 MED ORDER — LEVETIRACETAM 750 MG PO TABS: 1500.0000 mg | ORAL_TABLET | Freq: Two times a day (BID) | ORAL | 3 refills | Status: DC

## 2016-09-11 NOTE — Progress Notes (Signed)
NEUROLOGY FOLLOW UP OFFICE NOTE  Don Reed [id number]  HISTORY OF PRESENT ILLNESS: I had the pleasure of seeing Don Reed in follow-up in the neurology clinic on 09/11/2016. The patient was last seen 3 months ago for seizures. On his last visit, Vimpat was tapered off after starting Zonisamide. He is also taking Keppra 1500mg  BID. He denies any seizures since September 2017. The headaches are better with addition of Zonisamide, they are "not as much." The other day he had some pressure on the right side of his head, he usually just lies down and does not take medication because it "messes with me." His memory is still problematic. He has had to do something different at work due to tremors in both hands, he states he can't keep them steady and they are numb. 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 He denies any falls.   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 He states that at age 86, he was having episodes of slurred speech, falls, recurrent syncope, and headaches, and was found to have the brain tumor. He recalls when he was younger, his head kept turning to one side and he would try to stop it. After the surgery, he did fairly well except for frequent headaches, then had his first seizure at age 28 or 31. He describes seizures as starting with back pain, then he gets a metallic taste in his mouth, his head starts hurting more, then he gets disoriented and has been told he would become unresponsive then proceed to generalized shaking, at times with urinary incontinence. He lives with his fiancee and brother, and denies any isolated staring spells. He has been told he has seizures in his sleep. After a seizure, he denies any focal weakness, he can hear people around him but has difficulty getting his words out. He also  has some nausea. He has fallen twice this year with his seizures, he was in the ER on 09/05/15 and 12/02/15 for seizure, during both ER visits, he would have a witnessed seizure in the ER. With the most recent seizure on 12/02/15, he had ran out of Vimpat. He has been taking Vimpat 100mg  BID since 2015, on Keppra 1500mg  BID since 2011. He recalls taking Depakote and Topamax in the past, with cognitive side effects. He continues to report memory issues, "I can't think too good." He feels his left side is different, with numbness in his left arm and leg. He had cervical fusion surgery and has been told this is the cause of his left-sided symptoms. His last shunt revision was in 1999. 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   He has a history of chronic headaches, with throbbing headaches occurring around twice a week over the frontal and temporal regions, with associated nausea, vomiting, photo/phonophobia. They would last for 1 to 1-1/2 days, he does not take any medication because he feels they have provoked seizures in the past. He reports taking an unrecalled headache preventative medication in [place] which was helpful. He denies any dizziness, bowel/bladder dysfunction, no myoclonic jerks. He has occasional twitching in an arm or leg. He has chronic neck and back pain.   Epilepsy Risk Factors:  His father had generalized convulsions, his mother had seizures as well. He has  a history of benign brain tumor s/p VP shunt, last revision in 1999. He reports several head injuries with loss of consciousness, most recently when hit in the face by a ball a few years ago. Otherwise he had a normal birth and early development.  There is no history of febrile convulsions, CNS infections such as meningitis/encephalitis.  Prior AEDs: Depakote, Topamax, Vimpat  Diagnostic Data: MRI brain with and without contrast 07/27/2016: no acute changes seen. There were stable postsurgical changes in the posterior fossa  related to resection of previous mass. 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 No recurrent mass or abnormal enhancement seen. There is a right transfrontal ventricular shunt seen terminating within the right ventricle. Hippocami symmetric.   PAST MEDICAL HISTORY: Past Medical History:  Diagnosis Date  . Asthma   . Chronic back pain   . Hydrocephalus   . Migraine   . Seizure ([place])   . Seizures ([place])   . Vision loss     MEDICATIONS:  Outpatient Encounter Prescriptions as of 09/11/2016  Medication Sig  . levETIRAcetam (KEPPRA) 750 MG tablet Take 2 tablets (1,500 mg total) by mouth 2 (two) times daily.  . Multiple Vitamins-Minerals (MULTIVITAMIN ADULT PO) Take 1 tablet by mouth daily.  Marland Kitchen PROVENTIL HFA 108 (90 BASE) MCG/ACT inhaler Inhale 2 puffs into the lungs every 6 (six) hours as needed for shortness of breath.   . zonisamide (ZONEGRAN) 100 MG capsule Take 3 capsules every night and continue  . clonazePAM (KLONOPIN) 0.5 MG tablet Take 1 tablet (0.5 mg total) by mouth 2 (two) times daily. (Patient not taking: Reported on 09/11/2016)  . diphenhydramine-acetaminophen (TYLENOL PM) 25-500 MG TABS tablet Take 2 tablets by mouth 3 (three) times daily as needed (pain).  . Lacosamide 100 MG TABS Take 2 tablets (200 mg total) by mouth 2 (two) times daily. (Patient not taking: Reported on 09/11/2016)   No facility-administered encounter medications on file as of 09/11/2016.     ALLERGIES: Allergies  Allergen Reactions  . Shellfish Allergy Anaphylaxis    Cannot breathe   . Amoxicillin Itching    Has patient had a PCN reaction causing immediate rash, facial/tongue/throat swelling, SOB or lightheadedness with hypotension: No Has patient had a PCN reaction causing severe rash involving mucus membranes or skin necrosis: No Has patient had a PCN reaction that required hospitalization No Has patient had a PCN reaction  occurring within the last 10 years: Yes If all of the above answers are "NO", then may proceed with Cephalosporin use.   . Vancomycin Itching    FAMILY HISTORY: Family History  Problem Relation Age of Onset  . Hypertension Mother   . Seizures Mother   . Cataracts Mother   . Stroke Mother   . Stroke Father   . Seizures Father   . Scoliosis Sister   . Vision loss Sister   . Vision loss Brother   . Diabetes Brother   . Leukemia Brother     SOCIAL HISTORY: Social History   Social History  . Marital status: Legally Separated    Spouse name: N/A  . Number of children: 2  . Years of education: GED   Occupational History  .  Industries [employer]   Social History Main Topics  . Smoking status: Never Smoker  . Smokeless tobacco: Never Used  . Alcohol use No  . Drug use: No  . Sexual activity: Not on file   Other Topics  Concern  . Not on file   Social History Narrative   Patient lives at home with family.   Caffeine Use: 1-3 time weekly    REVIEW OF SYSTEMS: Constitutional: No fevers, chills, or sweats, no generalized fatigue, change in appetite Eyes: No visual changes, double vision, eye pain Ear, nose and throat: No hearing loss, ear pain, nasal congestion, sore throat Cardiovascular: No chest pain, palpitations Respiratory:  No shortness of breath at rest or with exertion, wheezes GastrointestinaI: No nausea, vomiting, diarrhea, abdominal pain, fecal incontinence Genitourinary:  No dysuria, urinary retention or frequency Musculoskeletal:  No neck pain, +back pain Integumentary: No rash, pruritus, skin lesions Neurological: as above Psychiatric: No depression, insomnia, anxiety Endocrine: No palpitations, fatigue, diaphoresis, mood swings, change in appetite, change in weight, increased thirst Hematologic/Lymphatic:  No anemia, purpura, petechiae. Allergic/Immunologic: no itchy/runny eyes, nasal congestion, recent allergic reactions, rashes  PHYSICAL  EXAM: Vitals:   09/11/16 1404  BP: 122/70  Pulse: (!) 51  Resp: 16  Temp: 98.1 F (36.7 C)   General: No acute distress Head:  Normocephalic/atraumatic Neck: supple, no paraspinal tenderness, full range of motion Back: No paraspinal tenderness Heart: regular rate and rhythm Lungs: Clear to auscultation bilaterally. Vascular: No carotid bruits. Skin/Extremities: No rash, no edema Neurological Exam: Mental status: alert and oriented to person, place, and time, no dysarthria or aphasia, Fund of knowledge is appropriate.  Recent and remote memory are intact. Attention and concentration are normal.    Able to name objects and repeat phrases. Cranial nerves: CN I: not tested CN II: pupils equal, briskly reactive on left, sluggish on right, visual acuity: hand movements on right, visual fields intact (unchanged) CN III, IV, VI:  full range of motion, no ptosis, nystagmoid eye movements to all directions of gaze (similar to prior) CN V: facial sensation intact CN VII: upper and lower face symmetric CN VIII: hearing intact to finger rub CN IX, X: gag intact, uvula midline CN XI: sternocleidomastoid and trapezius muscles intact CN XII: tongue midline Bulk & Tone: normal, no fasciculations. Motor: 5/5 throughout with no pronator drift. 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 EEG normal, MRI no acute changes with postsurgical posterior fossa changes seen, right transfrontal shunt in place. He denies any further seizures since 02/29/16. He has  tapered off Vimpat, and currently takes Zonisamide 300mg  qhs and Keppra 1500mg  BID. He continues to note some memory issues and hand tremors, continue to monitor, this is likely to encephalomalacia in the cerebellum. He does not drive and is aware of [place] driving laws. He will follow-up in 6 months and knows to call for any changes.   Thank you for allowing me to participate in his care.  Please do not hesitate to call for any questions or concerns.  The duration of this appointment visit was 15 minutes of face-to-face time with the patient.  Greater than 50% of this time was spent in counseling, explanation of diagnosis, planning of further management, and coordination of care.   Ellouise Newer, M.D.   CC: Dr. Criss Rosales

## 2016-09-11 NOTE — Patient Instructions (Signed)
1. Continue Keppra 750mg : Take 2 tablets twice a day 2. Continue Zonisamide 100mg : Take 3 capsules at night 3. Follow-up in 6 months, call for any changes  Seizure Precautions: 1. If medication has been prescribed for you to prevent seizures, take it exactly as directed.  Do not stop taking the medicine without talking to your doctor first, even if you have not had a seizure in a long time.   2. Avoid activities in which a seizure would cause danger to yourself or to others.  Don't operate dangerous machinery, swim alone, or climb in high or dangerous places, such as on ladders, roofs, or girders.  Do not drive unless your doctor says you may.  3. If you have any warning that you may have a seizure, lay down in a safe place where you can't hurt yourself.    4.  No driving for 6 months from last seizure, as per [facility]   Please refer to the following link on the [place] website for more information: http://www.epilepsyfoundation.org/answerplace/Social/driving/drivingu.cfm   5.  Maintain good sleep hygiene. Avoid alcohol.  6.  Contact your doctor if you have any problems that may be related to the medicine you are taking.  7.  Call 911 and bring the patient back to the ED if:        A.  The seizure lasts longer than 5 minutes.       B.  The patient doesn't awaken shortly after the seizure  C.  The patient has new problems such as difficulty seeing, speaking or moving  D.  The patient was injured during the seizure  E.  The patient has a temperature over 102 F (39C)  F.  The patient vomited and now is having trouble breathing

## 2016-09-14 ENCOUNTER — Encounter: Payer: Self-pay | Admitting: Neurology

## 2016-09-28 ENCOUNTER — Telehealth: Payer: Self-pay | Admitting: Neurology

## 2016-09-28 NOTE — Telephone Encounter (Signed)
Will forward message to provider. 

## 2016-09-28 NOTE — Telephone Encounter (Signed)
He is in [place]. He is in the ER now. He had a seizure while there visiting family. The Neurologist that is seeing him now wants to take him off the medication that Dr. Delice Lesch put him on and start him on something else. He does not feel comfortable with that. Please Advise.  [phone])

## 2016-09-29 MED ORDER — ZONISAMIDE 100 MG PO CAPS: ORAL_CAPSULE | ORAL | 3 refills | Status: DC

## 2016-09-29 NOTE — Telephone Encounter (Signed)
He was in [place], started having a headache, back hurting, then family saw him fall with shaking. Went to the ER [facility] in [place]), neurologist wanted to change his medication, they wanted to admit him and put him on Trileptal and take him off both of them. He denied any recent illness, but states the night before they were playing a jelly bean game, did not get good sleep, no missed doses. Instructed him to increase Zonisamide to 400mg  qhs, continue Keppra. Rx sent to pharmacy

## 2016-10-01 ENCOUNTER — Encounter: Payer: Self-pay | Admitting: Neurology

## 2016-10-01 ENCOUNTER — Ambulatory Visit: Payer: Medicaid Other | Admitting: Neurology

## 2016-10-13 ENCOUNTER — Telehealth: Payer: Self-pay | Admitting: Neurology

## 2016-10-13 NOTE — Telephone Encounter (Signed)
Caller: PT  Urgent? No  Reason for the call: PT called to let Dr Delice Lesch know he was doing ok on the new medication amount

## 2016-10-14 NOTE — Telephone Encounter (Signed)
Will forward message to provider. 

## 2017-02-21 ENCOUNTER — Emergency Department (HOSPITAL_COMMUNITY)
Admission: EM | Admit: 2017-02-21 | Discharge: 2017-02-21 | Disposition: A | Discharge status: Home / Self Care | Payer: Medicaid Other | Attending: Emergency Medicine | Admitting: Emergency Medicine

## 2017-02-21 ENCOUNTER — Emergency Department (HOSPITAL_COMMUNITY): Payer: Medicaid Other

## 2017-02-21 ENCOUNTER — Encounter (HOSPITAL_COMMUNITY): Payer: Self-pay | Admitting: Emergency Medicine

## 2017-02-21 DIAGNOSIS — G40309 Generalized idiopathic epilepsy and epileptic syndromes, not intractable, without status epilepticus: Secondary | ICD-10-CM

## 2017-02-21 DIAGNOSIS — G40009 Localization-related (focal) (partial) idiopathic epilepsy and epileptic syndromes with seizures of localized onset, not intractable, without status epilepticus: Secondary | ICD-10-CM | POA: Insufficient documentation

## 2017-02-21 DIAGNOSIS — R569 Unspecified convulsions: Secondary | ICD-10-CM | POA: Diagnosis present

## 2017-02-21 DIAGNOSIS — Z79899 Other long term (current) drug therapy: Secondary | ICD-10-CM | POA: Diagnosis not present

## 2017-02-21 HISTORY — DX: Malignant (primary) neoplasm, unspecified: C80.1

## 2017-02-21 HISTORY — DX: Legal blindness, as defined in USA: H54.8

## 2017-02-21 LAB — COMPREHENSIVE METABOLIC PANEL
ALBUMIN: 4.8 g/dL (ref 3.5–5.0)
ALT: 18 U/L (ref 17–63)
AST: 25 U/L (ref 15–41)
Alkaline Phosphatase: 46 U/L (ref 38–126)
Anion gap: 9 (ref 5–15)
BUN: 13 mg/dL (ref 6–20)
CHLORIDE: 106 mmol/L (ref 101–111)
CO2: 21 mmol/L — ABNORMAL LOW (ref 22–32)
CREATININE: 1.33 mg/dL — AB (ref 0.61–1.24)
Calcium: 9.7 mg/dL (ref 8.9–10.3)
GFR calc Af Amer: >60 mL/min (ref >60)
GLUCOSE: 122 mg/dL — AB (ref 65–99)
Potassium: 3.5 mmol/L (ref 3.5–5.1)
Sodium: 136 mmol/L (ref 135–145)
Total Bilirubin: 0.7 mg/dL (ref 0.3–1.2)
Total Protein: 8.2 g/dL — ABNORMAL HIGH (ref 6.5–8.1)

## 2017-02-21 LAB — CBC WITH DIFFERENTIAL/PLATELET
Basophils Absolute: 0 10*3/uL (ref 0.0–0.1)
Basophils Relative: 0 %
EOS ABS: 0 10*3/uL (ref 0.0–0.7)
EOS PCT: 0 %
HCT: 40.3 % (ref 39.0–52.0)
Hemoglobin: 13.1 g/dL (ref 13.0–17.0)
LYMPHS ABS: 0.8 10*3/uL (ref 0.7–4.0)
Lymphocytes Relative: 7 %
MCH: 25.6 pg — AB (ref 26.0–34.0)
MCHC: 32.5 g/dL (ref 30.0–36.0)
MCV: 78.9 fL (ref 78.0–100.0)
MONO ABS: 0.8 10*3/uL (ref 0.1–1.0)
MONOS PCT: 7 %
Neutro Abs: 9.8 10*3/uL — ABNORMAL HIGH (ref 1.7–7.7)
Neutrophils Relative %: 86 %
PLATELETS: 252 10*3/uL (ref 150–400)
RBC: 5.11 MIL/uL (ref 4.22–5.81)
RDW: 13.3 % (ref 11.5–15.5)
WBC: 11.4 10*3/uL — AB (ref 4.0–10.5)

## 2017-02-21 LAB — CBG MONITORING, ED: Glucose-Capillary: 94 mg/dL (ref 65–99)

## 2017-02-21 LAB — I-STAT TROPONIN, ED: TROPONIN I, POC: 0 ng/mL (ref 0.00–0.08)

## 2017-02-21 MED ORDER — ONDANSETRON 4 MG PO TBDP: 4.0000 mg | ORAL_TABLET | Freq: Three times a day (TID) | ORAL | 0 refills | Status: DC | PRN

## 2017-02-21 MED ORDER — SODIUM CHLORIDE 0.9 % IV SOLN
750.0000 mg | Freq: Once | INTRAVENOUS | Status: AC
  Administered 2017-02-21: 750 mg via INTRAVENOUS
  Filled 2017-02-21: qty 7.5

## 2017-02-21 NOTE — Discharge Instructions (Signed)
Please do not drive, operate heavy machinery, work at heights, go swimming alone, or perform any other activity where he could heart cause harm to yourself or someone else if you were to have a seizure.  Please take your dose of Keppra tonight. You may feel more drowsy than normal as you had Keppra here in the emergency room.  I have given you a prescription for Zofran to help with nausea and vomiting. If you feel noxious before taking her Keppra you may take 1 tablet of Zofran, allow it to dissolve in your mouth, and weight approximately 20 minutes before taking your Keppra. This will help decrease your chances of throwing up your Keppra.  If your chest pain recurs, or you have any concerns please return to the emergency room immediately for evaluation.

## 2017-02-21 NOTE — ED Notes (Signed)
Pt alert  Slow to asnwer questions  No tongue damage  He was incontinent of urine

## 2017-02-21 NOTE — ED Triage Notes (Signed)
Arrived via [facility]. Pt was at church sitting on a bench, began vomiting, had 2 witnessed full body seizures prior to EMS arrival. No seizures with EMS but report agonal respirations. They bagged him for a few minutes and inserted NPA. Pt now CAO x 3.

## 2017-02-21 NOTE — ED Provider Notes (Signed)
[facility] DEPT Provider Note   CSN: [id number] Arrival date & time: 02/21/17  1441     History   Chief Complaint Chief Complaint  Patient presents with  . Seizures    HPI Don Reed is a 31 y.o. 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 According to triage note he began vomiting, then had to "full body was "seizures prior to EMS arrival. EMS reports that initially he had agonal respirations, they inserted an NPA and bagged him for a few minutes.  He reports that he has recently been nauseous and has vomited multiple times today. He reports that he suspects he vomited up his Keppra this morning.  He otherwise reports compliance with his keppra and Zonisamide.  He also reports that yesterday he had chest pain "around his shunt". He denies recent fevers or chills, no diarrhea.  He reports that he is having his normal back and neck pain and that they are unchanged. He says that his nausea is better now and that he is hungry.  He is legally blind but reports his vision is unchanged from his normal.   HPI  Past Medical History:  Diagnosis Date  . Asthma   . Cancer ([place])   . Chronic back pain   . Hydrocephalus   . Legally blind   . Migraine   . Seizure ([place])   . Seizures ([place])   . Vision loss     Patient Active Problem List   Diagnosis Date Noted  . Localization-related (focal) (partial) symptomatic epilepsy and epileptic syndromes with complex partial seizures, not intractable, without status epilepticus ([place]) 02/14/2016  . Migraine without aura and without status migrainosus, not intractable 02/14/2016  . Generalized seizure disorder ([place]) 04/06/2014  . Brain tumor (benign) ([place]) 04/06/2014  . S/P ventricular shunt placement 04/06/2014  . S/P cervical spinal fusion 04/06/2014  . Neck pain 04/06/2014    Past Surgical History:    Procedure Laterality Date  . BRAIN SURGERY  age 67   tumor  . SPINAL FUSION  2011       Home Medications    Prior to Admission medications   Medication Sig Start Date End Date Taking? Authorizing Provider  clonazePAM (KLONOPIN) 0.5 MG tablet Take 1 tablet (0.5 mg total) by mouth 2 (two) times daily. Patient not taking: Reported on 09/11/2016 02/29/16   Sherwood Gambler, MD  diphenhydramine-acetaminophen (TYLENOL PM) 25-500 MG TABS tablet Take 2 tablets by mouth 3 (three) times daily as needed (pain).    [provider]  levETIRAcetam (KEPPRA) 750 MG tablet Take 2 tablets (1,500 mg total) by mouth 2 (two) times daily. 09/11/16   Cameron Sprang, MD  Multiple Vitamins-Minerals (MULTIVITAMIN ADULT PO) Take 1 tablet by mouth daily.    [provider]  ondansetron (ZOFRAN ODT) 4 MG disintegrating tablet Take 1 tablet (4 mg total) by mouth every 8 (eight) hours as needed for nausea or vomiting. 02/21/17   Lorin Glass, PA-C  PROVENTIL HFA 108 (90 BASE) MCG/ACT inhaler Inhale 2 puffs into the lungs every 6 (six) hours as needed for shortness of breath.  11/13/14   [provider]  zonisamide (ZONEGRAN) 100 MG capsule Take 4 capsules at night 09/29/16   Cameron Sprang, MD    Family History Family History  Problem Relation Age of Onset  . Hypertension Mother   . Seizures  Mother   . Cataracts Mother   . Stroke Mother   . Stroke Father   . Seizures Father   . Scoliosis Sister   . Vision loss Sister   . Vision loss Brother   . Diabetes Brother   . Leukemia Brother     Social History Social History  Substance Use Topics  . Smoking status: Never Smoker  . Smokeless tobacco: Never Used  . Alcohol use No     Allergies   Shellfish allergy; Amoxicillin; and Vancomycin   Review of Systems Review of Systems  Constitutional: Negative for chills and fever.  HENT: Negative for ear pain and sore throat.   Eyes: Negative for pain and visual disturbance.   Respiratory: Positive for apnea (After seizure per EMS. ). Negative for cough and shortness of breath.   Cardiovascular: Positive for chest pain. Negative for palpitations.  Gastrointestinal: Positive for nausea and vomiting. Negative for abdominal pain and diarrhea.  Genitourinary: Negative for dysuria and hematuria.  Musculoskeletal: Negative for arthralgias and back pain.  Skin: Negative for color change and rash.  Neurological: Positive for seizures. Negative for syncope and headaches.  All other systems reviewed and are negative.    Physical Exam Updated Vital Signs BP 110/67   Pulse 60   Temp 98.4 F (36.9 C) (Oral)   Resp 19   SpO2 100%   Physical Exam  Constitutional: He appears well-developed and well-nourished.  HENT:  Head: Normocephalic and atraumatic.  Right Ear: External ear normal.  Left Ear: External ear normal.  Nose: Nose normal.  Mouth/Throat: Uvula is midline and oropharynx is clear and moist.  Eyes: Pupils are equal, round, and reactive to light. Conjunctivae and EOM are normal.  Patient is legally blind  Neck: Neck supple. No JVD present.  Cardiovascular: Normal rate, regular rhythm, normal heart sounds, intact distal pulses and normal pulses.   Pulmonary/Chest: Effort normal and breath sounds normal. No accessory muscle usage or stridor. No apnea. No respiratory distress. He has no decreased breath sounds.  Abdominal: Soft. Normal appearance and bowel sounds are normal. There is no hepatosplenomegaly. There is no tenderness. There is no guarding and no CVA tenderness.  Musculoskeletal: He exhibits no edema or deformity.  Neurological: He is alert. No sensory deficit. He exhibits normal muscle tone. Coordination normal.  Mental Status:  Alert, oriented, thought content appropriate, able to give a coherent history. Speech fluent without evidence of aphasia. Able to follow 2 step commands without difficulty.  Cranial Nerves:  II:  Peripheral visual fields  grossly normal, pupils equal, round, reactive to light, vision at his baseline per patient. III,IV, VI: ptosis not present, extra-ocular motions intact bilaterally  V,VII: smile symmetric, facial light touch sensation equal VIII: hearing grossly normal to voice  X: uvula elevates symmetrically  XI: bilateral shoulder shrug symmetric and strong XII: midline tongue extension without fassiculations Motor:  Normal tone. 5/5 in upper and lower extremities bilaterally including strong and equal grip strength and dorsiflexion/plantar flexion, limited ability to lift right leg off bed secondary to chronic pain, unchanged from normal.  CV: distal pulses palpable throughout    Skin: Skin is warm and dry. He is not diaphoretic.  Psychiatric: He has a normal mood and affect. His behavior is normal.  Nursing note and vitals reviewed.    ED Treatments / Results  Labs (all labs ordered are listed, but only abnormal results are displayed) Labs Reviewed  COMPREHENSIVE METABOLIC PANEL - Abnormal; Notable for the following:  Result Value   CO2 21 (*)    Glucose, Bld 122 (*)    Creatinine, Ser 1.33 (*)    Total Protein 8.2 (*)    All other components within normal limits  CBC WITH DIFFERENTIAL/PLATELET - Abnormal; Notable for the following:    WBC 11.4 (*)    MCH 25.6 (*)    Neutro Abs 9.8 (*)    All other components within normal limits  CBG MONITORING, ED  I-STAT TROPONIN, ED    EKG  EKG Interpretation None       Radiology Dg Abd 1 View  Result Date: 02/21/2017 CLINICAL DATA:  Seizure today. History of epilepsy and asthma. Assess shunt integrity. EXAM: ABDOMEN - 1 VIEW COMPARISON:  One view abdomen 08/10/2014. FINDINGS: The visualized bowel gas pattern is normal. Ventricular peritoneal shunt tubing is again noted, tip in the left mid abdomen. Assessment of the shunt is challenging due to the multitude of overlying telemetry leads, although it appears grossly intact. There is no  displaced bowel. Right pelvic calcification appears unchanged. The osseous structures appear unremarkable. IMPRESSION: No acute abdominal findings. The abdominal portions of the VPS appear intact although are not optimally visualized due to overlying telemetry leads. Electronically Signed   By: Richardean Sale M.D.   On: 02/21/2017 15:54   Dg Chest Portable 1 View  Result Date: 02/21/2017 CLINICAL DATA:  Seizure today. Midsternal chest pain. History of epilepsy and asthma. EXAM: PORTABLE CHEST 1 VIEW COMPARISON:  08/10/2014. FINDINGS: 1537 hours. Two views are submitted. The heart size and mediastinal contours are normal. The lungs are clear. There is no pleural effusion or pneumothorax. No acute osseous findings are identified. Ventricular peritoneal shunt tubing is present on the right with calcification around the neck portion. Patient is status post cervical fusion. IMPRESSION: Stable chest. No active cardiopulmonary process. The thoracic portions of the ventriculoperitoneal shunt are intact. Electronically Signed   By: Richardean Sale M.D.   On: 02/21/2017 15:50    Procedures Procedures (including critical care time)  Medications Ordered in ED Medications  levETIRAcetam (KEPPRA) 750 mg in sodium chloride 0.9 % 100 mL IVPB (0 mg Intravenous Stopped 02/21/17 1619)     Initial Impression / Assessment and Plan / ED Course  I have reviewed the triage vital signs and the nursing notes.  Pertinent labs & imaging results that were available during my care of the patient were reviewed by me and considered in my medical decision making (see chart for details).    Patient with no evidence of focal neuro deficits on physical exam and is at mental baseline.  Labs and imaging have been reviewed.  Patient is advised to followup with neurologist in regards to today's event.  As patient suspects he vomited up his keppra this AM he was given his usual dose IV and discharged with zofran to take as needed for  nausea.  He reported chest pain around his shunt, DG 1 view chest and abdomen were obtained with out pneumonia, aspiration, or evidence for cause of his chest pain or nausea.  No obvious cause for his chest pain, EKG obtained, normal troponin. He was advised that if his chest pain recurs he needs to come back for additional evaluation. Unsure what caused his nausea as he has no abdominal pain and it has resolved.   Spoke with patient about activity restrictions, he does not drive as he is legally blind.  Patient verbalizes understanding.  Answered all questions.  Patient is hemodynamically stable and  in no acute distress prior to discharge.   Final Clinical Impressions(s) / ED Diagnoses   Final diagnoses:  Generalized seizure disorder [facility])    New Prescriptions Discharge Medication List as of 02/21/2017  5:32 PM    START taking these medications   Details  ondansetron (ZOFRAN ODT) 4 MG disintegrating tablet Take 1 tablet (4 mg total) by mouth every 8 (eight) hours as needed for nausea or vomiting., Starting Sun 02/21/2017, Print         Lorin Glass, PA-C 02/21/17 1814    Julianne Rice, MD 02/21/17 [phone]

## 2017-02-21 NOTE — ED Notes (Addendum)
Pt reports he takes keppra for seizures, is compliant with meds and has an appt with his neurologist next week. Pt reports last seizure was "months" ago. Pt has hx of brain CA as a child and has a shunt in place. Pt also reports GI illness since Wednesday. Pt reports pain to R side of face from the syncopal episode.

## 2017-02-21 NOTE — ED Notes (Signed)
Patient CBG was 94.

## 2017-03-10 IMAGING — MR MR LUMBAR SPINE W/O CM
4 of 5 series · 26 of 48 positions shown · non-contrast
Comparison: 01/26/2015

CLINICAL DATA: Low back pain. Pain in the right leg and foot.
Numbness in the left leg.

EXAM:
MRI LUMBAR SPINE WITHOUT CONTRAST
TECHNIQUE: Multiplanar, multisequence MR imaging of the lumbar spine was
performed. No intravenous contrast was administered.

[Series 2: T1 · sagittal · 4.0mm · 0.84mm/px · 6 of 14 slices shown (1 of 2)]
[im 1/14]
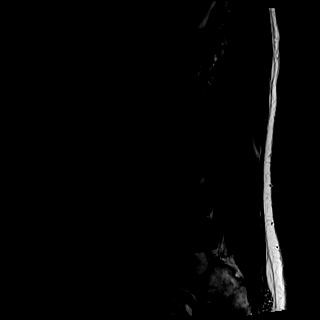
[im 3/14]
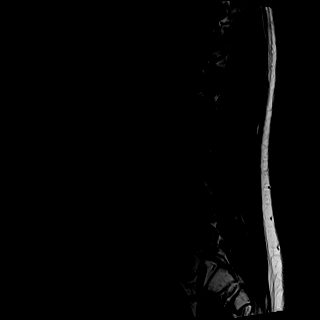
[im 6/14]
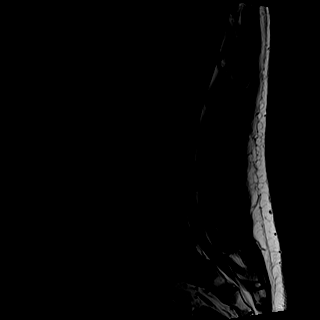
[im 8/14]
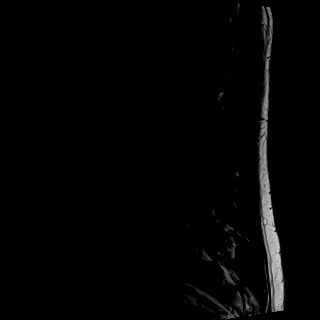
[im 11/14]
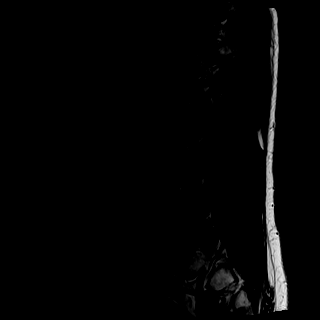
[im 14/14]
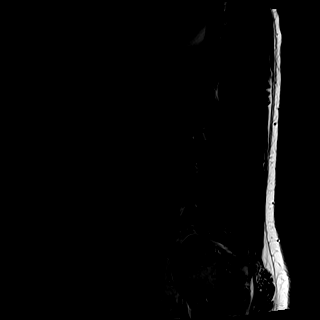

[Series 3: T2 · sagittal · 4.0mm · 0.42mm/px · 6 of 14 slices shown (1 of 2)]
[im 1/14]
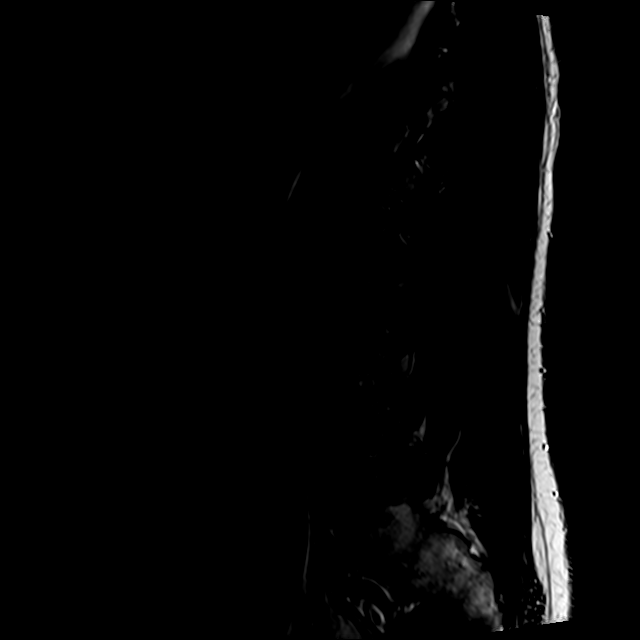
[im 3/14]
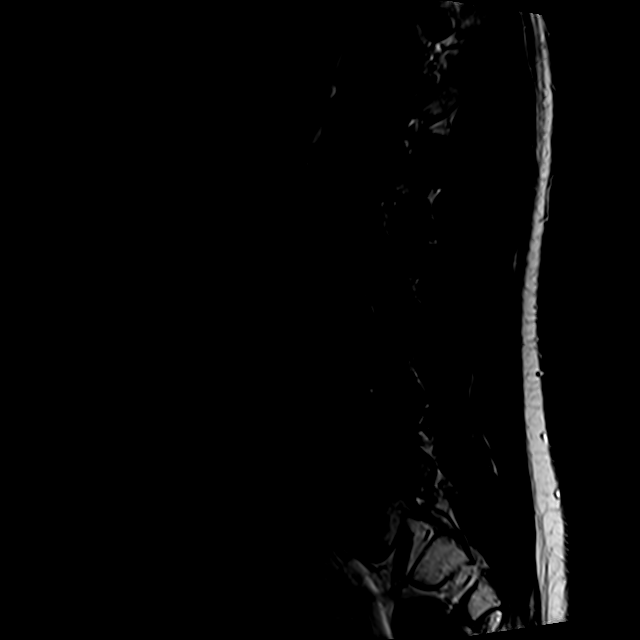
[im 6/14]
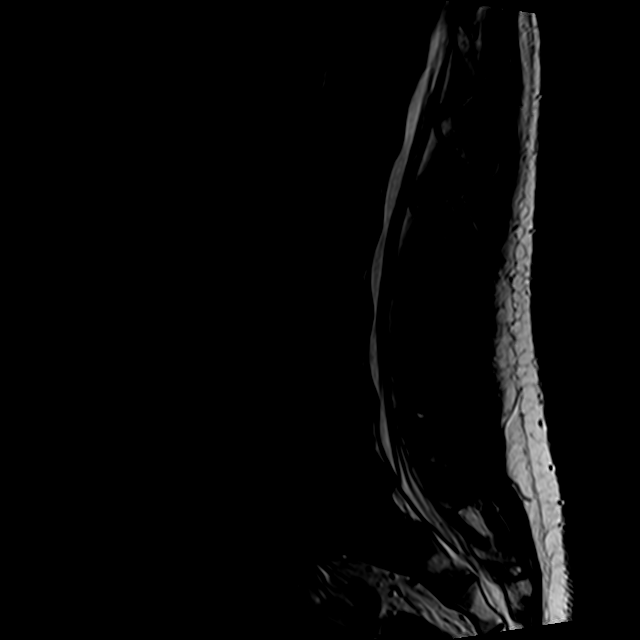
[im 8/14]
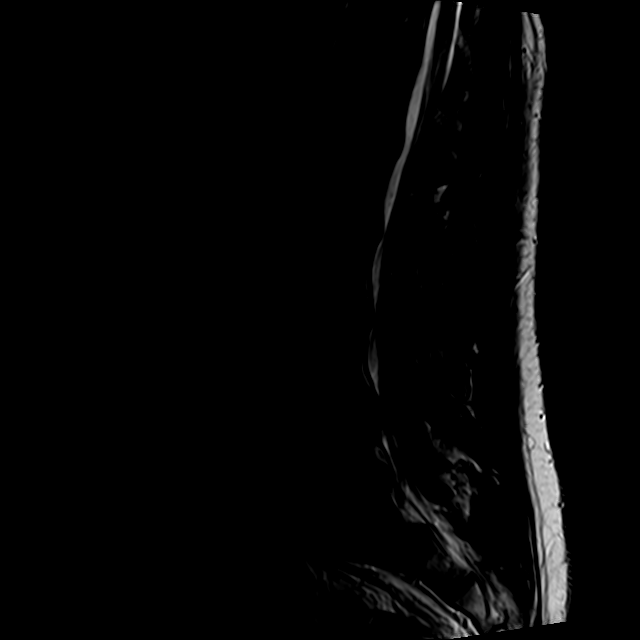
[im 11/14]
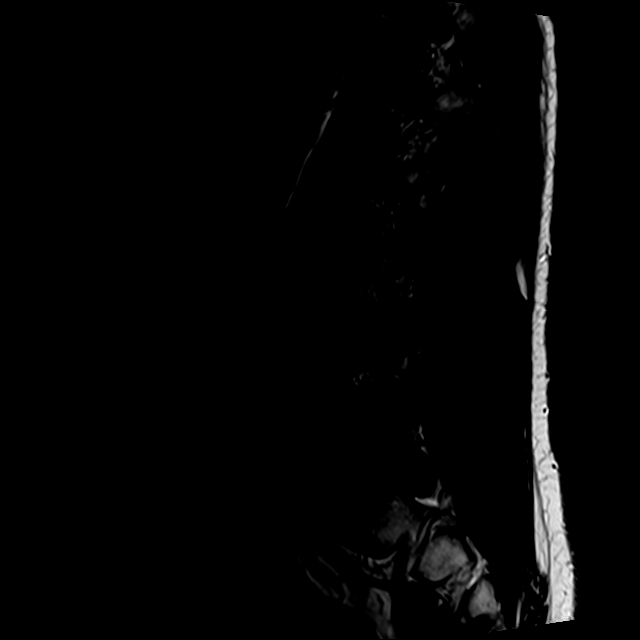
[im 14/14]
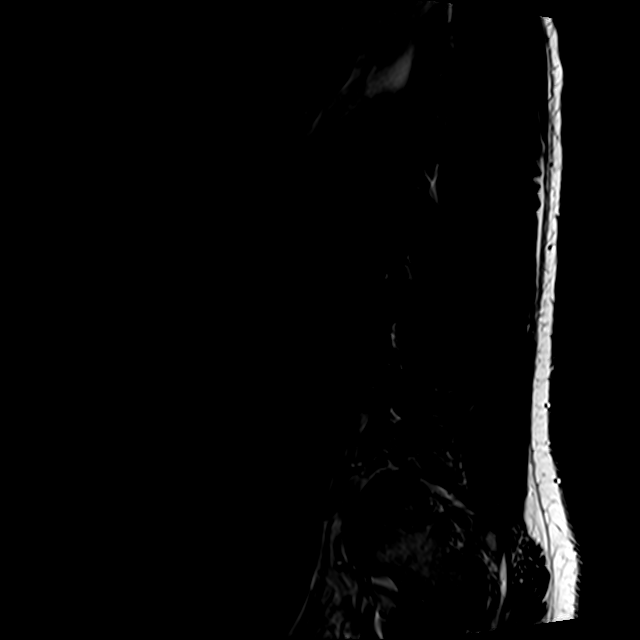

[Series 4: T2 · axial · 4.0mm · 0.74mm/px · z∈[-98,+89]mm · 9 of 36 slices shown (2 of 2)]
[im 1/36]
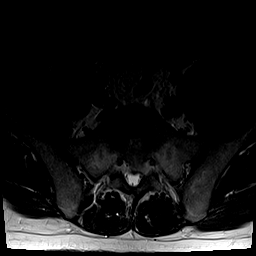
[im 6/36]
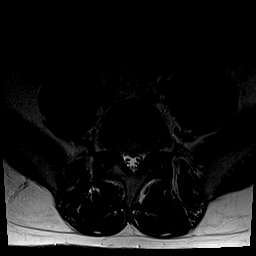
[im 11/36]
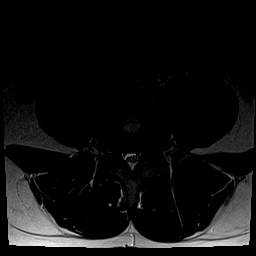
[im 16/36]
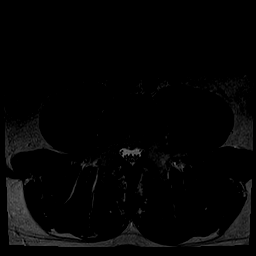
[im 18/36]
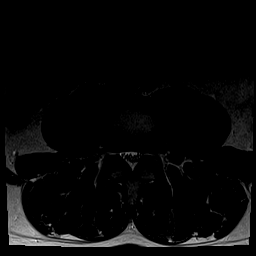
[im 21/36]
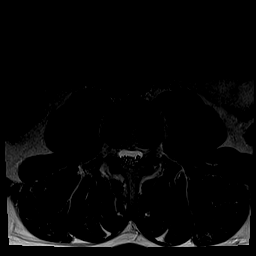
[im 26/36]
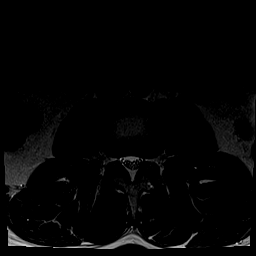
[im 31/36]
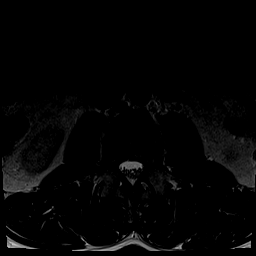
[im 36/36]
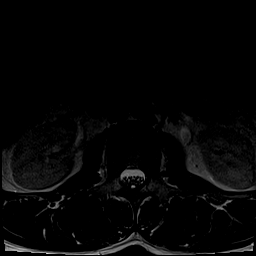

[Series 6: T1 · axial · 4.0mm · 0.37mm/px · z∈[-98,+64]mm · 5 of 36 slices shown (2 of 2)]
[im 1/36]
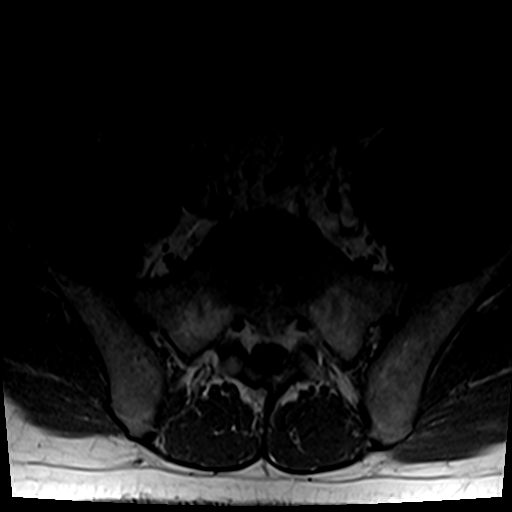
[im 6/36]
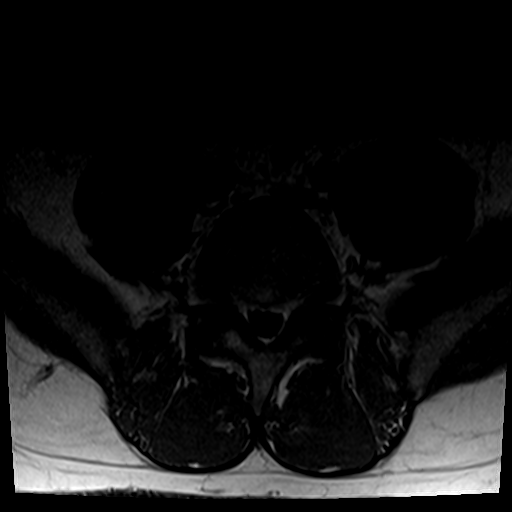
[im 11/36]
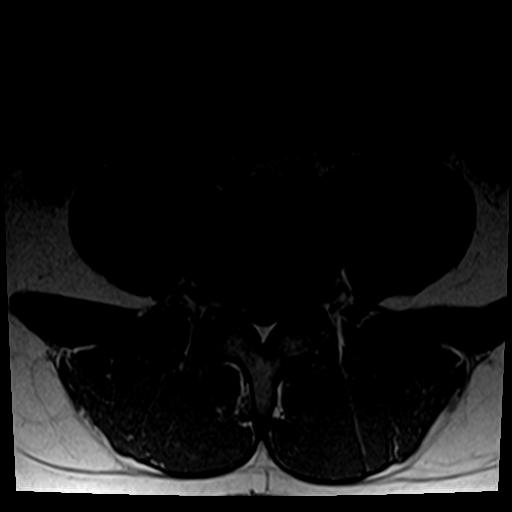
[im 18/36]
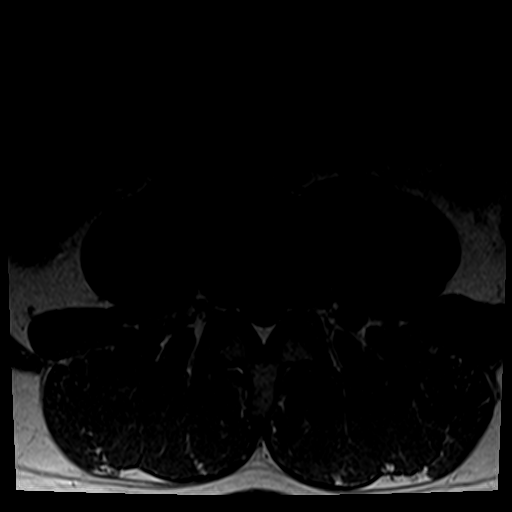
[im 31/36]
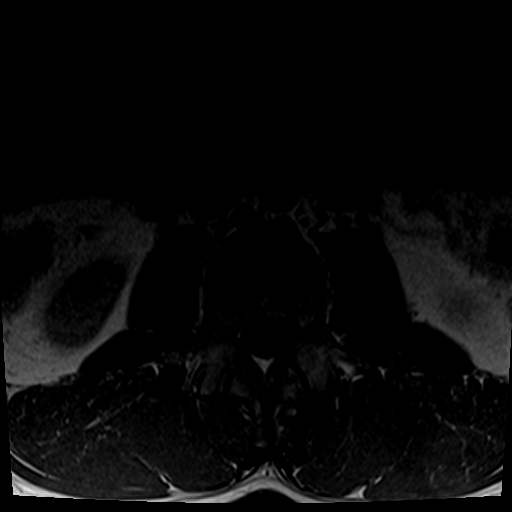

[26 of 48 positions shown; findings below may reference images not displayed]

FINDINGS: Segmentation:  Standard.

Alignment:  Normal.

Vertebrae: Preserved vertebral body heights without evidence of
fracture. No marrow edema or focal osseous lesion identified. Mild
diffuse narrowing of the lumbar spinal canal on a congenital basis
due to short pedicles.

Conus medullaris: Extends to the L1 level and appears normal.

Paraspinal and other soft tissues: Unremarkable.

Disc levels:

L1-2 through L3-4:  No significant findings.

L4-5: Mild disc bulging and congenitally short pedicles result in
mild spinal stenosis and minimal bilateral neural foraminal
narrowing, unchanged.

L5-S1: Disc bulging and congenitally short pedicles result in mild
bilateral neural foraminal stenosis without spinal stenosis,
unchanged.
IMPRESSION: Unchanged appearance of the lumbar spine. Congenitally narrow spinal
canal with mild disc bulging at L4-5 and L5-S1 as above.

## 2017-03-16 ENCOUNTER — Encounter: Payer: Self-pay | Admitting: Neurology

## 2017-03-16 ENCOUNTER — Ambulatory Visit (INDEPENDENT_AMBULATORY_CARE_PROVIDER_SITE_OTHER): Payer: Medicaid Other | Admitting: Neurology

## 2017-03-16 VITALS — BP 92/60 | HR 51 | Ht 71.0 in | Wt 187.0 lb

## 2017-03-16 DIAGNOSIS — R2681 Unsteadiness on feet: Secondary | ICD-10-CM

## 2017-03-16 DIAGNOSIS — Z982 Presence of cerebrospinal fluid drainage device: Secondary | ICD-10-CM

## 2017-03-16 DIAGNOSIS — G43009 Migraine without aura, not intractable, without status migrainosus: Secondary | ICD-10-CM

## 2017-03-16 DIAGNOSIS — G40201 Localization-related (focal) (partial) symptomatic epilepsy and epileptic syndromes with complex partial seizures, not intractable, with status epilepticus: Secondary | ICD-10-CM

## 2017-03-16 MED ORDER — NORTRIPTYLINE HCL 10 MG PO CAPS: ORAL_CAPSULE | ORAL | 6 refills | Status: DC

## 2017-03-16 MED ORDER — LEVETIRACETAM 750 MG PO TABS: 1500.0000 mg | ORAL_TABLET | Freq: Two times a day (BID) | ORAL | 3 refills | Status: DC

## 2017-03-16 MED ORDER — ZONISAMIDE 100 MG PO CAPS: ORAL_CAPSULE | ORAL | 3 refills | Status: DC

## 2017-03-16 NOTE — Progress Notes (Signed)
NEUROLOGY FOLLOW UP OFFICE NOTE  Don Reed [id number]  COB 1985-07-19  HISTORY OF PRESENT ILLNESS: I had the pleasure of seeing Don Reed in follow-up in the neurology clinic on 03/16/2017. The patient was last seen 6 months ago for seizures. He is taking Zonisamide and Keppra. He called our office last April to report a seizure in April while in [place] and went to [facility] in [place]. They wanted to switch his medications, we discussed increasing the Zonisamide to 400mg  qhs instead. He is also taking Keppra 1500mg  BID. He denies any side effects on medications. He was doing well for 4 months until 02/21/17 when he had 2 seizures in one day. He reports being sick for a week with vomiting. He felt dizzy at church and felt his typical aura, tried to eat a cracker then vomited again. He went to the ER and was given Zofran with good effect. He is reporting an increase in headaches occurring every other day. He takes Aleve 2 tabs every 4-6 hours. He has also been having leg and back pain, which cause his headaches to worsen. He denies any falls aside from the seizures, but notices he gets off balance when he walks sometimes. He wears glasses but notices that when he looks at things they look somewhat different, not as clear with an extra circle around objects.   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 He states that at age 3, he was having episodes of slurred speech, falls, recurrent syncope, and headaches, and was found to have the brain tumor. He recalls when he was younger, his head kept turning to one side and he would try to stop it. After the surgery, he did fairly well except for frequent headaches, then had his first seizure at age 31 or 31. He describes seizures as starting with back pain, then he gets a metallic taste in his mouth, his head starts hurting more, then he gets  disoriented and has been told he would become unresponsive then proceed to generalized shaking, at times with urinary incontinence. He lives with his fiancee and brother, and denies any isolated staring spells. He has been told he has seizures in his sleep. After a seizure, he denies any focal weakness, he can hear people around him but has difficulty getting his words out. He also has some nausea. He has fallen twice this year with his seizures, he was in the ER on 09/05/15 and 12/02/15 for seizure, during both ER visits, he would have a witnessed seizure in the ER. With the most recent seizure on 12/02/15, he had ran out of Vimpat. He has been taking Vimpat 100mg  BID since 2015, on Keppra 1500mg  BID since 2011. He recalls taking Depakote and Topamax in the past, with cognitive side effects. He continues to report memory issues, "I can't think too good." He feels his left side is different, with numbness in his left arm and leg. He had cervical fusion surgery and has been told this is the cause of his left-sided symptoms. His last shunt revision was in 1999. 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   He has a history of chronic headaches, with throbbing headaches occurring around twice a week over the frontal and temporal regions, with associated nausea, vomiting, photo/phonophobia. They would last for 1 to 1-1/2 days, he does not  take any medication because he feels they have provoked seizures in the past. He reports taking an unrecalled headache preventative medication in [place] which was helpful. He denies any dizziness, bowel/bladder dysfunction, no myoclonic jerks. He has occasional twitching in an arm or leg. He has chronic neck and back pain.   Epilepsy Risk Factors:  His father had generalized convulsions, his mother had seizures as well. He has a history of benign brain tumor s/p VP shunt, last revision in 1999. He reports several head injuries with loss of consciousness, most recently when hit  in the face by a ball a few years ago. Otherwise he had a normal birth and early development.  There is no history of febrile convulsions, CNS infections such as meningitis/encephalitis.  Prior AEDs: Depakote, Topamax, Vimpat  Diagnostic Data: MRI brain with and without contrast 07/27/2016: no acute changes seen. There were stable postsurgical changes in the posterior fossa related to resection of previous mass. 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 No recurrent mass or abnormal enhancement seen. There is a right transfrontal ventricular shunt seen terminating within the right ventricle. Hippocami symmetric.   PAST MEDICAL HISTORY: Past Medical History:  Diagnosis Date  . Asthma   . Cancer ([place])   . Chronic back pain   . Hydrocephalus   . Legally blind   . Migraine   . Seizure ([place])   . Seizures ([place])   . Vision loss     MEDICATIONS:  Outpatient Encounter Prescriptions as of 03/16/2017  Medication Sig  . clonazePAM (KLONOPIN) 0.5 MG tablet Take 1 tablet (0.5 mg total) by mouth 2 (two) times daily. (Patient not taking: Reported on 09/11/2016)  . diphenhydramine-acetaminophen (TYLENOL PM) 25-500 MG TABS tablet Take 2 tablets by mouth 3 (three) times daily as needed (pain).  Marland Kitchen levETIRAcetam (KEPPRA) 750 MG tablet Take 2 tablets (1,500 mg total) by mouth 2 (two) times daily.  . Multiple Vitamins-Minerals (MULTIVITAMIN ADULT PO) Take 1 tablet by mouth daily.  . ondansetron (ZOFRAN ODT) 4 MG disintegrating tablet Take 1 tablet (4 mg total) by mouth every 8 (eight) hours as needed for nausea or vomiting.  Marland Kitchen PROVENTIL HFA 108 (90 BASE) MCG/ACT inhaler Inhale 2 puffs into the lungs every 6 (six) hours as needed for shortness of breath.   . zonisamide (ZONEGRAN) 100 MG capsule Take 4 capsules at night   No facility-administered encounter medications on file as of 03/16/2017.     ALLERGIES: Allergies  Allergen  Reactions  . Shellfish Allergy Anaphylaxis    Cannot breathe   . Amoxicillin Itching    Has patient had a PCN reaction causing immediate rash, facial/tongue/throat swelling, SOB or lightheadedness with hypotension: No Has patient had a PCN reaction causing severe rash involving mucus membranes or skin necrosis: No Has patient had a PCN reaction that required hospitalization No Has patient had a PCN reaction occurring within the last 10 years: Yes If all of the above answers are "NO", then may proceed with Cephalosporin use.   . Vancomycin Itching    FAMILY HISTORY: Family History  Problem Relation Age of Onset  . Hypertension Mother   . Seizures Mother   . Cataracts Mother   . Stroke Mother   . Stroke Father   . Seizures Father   . Scoliosis Sister   . Vision loss Sister   . Vision loss Brother   . Diabetes Brother   . Leukemia Brother  SOCIAL HISTORY: Social History   Social History  . Marital status: Legally Separated    Spouse name: N/A  . Number of children: 2  . Years of education: GED   Occupational History  .  Industries [employer]   Social History Main Topics  . Smoking status: Never Smoker  . Smokeless tobacco: Never Used  . Alcohol use No  . Drug use: No  . Sexual activity: Not on file   Other Topics Concern  . Not on file   Social History Narrative   Patient lives at home with family.   Caffeine Use: 1-3 time weekly    REVIEW OF SYSTEMS: Constitutional: No fevers, chills, or sweats, no generalized fatigue, change in appetite Eyes: No visual changes, double vision, eye pain Ear, nose and throat: No hearing loss, ear pain, nasal congestion, sore throat Cardiovascular: No chest pain, palpitations Respiratory:  No shortness of breath at rest or with exertion, wheezes GastrointestinaI: No nausea, vomiting, diarrhea, abdominal pain, fecal incontinence Genitourinary:  No dysuria, urinary retention or frequency Musculoskeletal:  No neck pain, +back  pain Integumentary: No rash, pruritus, skin lesions Neurological: as above Psychiatric: No depression, insomnia, anxiety Endocrine: No palpitations, fatigue, diaphoresis, mood swings, change in appetite, change in weight, increased thirst Hematologic/Lymphatic:  No anemia, purpura, petechiae. Allergic/Immunologic: no itchy/runny eyes, nasal congestion, recent allergic reactions, rashes  PHYSICAL EXAM: Vitals:   03/16/17 1518  BP: 92/60  Pulse: (!) 51  SpO2: 98%   General: No acute distress Head:  Normocephalic/atraumatic Neck: supple, no paraspinal tenderness, full range of motion Back: No paraspinal tenderness Heart: regular rate and rhythm Lungs: Clear to auscultation bilaterally. Vascular: No carotid bruits. Skin/Extremities: No rash, no edema Neurological Exam: Mental status: alert and oriented to person, place, and time, no dysarthria or aphasia, Fund of knowledge is appropriate.  Recent and remote memory are intact. Attention and concentration are normal.    Able to name objects and repeat phrases. Cranial nerves: CN I: not tested CN II: pupils equal, briskly reactive on left, sluggish on right, visual acuity: hand movements on right, visual fields intact (unchanged) CN III, IV, VI:  full range of motion, no ptosis, nystagmoid eye movements to all directions of gaze (similar to prior) CN V: facial sensation intact CN VII: upper and lower face symmetric CN VIII: hearing intact to finger rub CN IX, X: gag intact, uvula midline CN XI: sternocleidomastoid and trapezius muscles intact CN XII: tongue midline Bulk & Tone: normal, no fasciculations. Motor: 5/5 throughout with no pronator drift. 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 EEG normal, MRI brain in 06/2016 showed no acute changes with postsurgical posterior fossa changes seen, right transfrontal shunt in place. Zonisamide was increased to 400mg  qhs after a breakthrough seizure in April, then he had 2 seizures on 02/21/17 due to inability to keep down medications from GI illness. Continue Zonisamide 400mg  qhs and Keppra 1500mg  BID. He is reporting an increase in headaches and will start a different headache preventative medication, nortriptyline, start low dose 10mg  qhs x 1 week, then increase to  20mg  qhs. We may uptitrate as tolerated, side effects were discussed. There may be component of medication overuse as well, he was advised to minimize rescue medication to 2-3 a week to avoid rebound headaches. He is reporting ?haloes around objects, with increased headaches, advised him to schedule an earlier eye appointment to assess for intraocular pressure. He is also reporting worsening balance and will be referred for balance therapy. He does not drive. He will follow-up in 6 months and knows to call for any changes.   Thank you for allowing me to participate in his care.  Please do not hesitate to call for any questions or concerns.  The duration of this appointment visit was 25 minutes of face-to-face time with the patient.  Greater than 50% of this time was spent in counseling, explanation of diagnosis, planning of further management, and coordination of care.   Ellouise Newer, M.D.   CC: Dr. Criss Rosales

## 2017-03-16 NOTE — Patient Instructions (Signed)
1. Start nortriptyline 10mg : take 1 capsule at night for a week, then increase to 2 capsules at night 2. Refer to PT for balance therapy 3. Call your eye doctor for an earlier appointment 4. Follow-up as scheduled with Dr. Christella Noa and let him know about balance as well 5. Continue Zonisamide and Keppra 6. Minimize over the counter pain medications to 2-3 a week, otherwise headaches can worsen 7. Follow-up in 6 months, call for any changes  Seizure Precautions: 1. If medication has been prescribed for you to prevent seizures, take it exactly as directed.  Do not stop taking the medicine without talking to your doctor first, even if you have not had a seizure in a long time.   2. Avoid activities in which a seizure would cause danger to yourself or to others.  Don't operate dangerous machinery, swim alone, or climb in high or dangerous places, such as on ladders, roofs, or girders.  Do not drive unless your doctor says you may.  3. If you have any warning that you may have a seizure, lay down in a safe place where you can't hurt yourself.    4.  No driving for 6 months from last seizure, as per [facility].   Please refer to the following link on the [place] website for more information: [email or web address]   5.  Maintain good sleep hygiene. Avoid alcohol.  6.  Contact your doctor if you have any problems that may be related to the medicine you are taking.  7.  Call 911 and bring the patient back to the ED if:        A.  The seizure lasts longer than 5 minutes.       B.  The patient doesn't awaken shortly after the seizure  C.  The patient has new problems such as difficulty seeing, speaking or moving  D.  The patient was injured during the seizure  E.  The patient has a temperature over 102 F (39C)  F.  The patient vomited and now is having trouble breathing

## 2017-04-14 ENCOUNTER — Other Ambulatory Visit: Payer: Self-pay | Admitting: Neurosurgery

## 2017-04-14 DIAGNOSIS — M5416 Radiculopathy, lumbar region: Secondary | ICD-10-CM

## 2017-04-17 ENCOUNTER — Ambulatory Visit
Admission: RE | Admit: 2017-04-17 | Discharge: 2017-04-17 | Disposition: A | Discharge status: Home / Self Care | Payer: Medicaid Other | Source: Ambulatory Visit | Attending: Neurosurgery | Admitting: Neurosurgery

## 2017-04-17 DIAGNOSIS — M5416 Radiculopathy, lumbar region: Secondary | ICD-10-CM

## 2017-08-06 ENCOUNTER — Other Ambulatory Visit: Payer: Self-pay | Admitting: Physical Medicine and Rehabilitation

## 2017-08-06 DIAGNOSIS — M542 Cervicalgia: Secondary | ICD-10-CM

## 2017-08-06 DIAGNOSIS — M5412 Radiculopathy, cervical region: Secondary | ICD-10-CM

## 2017-08-14 ENCOUNTER — Ambulatory Visit
Admission: RE | Admit: 2017-08-14 | Discharge: 2017-08-14 | Disposition: A | Discharge status: Home / Self Care | Payer: Medicaid Other | Source: Ambulatory Visit | Attending: Physical Medicine and Rehabilitation | Admitting: Physical Medicine and Rehabilitation

## 2017-08-14 DIAGNOSIS — M542 Cervicalgia: Secondary | ICD-10-CM

## 2017-08-14 DIAGNOSIS — M5412 Radiculopathy, cervical region: Secondary | ICD-10-CM

## 2017-09-14 ENCOUNTER — Ambulatory Visit (INDEPENDENT_AMBULATORY_CARE_PROVIDER_SITE_OTHER): Payer: Medicaid Other | Admitting: Neurology

## 2017-09-14 ENCOUNTER — Other Ambulatory Visit: Payer: Self-pay

## 2017-09-14 ENCOUNTER — Encounter: Payer: Self-pay | Admitting: Neurology

## 2017-09-14 VITALS — BP 112/82 | HR 62 | Ht 73.0 in | Wt 182.0 lb

## 2017-09-14 DIAGNOSIS — G43009 Migraine without aura, not intractable, without status migrainosus: Secondary | ICD-10-CM

## 2017-09-14 DIAGNOSIS — Z982 Presence of cerebrospinal fluid drainage device: Secondary | ICD-10-CM | POA: Diagnosis not present

## 2017-09-14 DIAGNOSIS — R634 Abnormal weight loss: Secondary | ICD-10-CM | POA: Diagnosis not present

## 2017-09-14 DIAGNOSIS — G40201 Localization-related (focal) (partial) symptomatic epilepsy and epileptic syndromes with complex partial seizures, not intractable, with status epilepticus: Secondary | ICD-10-CM

## 2017-09-14 MED ORDER — LEVETIRACETAM 500 MG PO TABS: ORAL_TABLET | ORAL | 3 refills | Status: DC

## 2017-09-14 MED ORDER — ZONISAMIDE 100 MG PO CAPS: ORAL_CAPSULE | ORAL | 3 refills | Status: DC

## 2017-09-14 MED ORDER — NORTRIPTYLINE HCL 10 MG PO CAPS: ORAL_CAPSULE | ORAL | 3 refills | Status: DC

## 2017-09-14 NOTE — Patient Instructions (Signed)
1. Continue Keppra 500mg : Take 3 tablets twice a day 2. Continue Zonisamide 100mg : take 4 caps at night 3. Continue nortriptyline 10mg : Take 2 caps at night 4. Refer to nutritionist for weight loss 5. Refer to [facility] for vision changes

## 2017-09-14 NOTE — Progress Notes (Signed)
NEUROLOGY FOLLOW UP OFFICE NOTE  Don Reed [id number]  DOB August 15, 1985  HISTORY OF PRESENT ILLNESS: I had the pleasure of seeing Don Reed in follow-up in the neurology clinic on 09/14/2017. The patient was last seen 6 months ago for seizures and headaches. He denies any seizures since August 2018. At that time he had a GI illness with inability to keep down medications. He is taking Zonisamide 400mg  qhs and Keppra 1500mg  BID without significant side effects except for weight loss possibly from Zonisamide. He states his eating habits have also changed a bit. On his last visit, he was reporting an increase in headaches occurring every other day. He was started on low dose nortriptyline. He was also instructed to start reducing Aleve intake. He reports that he is not having headaches regularly any longer, he had a few last week he feels were more weather-related. He does not take Aleve regularly now. He is concerned about his vision, he continues to see haloes and has headaches when he wears his glasses too long. He would like a second opinion from Don Reed LP.  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 He states that at age 79, he was having episodes of slurred speech, falls, recurrent syncope, and headaches, and was found to have the brain tumor. He recalls when he was younger, his head kept turning to one side and he would try to stop it. After the surgery, he did fairly well except for frequent headaches, then had his first seizure at age 33 or 89. He describes seizures as starting with back pain, then he gets a metallic taste in his mouth, his head starts hurting more, then he gets disoriented and has been told he would become unresponsive then proceed to generalized shaking, at times with urinary incontinence. He lives with his fiancee and brother, and denies any isolated staring  spells. He has been told he has seizures in his sleep. After a seizure, he denies any focal weakness, he can hear people around him but has difficulty getting his words out. He also has some nausea. He has fallen twice this year with his seizures, he was in the ER on 09/05/15 and 12/02/15 for seizure, during both ER visits, he would have a witnessed seizure in the ER. With the most recent seizure on 12/02/15, he had ran out of Vimpat. He has been taking Vimpat 100mg  BID since 2015, on Keppra 1500mg  BID since 2011. He recalls taking Depakote and Topamax in the past, with cognitive side effects. He continues to report memory issues, "I can't think too good." He feels his left side is different, with numbness in his left arm and leg. He had cervical fusion surgery and has been told this is the cause of his left-sided symptoms. His last shunt revision was in 1999. 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   He has a history of chronic headaches, with throbbing headaches occurring around twice a week over the frontal and temporal regions, with associated nausea, vomiting, photo/phonophobia. They would last for 1 to 1-1/2 days, he does not take any medication because he feels they have provoked seizures in the past. He reports taking an unrecalled headache preventative medication in [place] which was helpful. He denies any dizziness, bowel/bladder dysfunction, no myoclonic jerks. He has occasional twitching in an arm or leg. He has  chronic neck and back pain.   Epilepsy Risk Factors:  His father had generalized convulsions, his mother had seizures as well. He has a history of benign brain tumor s/p VP shunt, last revision in 1999. He reports several head injuries with loss of consciousness, most recently when hit in the face by a ball a few years ago. Otherwise he had a normal birth and early development.  There is no history of febrile convulsions, CNS infections such as meningitis/encephalitis.  Prior AEDs:  Depakote, Topamax, Vimpat  Diagnostic Data: MRI brain with and without contrast 07/27/2016: no acute changes seen. There were stable postsurgical changes in the posterior fossa related to resection of previous mass. 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 No recurrent mass or abnormal enhancement seen. There is a right transfrontal ventricular shunt seen terminating within the right ventricle. Hippocami symmetric.   PAST MEDICAL HISTORY: Past Medical History:  Diagnosis Date  . Asthma   . Cancer ([place])   . Chronic back pain   . Hydrocephalus   . Legally blind   . Migraine   . Seizure ([place])   . Seizures ([place])   . Vision loss     MEDICATIONS:  Outpatient Encounter Medications as of 09/14/2017  Medication Sig  . clonazePAM (KLONOPIN) 0.5 MG tablet Take 1 tablet (0.5 mg total) by mouth 2 (two) times daily.  . diphenhydramine-acetaminophen (TYLENOL PM) 25-500 MG TABS tablet Take 2 tablets by mouth 3 (three) times daily as needed (pain).  Marland Kitchen levETIRAcetam (KEPPRA) 750 MG tablet Take 2 tablets (1,500 mg total) by mouth 2 (two) times daily.  . Multiple Vitamins-Minerals (MULTIVITAMIN ADULT PO) Take 1 tablet by mouth daily.  . nortriptyline (PAMELOR) 10 MG capsule Take 1 capsule at night for a week, then increase to 2 capsules at night  . ondansetron (ZOFRAN ODT) 4 MG disintegrating tablet Take 1 tablet (4 mg total) by mouth every 8 (eight) hours as needed for nausea or vomiting.  Marland Kitchen PROVENTIL HFA 108 (90 BASE) MCG/ACT inhaler Inhale 2 puffs into the lungs every 6 (six) hours as needed for shortness of breath.   . zonisamide (ZONEGRAN) 100 MG capsule Take 4 capsules at night   No facility-administered encounter medications on file as of 09/14/2017.     ALLERGIES: Allergies  Allergen Reactions  . Shellfish Allergy Anaphylaxis    Cannot breathe   . Amoxicillin Itching    Has patient had a PCN reaction causing  immediate rash, facial/tongue/throat swelling, SOB or lightheadedness with hypotension: No Has patient had a PCN reaction causing severe rash involving mucus membranes or skin necrosis: No Has patient had a PCN reaction that required hospitalization No Has patient had a PCN reaction occurring within the last 10 years: Yes If all of the above answers are "NO", then may proceed with Cephalosporin use.   . Vancomycin Itching    FAMILY HISTORY: Family History  Problem Relation Age of Onset  . Hypertension Mother   . Seizures Mother   . Cataracts Mother   . Stroke Mother   . Stroke Father   . Seizures Father   . Scoliosis Sister   . Vision loss Sister   . Vision loss Brother   . Diabetes Brother   . Leukemia Brother     SOCIAL HISTORY: Social History   Socioeconomic History  . Marital status: Legally Separated    Spouse name: Not on file  . Number of children: 2  .  Years of education: GED  . Highest education level: Not on file  Social Needs  . Financial resource strain: Not on file  . Food insecurity - worry: Not on file  . Food insecurity - inability: Not on file  . Transportation needs - medical: Not on file  . Transportation needs - non-medical: Not on file  Occupational History    Employer: [employer]  Tobacco Use  . Smoking status: Never Smoker  . Smokeless tobacco: Never Used  Substance and Sexual Activity  . Alcohol use: No  . Drug use: No  . Sexual activity: Not on file  Other Topics Concern  . Not on file  Social History Narrative   Patient lives at home with family.   Caffeine Use: 1-3 time weekly    REVIEW OF SYSTEMS: Constitutional: No fevers, chills, or sweats, no generalized fatigue, change in appetite Eyes: No visual changes, double vision, eye pain Ear, nose and throat: No hearing loss, ear pain, nasal congestion, sore throat Cardiovascular: No chest pain, palpitations Respiratory:  No shortness of breath at rest or with exertion,  wheezes GastrointestinaI: No nausea, vomiting, diarrhea, abdominal pain, fecal incontinence Genitourinary:  No dysuria, urinary retention or frequency Musculoskeletal:  No neck pain, +back pain Integumentary: No rash, pruritus, skin lesions Neurological: as above Psychiatric: No depression, insomnia, anxiety Endocrine: No palpitations, fatigue, diaphoresis, mood swings, change in appetite, change in weight, increased thirst Hematologic/Lymphatic:  No anemia, purpura, petechiae. Allergic/Immunologic: no itchy/runny eyes, nasal congestion, recent allergic reactions, rashes  PHYSICAL EXAM: Vitals:   09/14/17 1541  BP: 112/82  Pulse: 62  SpO2: 100%   General: No acute distress Head:  Normocephalic/atraumatic Neck: supple, no paraspinal tenderness, full range of motion Back: No paraspinal tenderness Heart: regular rate and rhythm Lungs: Clear to auscultation bilaterally. Vascular: No carotid bruits. Skin/Extremities: No rash, no edema Neurological Exam: Mental status: alert and oriented to person, place, and time, no dysarthria or aphasia, Fund of knowledge is appropriate.  Recent and remote memory are intact. Attention and concentration are normal.    Able to name objects and repeat phrases. Cranial nerves: CN I: not tested CN II: pupils equal, briskly reactive on left, sluggish on right, visual acuity: hand movements on right, visual fields intact (unchanged) CN III, IV, VI:  full range of motion, no ptosis, nystagmoid eye movements to all directions of gaze (similar to prior) CN V: facial sensation intact CN VII: upper and lower face symmetric CN VIII: hearing intact to finger rub CN IX, X: gag intact, uvula midline CN XI: sternocleidomastoid and trapezius muscles intact CN XII: tongue midline Bulk & Tone: normal, no fasciculations. Motor: 5/5 throughout with no pronator drift. 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 EEG normal, MRI brain in 06/2016 showed no acute changes with postsurgical posterior fossa changes seen, right transfrontal shunt in place. Zonisamide was increased to 400mg  qhs after a breakthrough seizure in April 2018, then he had 2 seizures on 02/21/17 due to inability to keep down medications from GI illness. No further seizures since then. He has been  having weight loss, possibly related to Zonisamide, however since he has been doing so well with seizures, we discussed staying on same dose and changing diet first. Continue Zonisamide 400mg  qhs and Keppra 1500mg  BID. He would like a referral to Nutrition to help with dietary recommendations. Headaches are much improved with low dose nortriptyline 20mg  qhs, refills sent. He continues to report vision changes and has been told by his ophthalmologist this is due to "central cause," likely due to underlying brain disorder, he would like a second opinion from [facility]. He does not drive. He will follow-up in 6 months and knows to call for any changes.   Thank you for allowing me to participate in his care.  Please do not hesitate to call for any questions or concerns.  The duration of this appointment visit was 25 minutes of face-to-face time with the patient.  Greater than 50% of this time was spent in counseling, explanation of diagnosis, planning of further management, and coordination of care.   Ellouise Newer, M.D.   CC: Dr. Criss Rosales

## 2017-10-11 ENCOUNTER — Ambulatory Visit: Payer: Medicaid Other | Admitting: Registered"

## 2017-10-13 ENCOUNTER — Ambulatory Visit: Payer: Medicaid Other | Admitting: Registered"

## 2017-11-02 ENCOUNTER — Encounter: Payer: Medicaid Other | Attending: Family Medicine | Admitting: Registered"

## 2017-11-02 ENCOUNTER — Encounter: Payer: Self-pay | Admitting: Registered"

## 2017-11-02 DIAGNOSIS — Z713 Dietary counseling and surveillance: Secondary | ICD-10-CM | POA: Insufficient documentation

## 2017-11-02 DIAGNOSIS — R634 Abnormal weight loss: Secondary | ICD-10-CM | POA: Insufficient documentation

## 2017-11-02 NOTE — Patient Instructions (Addendum)
Consider cutting back on weight lifting to 2-3 x week, not more than 60 min  Foods to include in your next shopping trip: Nuts Avocado Variety of meats including Tuna canned in oil Whole milk and other whole milk dairy products Heavy cream for mashed potatoes and other cooking such as oatmeal. Whole fat cream cheese for bagels Nutritional supplement drinks such as Boost of Ensure with at least 200 calories (for snacks in between meals)

## 2017-11-02 NOTE — Progress Notes (Signed)
Medical Nutrition Therapy:  Appt start time: 1120 end time:  1210.  Assessment:  Primary concerns today: patient states he is concerned about recently weight loss and states he was afraid it was something serious like cancer. Patient states his neurologist thought the weight loss might be due to seizure medication zonisamide.   Patient states for the last 6 weeks he as been back at the gym 4x week, 60-90 minutes weight training. Pt states he does not do cardio because he does not want to lose anymore weight.   Sleep: 6-7 hrs sleep, patient states he feels rested.  Preferred Learning Style: (legally blind, but has enough vision that he has equipment at home he can use to help see written material)  No preference indicated   Learning Readiness:   Ready  MEDICATIONS: reviewed   DIETARY INTAKE:  Avoided foods include soda.    24-hr recall:  B ( AM): cereal OR eggs, [place] sausage or bacon, hashbrowns, juice OR water Snk ( AM): none  L ( PM): hotdog OR sandwich OR ramen noodles Snk ( PM): none OR nabs OR fruit OR PB&J sandwich D ( PM): chicken, corn or potatoes, rice  Snk ( PM): none OR poptart OR sherbert Beverages: water, juice  Usual physical activity: weight lifting, 4x week, 60-90 min  Estimated energy needs: 2400 calories 270 g carbohydrates 150 g protein 80 g fat  Progress Towards Goal(s):  In progress.   Nutritional Diagnosis:  [place]-3.2 Unintentional weight loss As related to inadequate intake to compensate for seizure medication side effect of weight loss.  As evidenced by dietary recall and MD evaluation of weight loss cause.    Intervention:  Nutrition Education. Discussed sources of higher calorie foods and strategies including cooking tips to increase intake. Discussed importance of including snacks regularly. Discussed need to reduce energy expenditure until adequate nutrition is evident by increased weight.  Plan: Consider cutting back on weight lifting to 2-3  x week, not more than 60 min  Foods to include in your next shopping trip: Nuts Avocado Variety of meats including Tuna canned in oil Whole milk and other whole milk dairy products Heavy cream for mashed potatoes and other cooking such as oatmeal. Whole fat cream cheese for bagels Nutritional supplement drinks such as Boost of Ensure with at least 200 calories (for snacks in between meals)  Teaching Method Utilized:  Visual (large print handouts) Auditory  Handouts given during visit include:  AND High Calorie, High Protein Nutrition Therapy tips and menu  Barriers to learning/adherence to lifestyle change: none  Demonstrated degree of understanding via:  Teach Back   Monitoring/Evaluation:  Dietary intake, exercise, and body weight in 4 week(s).

## 2017-11-26 ENCOUNTER — Telehealth: Payer: Self-pay | Admitting: Neurology

## 2017-11-26 NOTE — Telephone Encounter (Signed)
Patient wants a referral to be sent to [facility] please fax it to [phone] Patient states that he called [facility] and they have not gotten anything from [place] and he would like this sent today

## 2017-11-26 NOTE — Telephone Encounter (Signed)
Referral faxed to number below.

## 2017-11-26 NOTE — Telephone Encounter (Signed)
Sorry will send to [facility]

## 2017-12-07 ENCOUNTER — Ambulatory Visit: Payer: Medicaid Other | Admitting: Registered"

## 2017-12-31 ENCOUNTER — Telehealth: Payer: Self-pay | Admitting: Neurology

## 2017-12-31 NOTE — Telephone Encounter (Signed)
Mel Almond from [facility] calling in regards to a referral sent over for this pt. Mel Almond states that medicaid transportation wants pt seen with in the county they [place] in. Mel Almond requesting a call back at [phone] to discuss further.

## 2017-12-31 NOTE — Telephone Encounter (Signed)
Pls let him know Medicaid issues, if he would like a second opinion locally as [facility] is saying, or if he can get transportation to [facility] and referral to Neuro-ophthalmology will be sent. Thanks

## 2017-12-31 NOTE — Telephone Encounter (Signed)
Spoke with Mel Almond.  She states that pt was scheduled with an optometrist, not a ophthalmologist.  Also states that medicaid usually does not transport outside of pt's county.  States that her Dr.s cannot sign off on transportation being a necessity.

## 2017-12-31 NOTE — Telephone Encounter (Signed)
Don Reed calling back stating if the referring provider still wants pt seen here and pt can get transportation to call her and they will schedule with an ophthalmologist.

## 2018-01-17 ENCOUNTER — Encounter: Payer: Medicaid Other | Attending: Family Medicine | Admitting: Registered"

## 2018-01-17 DIAGNOSIS — R634 Abnormal weight loss: Secondary | ICD-10-CM | POA: Diagnosis not present

## 2018-01-17 DIAGNOSIS — Z713 Dietary counseling and surveillance: Secondary | ICD-10-CM | POA: Insufficient documentation

## 2018-01-17 NOTE — Progress Notes (Signed)
Medical Nutrition Therapy:  Appt start time: 1150 end time:  1220.  Assessment:  Primary concerns today: Pt has gained 1 lb since last visit. Patient states he receives food stamps and has a child with medical needs so cannot afford to always eat the foods we discussed last visit. Pt states he couldn't remember which Boost to get so he just got the regular. Pt states he is eating tuna.  Pt states sometimes he has to push himself emotionally to get out of bed, but feels he gets enough motivation to push through depression. Pt states he talks to his pastor.   Boost Plus Lot# [id number] Exp: 10/01/18  Thrive Ice cream, Chocolate  Preferred Learning Style: (legally blind, but has enough vision that he has equipment at home he can use to help see written material)   No preference indicated   Learning Readiness:   Ready  MEDICATIONS: reviewed   DIETARY INTAKE:  Avoided foods include soda.    24-hr recall:  B ( AM): hot pocket OR 2 sausage, hashbrown Snk ( AM): peanuts  L ( PM): cheeseburger OR boost Snk ( PM): none D ( PM): broccoli, beef, rice Snk ( PM): peanuts Beverages: water, juice  Usual physical activity: weight lifting, 4x week, 60-90 min  Estimated energy needs: 2400 calories 270 g carbohydrates 150 g protein 80 g fat  Progress Towards Goal(s):  In progress.   Nutritional Diagnosis:  [place]-3.2 Unintentional weight loss As related to inadequate intake to compensate for seizure medication side effect of weight loss.  As evidenced by dietary recall and MD evaluation of weight loss cause.    Intervention:  Nutrition Education. Discussed how to include higher calorie foods that costs less. Reviewed the need to reduce energy expenditure until adequate nutrition is evident by increased weight.  Plan: Therapy resources: https://[facility]rd-[facility].org/ Ask your insurance company if they would cover nutritional supplements such as Boost. Ask at [facility] if  they know of a community program that would help provide it.  Consider getting the old fashioned oats because they are inexpensive. A convenient food idea: Frozen waffles with PB Limit going to the gym maybe limit to 45 min 3 times a week. After you start gaining more weight you can try adding more time/days if you want to.  Teaching Method Utilized:  Visual (large print handouts) Auditory  Handouts given during visit include:  none  Barriers to learning/adherence to lifestyle change: none  Demonstrated degree of understanding via:  Teach Back   Monitoring/Evaluation:  Dietary intake, exercise, and body weight in 4 week(s).

## 2018-01-17 NOTE — Patient Instructions (Addendum)
Therapy resources: https://byrd-solis.org/ Ask your insurance company if they would cover nutritional supplements such as Boost. Ask at [facility] if they know of a community program that would help provide it.  Consider getting the old fashioned oats because they are inexpensive. Frozen waffles with PB Limit going to the gym maybe limit to 45 min 3 times a week. After you start gaining more weight you can try adding more time/days if you want to.

## 2018-01-31 ENCOUNTER — Emergency Department (HOSPITAL_COMMUNITY)
Admission: EM | Admit: 2018-01-31 | Discharge: 2018-01-31 | Disposition: A | Discharge status: Home / Self Care | Payer: Medicaid Other | Attending: Emergency Medicine | Admitting: Emergency Medicine

## 2018-01-31 ENCOUNTER — Other Ambulatory Visit: Payer: Self-pay

## 2018-01-31 ENCOUNTER — Encounter (HOSPITAL_COMMUNITY): Payer: Self-pay

## 2018-01-31 DIAGNOSIS — R109 Unspecified abdominal pain: Secondary | ICD-10-CM | POA: Diagnosis not present

## 2018-01-31 DIAGNOSIS — R634 Abnormal weight loss: Secondary | ICD-10-CM | POA: Insufficient documentation

## 2018-01-31 DIAGNOSIS — Z5321 Procedure and treatment not carried out due to patient leaving prior to being seen by health care provider: Secondary | ICD-10-CM | POA: Insufficient documentation

## 2018-01-31 LAB — CBC WITH DIFFERENTIAL/PLATELET
Abs Immature Granulocytes: 0 10*3/uL (ref 0.0–0.1)
BASOS ABS: 0.1 10*3/uL (ref 0.0–0.1)
Basophils Relative: 1 %
EOS PCT: 2 %
Eosinophils Absolute: 0.2 10*3/uL (ref 0.0–0.7)
HEMATOCRIT: 42.1 % (ref 39.0–52.0)
Hemoglobin: 13.4 g/dL (ref 13.0–17.0)
IMMATURE GRANULOCYTES: 0 %
LYMPHS ABS: 2.5 10*3/uL (ref 0.7–4.0)
LYMPHS PCT: 31 %
MCH: 26.4 pg (ref 26.0–34.0)
MCHC: 31.8 g/dL (ref 30.0–36.0)
MCV: 82.9 fL (ref 78.0–100.0)
MONO ABS: 0.7 10*3/uL (ref 0.1–1.0)
MONOS PCT: 8 %
NEUTROS ABS: 4.7 10*3/uL (ref 1.7–7.7)
Neutrophils Relative %: 58 %
PLATELETS: 222 10*3/uL (ref 150–400)
RBC: 5.08 MIL/uL (ref 4.22–5.81)
RDW: 13.4 % (ref 11.5–15.5)
WBC: 8.2 10*3/uL (ref 4.0–10.5)

## 2018-01-31 LAB — COMPREHENSIVE METABOLIC PANEL
ALBUMIN: 4.5 g/dL (ref 3.5–5.0)
ALT: 14 U/L (ref 0–44)
ANION GAP: 9 (ref 5–15)
AST: 20 U/L (ref 15–41)
Alkaline Phosphatase: 40 U/L (ref 38–126)
BILIRUBIN TOTAL: 0.8 mg/dL (ref 0.3–1.2)
BUN: 16 mg/dL (ref 6–20)
CO2: 24 mmol/L (ref 22–32)
Calcium: 9.7 mg/dL (ref 8.9–10.3)
Chloride: 106 mmol/L (ref 98–111)
Creatinine, Ser: 1.75 mg/dL — ABNORMAL HIGH (ref 0.61–1.24)
GFR calc Af Amer: 57 mL/min — ABNORMAL LOW (ref >60)
GFR calc non Af Amer: 50 mL/min — ABNORMAL LOW (ref >60)
GLUCOSE: 92 mg/dL (ref 70–99)
POTASSIUM: 4 mmol/L (ref 3.5–5.1)
SODIUM: 139 mmol/L (ref 135–145)
TOTAL PROTEIN: 7.9 g/dL (ref 6.5–8.1)

## 2018-01-31 NOTE — ED Notes (Signed)
This Rn called pt to see if pt was coming back, pt did not answer. Left post triage.

## 2018-01-31 NOTE — ED Notes (Signed)
Called for pt x 3 with no response. Per Lobby techs pt stepped away to get medications for his daughter and will be back. Pt moved off the floor until he arrives back.

## 2018-01-31 NOTE — ED Notes (Signed)
Pt did not respond when called for vitals in [facility] 

## 2018-01-31 NOTE — ED Triage Notes (Addendum)
Pt endorses weight loss/loss of appetite over last several months with some occasional abd pain. Pt has epilepsy and on 1 new seizures med which is when sx began, went to pcp and was told that the zanisimide has weight loss side effect. Pt has been going to a nutritionist, tried to go to pcp today but could not get an appointment. Pt states "every time I eat I don't feel like I get full and my butt is itching, I don't know what's going on" VSS

## 2018-01-31 NOTE — ED Notes (Signed)
Called to recheck vitals. No answer. Staff has called several times for this pt. No answer

## 2018-02-01 NOTE — ED Notes (Signed)
Follow up call made  No answer  02/01/18  1054  s Babbie Dondlinger rn

## 2018-02-14 ENCOUNTER — Ambulatory Visit: Payer: Medicaid Other | Admitting: Registered"

## 2018-02-16 ENCOUNTER — Telehealth: Payer: Self-pay

## 2018-02-16 NOTE — Telephone Encounter (Signed)
Received call from [facility] with [facility].  She is stating that pt's transportation has been denied.  Per her records as well as ours, there is no medical necessity that pt needs to be seen at [facility].  Ebony Hail suggests that pt be seen with general ophthalmologist locally for 2nd opinion, and if determined that he needs to see Neurophthalmology referral can be placed with them and it will then be considered a medical necessity.

## 2018-03-15 ENCOUNTER — Other Ambulatory Visit: Payer: Self-pay

## 2018-03-15 ENCOUNTER — Encounter: Payer: Self-pay | Admitting: Neurology

## 2018-03-15 ENCOUNTER — Ambulatory Visit (INDEPENDENT_AMBULATORY_CARE_PROVIDER_SITE_OTHER): Payer: Medicaid Other | Admitting: Neurology

## 2018-03-15 VITALS — BP 110/68 | HR 76 | Ht 73.0 in | Wt 191.0 lb

## 2018-03-15 DIAGNOSIS — G40201 Localization-related (focal) (partial) symptomatic epilepsy and epileptic syndromes with complex partial seizures, not intractable, with status epilepticus: Secondary | ICD-10-CM | POA: Diagnosis not present

## 2018-03-15 DIAGNOSIS — G43009 Migraine without aura, not intractable, without status migrainosus: Secondary | ICD-10-CM

## 2018-03-15 DIAGNOSIS — Z982 Presence of cerebrospinal fluid drainage device: Secondary | ICD-10-CM | POA: Diagnosis not present

## 2018-03-15 MED ORDER — LEVETIRACETAM 500 MG PO TABS: ORAL_TABLET | ORAL | 3 refills | Status: DC

## 2018-03-15 MED ORDER — NORTRIPTYLINE HCL 50 MG PO CAPS: 50.0000 mg | ORAL_CAPSULE | Freq: Every day | ORAL | 3 refills | Status: DC

## 2018-03-15 MED ORDER — ZONISAMIDE 100 MG PO CAPS: ORAL_CAPSULE | ORAL | 3 refills | Status: DC

## 2018-03-15 NOTE — Progress Notes (Signed)
NEUROLOGY FOLLOW UP OFFICE NOTE  Don Reed [id number]  DOB 1985/09/03  HISTORY OF PRESENT ILLNESS: I had the pleasure of seeing Don Reed in follow-up in the neurology clinic on 03/15/2018. The patient was last seen 6 months ago for seizures and headaches. He continues to do well from a seizure standpoint with no seizures since August 2018. He is taking Zonisamide 400mg  qhs and Keppra 1500mg  BID without significant side effects. He was reporting weight loss and saw Nutrition, and is happy with weight gain. His main concern today are worsening headaches. He had good response to nortriptyline 20mg  qhs in the past, but over the past month or so, he has had headaches on a daily basis. He sometimes feels dizzy where he cannot walk well. No falls. He feels this may relate to worsening back and right leg pain. He takes Tylenol but not daily. He has seen [facility] and had an EMG of the right UE and an MRI of the cervical and lumbar spine. MRI cervical spine in 07/2017 showed moderate stenosis at C3-4, moderate foraminal encroachment right greater than left at C3-4, no change from prior MRI. There eis moderate right foraminal narrowing at C4-5 and mild left foraminal narrowing at C4-5, unchanged. 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 He states that at age 4, he was having episodes of slurred speech, falls, recurrent syncope, and headaches, and was found to have the brain tumor. He recalls when he was younger, his head kept turning to one side and he would try to stop it. After the surgery, he did fairly well except for frequent headaches, then had his first seizure at age 52 or 27. He describes seizures as starting with  back pain, then he gets a metallic taste in his mouth, his head starts hurting more, then he gets disoriented and has been told he would become unresponsive then proceed to generalized shaking, at times with urinary incontinence. He lives with his fiancee and brother, and denies any isolated staring spells. He has been told he has seizures in his sleep. After a seizure, he denies any focal weakness, he can hear people around him but has difficulty getting his words out. He also has some nausea. He has fallen twice this year with his seizures, he was in the ER on 09/05/15 and 12/02/15 for seizure, during both ER visits, he would have a witnessed seizure in the ER. With the most recent seizure on 12/02/15, he had ran out of Vimpat. He has been taking Vimpat 100mg  BID since 2015, on Keppra 1500mg  BID since 2011. He recalls taking Depakote and Topamax in the past, with cognitive side effects. He continues to report memory issues, "I can't think too good." He feels his left side is different, with numbness in his left arm and leg. He had cervical fusion surgery and has been told this is the cause of his left-sided symptoms. His last shunt revision was in 1999. 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   He has a history of chronic headaches, with throbbing headaches occurring around twice a week over the frontal and temporal regions, with associated nausea, vomiting, photo/phonophobia. They would last for 1 to  1-1/2 days, he does not take any medication because he feels they have provoked seizures in the past. He reports taking an unrecalled headache preventative medication in [place] which was helpful. He denies any dizziness, bowel/bladder dysfunction, no myoclonic jerks. He has occasional twitching in an arm or leg. He has chronic neck and back pain.   Epilepsy Risk Factors:  His father had generalized convulsions, his mother had seizures as well. He has a history of benign brain tumor s/p VP shunt, last  revision in 1999. He reports several head injuries with loss of consciousness, most recently when hit in the face by a ball a few years ago. Otherwise he had a normal birth and early development.  There is no history of febrile convulsions, CNS infections such as meningitis/encephalitis.  Prior AEDs: Depakote, Topamax, Vimpat  Diagnostic Data: MRI brain with and without contrast 07/27/2016: no acute changes seen. There were stable postsurgical changes in the posterior fossa related to resection of previous mass. 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 No recurrent mass or abnormal enhancement seen. There is a right transfrontal ventricular shunt seen terminating within the right ventricle. Hippocami symmetric.   PAST MEDICAL HISTORY: Past Medical History:  Diagnosis Date  . Asthma   . Cancer ([place])   . Chronic back pain   . Hydrocephalus   . Legally blind   . Migraine   . Seizure ([place])   . Seizures ([place])   . Vision loss     MEDICATIONS:  Outpatient Encounter Medications as of 03/15/2018  Medication Sig  . CREATINE PO Take by mouth.  . diphenhydramine-acetaminophen (TYLENOL PM) 25-500 MG TABS tablet Take 2 tablets by mouth 3 (three) times daily as needed (pain).  Marland Kitchen levETIRAcetam (KEPPRA) 500 MG tablet Take 3 tabs twice a day  . Multiple Vitamins-Minerals (MULTIVITAMIN ADULT PO) Take 1 tablet by mouth daily.  . nortriptyline (PAMELOR) 10 MG capsule Take 2 capsules at night  . PROVENTIL HFA 108 (90 BASE) MCG/ACT inhaler Inhale 2 puffs into the lungs every 6 (six) hours as needed for shortness of breath.   . valACYclovir (VALTREX) 500 MG tablet TK 1 T PO BID  . zonisamide (ZONEGRAN) 100 MG capsule Take 4 capsules at night  . ondansetron (ZOFRAN ODT) 4 MG disintegrating tablet Take 1 tablet (4 mg total) by mouth every 8 (eight) hours as needed for nausea or vomiting. (Patient not taking: Reported on 03/15/2018)    No facility-administered encounter medications on file as of 03/15/2018.     ALLERGIES: Allergies  Allergen Reactions  . Shellfish Allergy Anaphylaxis    Cannot breathe   . Amoxicillin Itching    Has patient had a PCN reaction causing immediate rash, facial/tongue/throat swelling, SOB or lightheadedness with hypotension: No Has patient had a PCN reaction causing severe rash involving mucus membranes or skin necrosis: No Has patient had a PCN reaction that required hospitalization No Has patient had a PCN reaction occurring within the last 10 years: Yes If all of the above answers are "NO", then may proceed with Cephalosporin use.   . Vancomycin Itching    FAMILY HISTORY: Family History  Problem Relation Age of Onset  . Hypertension Mother   . Seizures Mother   . Cataracts Mother   . Stroke Mother   . Stroke Father   . Seizures Father   . Scoliosis Sister   . Vision loss Sister   . Vision loss Brother   .  Diabetes Brother   . Leukemia Brother     SOCIAL HISTORY: Social History   Socioeconomic History  . Marital status: Legally Separated    Spouse name: Not on file  . Number of children: 2  . Years of education: GED  . Highest education level: Not on file  Occupational History    Employer: [employer]  Social Needs  . Financial resource strain: Not on file  . Food insecurity:    Worry: Not on file    Inability: Not on file  . Transportation needs:    Medical: Not on file    Non-medical: Not on file  Tobacco Use  . Smoking status: Never Smoker  . Smokeless tobacco: Never Used  Substance and Sexual Activity  . Alcohol use: No  . Drug use: No  . Sexual activity: Not on file  Lifestyle  . Physical activity:    Days per week: Not on file    Minutes per session: Not on file  . Stress: Not on file  Relationships  . Social connections:    Talks on phone: Not on file    Gets together: Not on file    Attends religious service: Not on file    Active  member of club or organization: Not on file    Attends meetings of clubs or organizations: Not on file    Relationship status: Not on file  . Intimate partner violence:    Fear of current or ex partner: Not on file    Emotionally abused: Not on file    Physically abused: Not on file    Forced sexual activity: Not on file  Other Topics Concern  . Not on file  Social History Narrative   Patient lives at home with family.   Caffeine Use: 1-3 time weekly    REVIEW OF SYSTEMS: Constitutional: No fevers, chills, or sweats, no generalized fatigue, change in appetite Eyes: No visual changes, double vision, eye pain Ear, nose and throat: No hearing loss, ear pain, nasal congestion, sore throat Cardiovascular: No chest pain, palpitations Respiratory:  No shortness of breath at rest or with exertion, wheezes GastrointestinaI: No nausea, vomiting, diarrhea, abdominal pain, fecal incontinence Genitourinary:  No dysuria, urinary retention or frequency Musculoskeletal:  No neck pain, +back pain Integumentary: No rash, pruritus, skin lesions Neurological: as above Psychiatric: No depression, insomnia, anxiety Endocrine: No palpitations, fatigue, diaphoresis, mood swings, change in appetite, change in weight, increased thirst Hematologic/Lymphatic:  No anemia, purpura, petechiae. Allergic/Immunologic: no itchy/runny eyes, nasal congestion, recent allergic reactions, rashes  PHYSICAL EXAM: Vitals:   03/15/18 1106  BP: 110/68  Pulse: 76  SpO2: 99%   General: No acute distress Head:  Normocephalic/atraumatic Neck: supple, no paraspinal tenderness, full range of motion Back: No paraspinal tenderness Heart: regular rate and rhythm Lungs: Clear to auscultation bilaterally. Vascular: No carotid bruits. Skin/Extremities: No rash, no edema Neurological Exam: Mental status: alert and oriented to person, place, and time, no dysarthria or aphasia, Fund of knowledge is appropriate.  Recent and  remote memory are intact. Attention and concentration are normal.    Able to name objects and repeat phrases. Cranial nerves: CN I: not tested CN II: pupils equal, briskly reactive on left, sluggish on right (similar to prior), visual acuity: hand movements on right, visual fields intact (similar to prior) CN III, IV, VI:  full range of motion, no ptosis, nystagmoid eye movements to all directions of gaze (similar to prior) CN V: facial sensation intact CN VII:  upper and lower face symmetric CN VIII: hearing intact to finger rub CN IX, X: gag intact, uvula midline CN XI: sternocleidomastoid and trapezius muscles intact CN XII: tongue midline Bulk & Tone: normal, no fasciculations. Motor: 5/5 throughout with no pronator drift. 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 EEG normal, MRI brain in 06/2016 showed no acute changes with postsurgical posterior fossa changes seen, right transfrontal shunt in place. He denies any seizures since April 2018 on Zonisamide 400mg  qhs and Keppra 1500mg  BID with no side effects. His main concern today is recurrence of headaches, he had prior good response to nortriptyline, increase to 30mg  qhs for a week, then 50mg  qhs. Side effects discussed. He continues to report vision changes and haloes in his vision, and has been told by his ophthalmologist this is due to "central cause," likely due to underlying brain disorder, he would like a second opinion. Continue Ortho follow-up for back/right leg pain. He does not drive. He will follow-up  in 6 months and knows to call for any changes.   Thank you for allowing me to participate in his care.  Please do not hesitate to call for any questions or concerns.  The duration of this appointment visit was 30 minutes of face-to-face time with the patient.  Greater than 50% of this time was spent in counseling, explanation of diagnosis, planning of further management, and coordination of care.   Don Reed, M.D.   CC: Dr. Criss Rosales

## 2018-03-15 NOTE — Patient Instructions (Addendum)
1. Increase nortriptyline, with your current bottle of nortriptyline 10mg , take 3 capsules every night. Once done, your new bottle will be for nortriptyline 50mg , take 1 capsule every night  2. Continue Zonisamide 400mg  every night and Levetiracetam 500mg  3 tabs twice a day  3. Refer to [facility] for vision changes, haloes in vision  4. Follow-up in 6 months, call for any changes  Seizure Precautions: 1. If medication has been prescribed for you to prevent seizures, take it exactly as directed.  Do not stop taking the medicine without talking to your doctor first, even if you have not had a seizure in a long time.   2. Avoid activities in which a seizure would cause danger to yourself or to others.  Don't operate dangerous machinery, swim alone, or climb in high or dangerous places, such as on ladders, roofs, or girders.  Do not drive unless your doctor says you may.  3. If you have any warning that you may have a seizure, lay down in a safe place where you can't hurt yourself.    4.  No driving for 6 months from last seizure, as per [facility].   Please refer to the following link on the [place] website for more information: http://[email or web address]/[email or web address]/[email or web address]   5.  Maintain good sleep hygiene. Avoid alcohol.  6.  Contact your doctor if you have any problems that may be related to the medicine you are taking.  7.  Call 911 and bring the patient back to the ED if:        A.  The seizure lasts longer than 5 minutes.       B.  The patient doesn't awaken shortly after the seizure  C.  The patient has new problems such as difficulty seeing, speaking or moving  D.  The patient was injured during the seizure  E.  The patient has a temperature over 102 F (39C)  F.  The patient vomited and now is having trouble breathing

## 2018-04-05 ENCOUNTER — Ambulatory Visit: Payer: Medicaid Other | Admitting: Registered"

## 2018-04-12 ENCOUNTER — Encounter: Payer: Medicaid Other | Attending: Family Medicine | Admitting: Registered"

## 2018-04-12 VITALS — Ht 73.0 in | Wt 188.0 lb

## 2018-04-12 DIAGNOSIS — Z713 Dietary counseling and surveillance: Secondary | ICD-10-CM | POA: Diagnosis not present

## 2018-04-12 DIAGNOSIS — R634 Abnormal weight loss: Secondary | ICD-10-CM | POA: Insufficient documentation

## 2018-04-12 NOTE — Progress Notes (Signed)
Medical Nutrition Therapy:  Appt start time: 1515 end time:  1535.  Follow-up Assessment:  Primary concerns today: Pt returns for assessment/guidance to help gain weight. Pt states he feels good that he has gained 5 lbs since he first started coming in. Patient has reduce days at the gym to 3 instead of 4x week.  Pt states sometimes he has to push himself emotionally to get out of bed, but feels he gets enough motivation to push through depression. Pt states he talks to his pastor.   Patient states that sometimes he is out doing appointments and errands and may go for longer periods of time without eating.   Filed Weights   04/12/18 1515  Weight: 188 lb (85.3 kg)   Preferred Learning Style: (legally blind, but has enough vision that he has equipment at home he can use to help see written material)   No preference indicated   Learning Readiness:   Ready  MEDICATIONS: reviewed   DIETARY INTAKE:  Avoided foods include soda. Not working out 1 protein shake, another if working out.  Got food stamps today  24-hr recall:  B ( AM): left over pizza today, yesterday eggs, sausage Snk ( AM): breakfast bars L ( PM): sandwich Snk ( PM): none OR protein shakes D ( PM): pizza, chicken Snk ( PM): none Beverages: water, juice  Usual physical activity: weight lifting, 3x week, 45-90 min  Estimated energy needs: 2400 calories 270 g carbohydrates 150 g protein 80 g fat  Progress Towards Goal(s):  In progress.   Nutritional Diagnosis:  [place]-3.2 Unintentional weight loss As related to inadequate intake to compensate for seizure medication side effect of weight loss.  As evidenced by dietary recall and MD evaluation of weight loss cause.    Intervention:  Nutrition Education. Discussed how to include more snacks to help with calorie intake. Discussed ways to include more calories when eating fruit..  Plan: Continue using mayo on your sandwiches Continue to do whole milk products Baked  potatoes with butter, sour cream, cheese Continue with Peanuts and peanut butter Avocados are a good vegetable that has more calories, Consider trying peanut butter on your apples and bananas. Think about snacks that you can carry with you when you are out doing errands.   Teaching Method Utilized:  Visual (large print handouts) Auditory  Handouts given during visit include:  none  Barriers to learning/adherence to lifestyle change: none  Demonstrated degree of understanding via:  Teach Back   Monitoring/Evaluation:  Dietary intake, exercise, and body weight in 4 week(s). 

## 2018-04-12 NOTE — Patient Instructions (Addendum)
Continue using mayo on your sandwiches Continue to do whole milk products Baked potatoes with butter, sour cream, cheese Continue with Peanuts and peanut butter Avocados are a good vegetable that has more calories, Consider trying peanut butter on your apples and bananas. Think about snacks that you can carry with you when you are out doing errands.

## 2018-07-12 ENCOUNTER — Ambulatory Visit: Payer: Medicaid Other | Admitting: Registered"

## 2018-07-12 NOTE — Progress Notes (Deleted)
Medical Nutrition Therapy:  Appt start time: 5997 end time:  7414.  Follow-up Assessment:  Primary concerns today: Pt returns for assessment/guidance to help gain weight. Pt states he feels good that he has gained 5 lbs since he first started coming in. Patient has reduce days at the gym to 3 instead of 4x week.  Pt states sometimes he has to push himself emotionally to get out of bed, but feels he gets enough motivation to push through depression. Pt states he talks to his pastor.   Patient states that sometimes he is out doing appointments and errands and may go for longer periods of time without eating.   Filed Weights   04/12/18 1515  Weight: 188 lb (85.3 kg)   Preferred Learning Style: (legally blind, but has enough vision that he has equipment at home he can use to help see written material)   No preference indicated   Learning Readiness:   Ready  MEDICATIONS: reviewed   DIETARY INTAKE:  Avoided foods include soda. Not working out 1 protein shake, another if working out.  Got food stamps today  24-hr recall:  B ( AM): left over pizza today, yesterday eggs, sausage Snk ( AM): breakfast bars L ( PM): sandwich Snk ( PM): none OR protein shakes D ( PM): pizza, chicken Snk ( PM): none Beverages: water, juice  Usual physical activity: weight lifting, 3x week, 45-90 min  Estimated energy needs: 2400 calories 270 g carbohydrates 150 g protein 80 g fat  Progress Towards Goal(s):  In progress.   Nutritional Diagnosis:  [place]-3.2 Unintentional weight loss As related to inadequate intake to compensate for seizure medication side effect of weight loss.  As evidenced by dietary recall and MD evaluation of weight loss cause.    Intervention:  Nutrition Education. Discussed how to include more snacks to help with calorie intake. Discussed ways to include more calories when eating fruit..  Plan: Continue using mayo on your sandwiches Continue to do whole milk products Baked  potatoes with butter, sour cream, cheese Continue with Peanuts and peanut butter Avocados are a good vegetable that has more calories, Consider trying peanut butter on your apples and bananas. Think about snacks that you can carry with you when you are out doing errands.   Teaching Method Utilized:  Visual (large print handouts) Auditory  Handouts given during visit include:  none  Barriers to learning/adherence to lifestyle change: none  Demonstrated degree of understanding via:  Teach Back   Monitoring/Evaluation:  Dietary intake, exercise, and body weight in 4 week(s).

## 2018-07-15 ENCOUNTER — Emergency Department (HOSPITAL_COMMUNITY)
Admission: EM | Admit: 2018-07-15 | Discharge: 2018-07-15 | Discharge status: Against Medical Advice | Payer: Medicaid Other | Attending: Emergency Medicine | Admitting: Emergency Medicine

## 2018-07-15 DIAGNOSIS — Z5329 Procedure and treatment not carried out because of patient's decision for other reasons: Secondary | ICD-10-CM | POA: Diagnosis not present

## 2018-07-15 DIAGNOSIS — Z982 Presence of cerebrospinal fluid drainage device: Secondary | ICD-10-CM | POA: Diagnosis not present

## 2018-07-15 DIAGNOSIS — J45909 Unspecified asthma, uncomplicated: Secondary | ICD-10-CM | POA: Insufficient documentation

## 2018-07-15 DIAGNOSIS — Z79899 Other long term (current) drug therapy: Secondary | ICD-10-CM | POA: Insufficient documentation

## 2018-07-15 DIAGNOSIS — R569 Unspecified convulsions: Secondary | ICD-10-CM | POA: Diagnosis not present

## 2018-07-15 MED ORDER — LEVETIRACETAM 500 MG PO TABS
1000.0000 mg | ORAL_TABLET | Freq: Once | ORAL | Status: AC
  Administered 2018-07-15: 1000 mg via ORAL
  Filled 2018-07-15: qty 2

## 2018-07-15 NOTE — ED Notes (Signed)
Pt says he told EMS that he did not want to come to the hospital and wants to leave

## 2018-07-15 NOTE — ED Triage Notes (Signed)
Transported by [facility] -- hx of brain tumor and seizures. Has not taken Keppra in a few days. VSS with EMS. CBG read 117. Patient is visually impaired r/t brain surgery.

## 2018-07-15 NOTE — ED Provider Notes (Signed)
[facility] DEPT Provider Note   CSN: [id number] Arrival date & time: 07/15/18  1735     History   Chief Complaint Chief Complaint  Patient presents with  . Seizures    HPI Don Reed is a 33 y.o. male.  HPI   Don Reed is a 33 y.o. male, with a history of asthma, seizures, and migraines, presenting to the ED with seizure today. He was at work and had a seizure, coworkers Pharmacist, community. No seizure activity during transport.  Complains of a mild to moderate generalized headache, which is typically for him following a seizure.  He states he has not had his Keppra in at last 2 days stating financial concerns.  He was paid today and was planning on filling his prescription after work, but had a seizure first.   Patient began having seizures or years ago.  He has been under regular care of his neurologist, Dr. Delice Lesch.   Denies fever/chills, neck/back pain, chest pain, shortness of breath, cough, abdominal pain, N/V/D, recent illness, incontinence, oral trauma, or any other complaints.       Past Medical History:  Diagnosis Date  . Asthma   . Cancer ([place])   . Chronic back pain   . Hydrocephalus   . Legally blind   . Migraine   . Seizure ([place])   . Seizures ([place])   . Vision loss     Patient Active Problem List   Diagnosis Date Noted  . Unintentional weight loss 11/02/2017  . Localization-related (focal) (partial) symptomatic epilepsy and epileptic syndromes with complex partial seizures, not intractable, without status epilepticus ([place]) 02/14/2016  . Migraine without aura and without status migrainosus, not intractable 02/14/2016  . Generalized seizure disorder ([place]) 04/06/2014  . Brain tumor (benign) ([place]) 04/06/2014  . S/P ventricular shunt placement 04/06/2014  . S/P cervical spinal fusion 04/06/2014  . Neck pain 04/06/2014    Past Surgical History:  Procedure Laterality Date  . BRAIN SURGERY  age 69   tumor  . SPINAL  FUSION  2011        Home Medications    Prior to Admission medications   Medication Sig Start Date End Date Taking? Authorizing Provider  CREATINE PO Take by mouth.    [provider]  diphenhydramine-acetaminophen (TYLENOL PM) 25-500 MG TABS tablet Take 2 tablets by mouth 3 (three) times daily as needed (pain).    [provider]  levETIRAcetam (KEPPRA) 500 MG tablet Take 3 tabs twice a day 03/15/18   Cameron Sprang, MD  Multiple Vitamins-Minerals (MULTIVITAMIN ADULT PO) Take 1 tablet by mouth daily.    [provider]  nortriptyline (PAMELOR) 50 MG capsule Take 1 capsule (50 mg total) by mouth at bedtime. 03/15/18   Cameron Sprang, MD  ondansetron (ZOFRAN ODT) 4 MG disintegrating tablet Take 1 tablet (4 mg total) by mouth every 8 (eight) hours as needed for nausea or vomiting. Patient not taking: Reported on 03/15/2018 02/21/17   Lorin Glass, PA-C  PROVENTIL HFA 108 (90 BASE) MCG/ACT inhaler Inhale 2 puffs into the lungs every 6 (six) hours as needed for shortness of breath.  11/13/14   [provider]  valACYclovir (VALTREX) 500 MG tablet TK 1 T PO BID 01/12/18   [provider]  zonisamide (ZONEGRAN) 100 MG capsule Take 4 capsules at night 03/15/18   Cameron Sprang, MD    Family History Family History  Problem Relation Age of Onset  . Hypertension Mother   .  Seizures Mother   . Cataracts Mother   . Stroke Mother   . Stroke Father   . Seizures Father   . Scoliosis Sister   . Vision loss Sister   . Vision loss Brother   . Diabetes Brother   . Leukemia Brother     Social History Social History   Tobacco Use  . Smoking status: Never Smoker  . Smokeless tobacco: Never Used  Substance Use Topics  . Alcohol use: No  . Drug use: No     Allergies   Shellfish allergy; Amoxicillin; and Vancomycin   Review of Systems Review of Systems  Constitutional: Negative for chills, diaphoresis and fever.  HENT: Negative for  dental problem.   Eyes: Negative for visual disturbance.  Respiratory: Negative for cough and shortness of breath.   Cardiovascular: Negative for chest pain.  Gastrointestinal: Negative for abdominal pain, diarrhea, nausea and vomiting.  Genitourinary: Negative for dysuria and hematuria.  Musculoskeletal: Negative for back pain and neck pain.  Neurological: Positive for seizures and headaches. Negative for dizziness, weakness, light-headedness and numbness.  All other systems reviewed and are negative.    Physical Exam Updated Vital Signs BP 110/81 (BP Location: Right Arm)   Pulse 61   Temp 97.8 F (36.6 C) (Oral)   Resp 18   SpO2 100%   Physical Exam Vitals signs and nursing note reviewed.  Constitutional:      General: He is not in acute distress.    Appearance: He is well-developed. He is not diaphoretic.  HENT:     Head: Normocephalic and atraumatic.     Comments: No noted tenderness, swelling, deformities, instability, or wounds noted to the head or face.    Right Ear: Tympanic membrane, ear canal and external ear normal.     Left Ear: Tympanic membrane, ear canal and external ear normal.     Nose: Nose normal.     Mouth/Throat:     Mouth: Mucous membranes are moist.     Pharynx: Oropharynx is clear.     Comments: No intraoral trauma noted. Eyes:     Extraocular Movements: Extraocular movements intact.     Conjunctiva/sclera: Conjunctivae normal.     Pupils: Pupils are equal, round, and reactive to light.  Neck:     Musculoskeletal: Normal range of motion and neck supple.  Cardiovascular:     Rate and Rhythm: Normal rate and regular rhythm.     Pulses: Normal pulses.     Heart sounds: Normal heart sounds.  Pulmonary:     Effort: Pulmonary effort is normal. No respiratory distress.     Breath sounds: Normal breath sounds.  Abdominal:     Palpations: Abdomen is soft.     Tenderness: There is no abdominal tenderness. There is no guarding.  Musculoskeletal:      Right lower leg: No edema.     Left lower leg: No edema.     Comments: Normal motor function intact in all extremities. No midline spinal tenderness.   Lymphadenopathy:     Cervical: No cervical adenopathy.  Skin:    General: Skin is warm and dry.     Capillary Refill: Capillary refill takes less than 2 seconds.  Neurological:     Mental Status: He is alert and oriented to person, place, and time.     Comments: Sensation grossly intact to light touch in the extremities. Strength 5/5 in all extremities. No gait disturbance. Coordination intact. Cranial nerves III-XII grossly intact. No facial droop.  Psychiatric:        Mood and Affect: Mood and affect normal.        Speech: Speech normal.        Behavior: Behavior normal.      ED Treatments / Results  Labs (all labs ordered are listed, but only abnormal results are displayed) Labs Reviewed - No data to display  EKG None  Radiology No results found.  Procedures Procedures (including critical care time)  Medications Ordered in ED Medications  levETIRAcetam (KEPPRA) tablet 1,000 mg (1,000 mg Oral Given 07/15/18 1820)     Initial Impression / Assessment and Plan / ED Course  I have reviewed the triage vital signs and the nursing notes.  Pertinent labs & imaging results that were available during my care of the patient were reviewed by me and considered in my medical decision making (see chart for details).     Patient presents following a seizure. Patient is nontoxic appearing, afebrile, not tachycardic, not tachypneic, not hypotensive, excellent SPO2 on room air, and is in no apparent distress.  Patient notes he did not want to come to the hospital.  He wanted to actually leave as soon as he arrived.  I was able to convince patient to allow me to perform an exam and administer loading dose of Keppra. Accepted PO keppra, but declined labs, imaging even for VP shunt evaluation, IV keppra.  Patient was pleasant and readily  interactive.  Alert and oriented x4 throughout my time with the patient.  He states, "I understand why you would want to do further testing.  I just do not want to stay for those things."  Due to the patient declining further evaluation, he was signed out [place], however, I do suspect he had a seizure today from not taking his medication.  He assures me he will be picking up his medication after leaving the ED. He was encouraged to follow-up with his PCP and/or neurologist.  He has been told he is welcome and encouraged to return should he decide to allow further evaluation or should he have another seizure.  Patient voices understanding of these instructions.    Final Clinical Impressions(s) / ED Diagnoses   Final diagnoses:  Seizure [facility])    ED Discharge Orders    None       Layla Maw 07/15/18 [place], [place], DO 07/16/18 0124

## 2018-07-15 NOTE — ED Notes (Signed)
Pt given oral Keppra with water, IV removed.

## 2018-07-15 NOTE — ED Notes (Signed)
Bed: PZ96 Expected date:  Expected time:  Means of arrival:  Comments: EMS seizure

## 2018-07-15 NOTE — Discharge Instructions (Addendum)
You have chosen to leave [facility]. Should you change your mind, you are always welcome and encouraged to return to the ED. You are encouraged to follow-up with, at the very least, a primary care provider, or other similar medical professional on this matter.  Follow-up with your neurologist on this matter.  Be sure to take your medications, as prescribed.  If there are financial concerns, you may contact the office of your neurologist for further resources.  May also contact [facility] and speak with a case Freight forwarder. Return to the ED for recurrent seizures or any other major concerns.

## 2018-07-19 ENCOUNTER — Encounter: Payer: Self-pay | Admitting: Neurology

## 2018-07-19 ENCOUNTER — Ambulatory Visit (INDEPENDENT_AMBULATORY_CARE_PROVIDER_SITE_OTHER): Payer: Medicaid Other | Admitting: Neurology

## 2018-07-19 ENCOUNTER — Other Ambulatory Visit: Payer: Self-pay

## 2018-07-19 VITALS — BP 102/68 | HR 53 | Ht 73.0 in | Wt 187.0 lb

## 2018-07-19 DIAGNOSIS — G40201 Localization-related (focal) (partial) symptomatic epilepsy and epileptic syndromes with complex partial seizures, not intractable, with status epilepticus: Secondary | ICD-10-CM

## 2018-07-19 DIAGNOSIS — G43009 Migraine without aura, not intractable, without status migrainosus: Secondary | ICD-10-CM

## 2018-07-19 MED ORDER — ZONISAMIDE 100 MG PO CAPS: ORAL_CAPSULE | ORAL | 3 refills | Status: DC

## 2018-07-19 MED ORDER — NORTRIPTYLINE HCL 50 MG PO CAPS: 50.0000 mg | ORAL_CAPSULE | Freq: Every day | ORAL | 3 refills | Status: DC

## 2018-07-19 MED ORDER — LEVETIRACETAM 500 MG PO TABS: ORAL_TABLET | ORAL | 3 refills | Status: DC

## 2018-07-19 NOTE — Patient Instructions (Addendum)
1. Continue Zonisamide 400mg  every night and Keppra 1500mg  twice a day 2. Continue nortriptyline 50mg  every night 3. Follow-up as scheduled in April, call for any changes  Seizure Precautions: 1. If medication has been prescribed for you to prevent seizures, take it exactly as directed.  Do not stop taking the medicine without talking to your doctor first, even if you have not had a seizure in a long time.   2. Avoid activities in which a seizure would cause danger to yourself or to others.  Don't operate dangerous machinery, swim alone, or climb in high or dangerous places, such as on ladders, roofs, or girders.  Do not drive unless your doctor says you may.  3. If you have any warning that you may have a seizure, lay down in a safe place where you can't hurt yourself.    4.  No driving for 6 months from last seizure, as per [facility].   Please refer to the following link on the [place] website for more information: http://[email or web address].org/[email or web address]/driving/drivingu.cfm   5.  Maintain good sleep hygiene. Avoid alcohol.  6.  Contact your doctor if you have any problems that may be related to the medicine you are taking.  7.  Call 911 and bring the patient back to the ED if:        A.  The seizure lasts longer than 5 minutes.       B.  The patient doesn't awaken shortly after the seizure  C.  The patient has new problems such as difficulty seeing, speaking or moving  D.  The patient was injured during the seizure  E.  The patient has a temperature over 102 F (39C)  F.  The patient vomited and now is having trouble breathing

## 2018-07-19 NOTE — Progress Notes (Signed)
NEUROLOGY FOLLOW UP OFFICE NOTE  Don Reed [id number]  DOB 1986/06/13  HISTORY OF PRESENT ILLNESS: I had the pleasure of seeing Don Reed in follow-up in the neurology clinic on 07/19/2018. The patient was last seen 4 months ago for seizures and headaches. He had been seizure-free for more than a year until a seizure on 07/15/2018 at work. He was travelling to [place] a few days prior and had missed 3 doses of his Keppra. He reports still taking the Zonisamide. He denies any prior warning, he woke up to EMS around him and found he had wet himself. No focal weakness or injuries. He is now back on Keppra 1500mg  BID and Zonisamide 400mg  qhs with no side effects. On his last visit, he was also reporting a increase in headaches, dose of nortriptyline increased to 50mg  qhs which has helped a lot, he is not having headaches like before. No side effects on nortriptyline. He has noticed that standing for prolonged periods can trigger a headache. He has been working at [employer] for the past 1.5 weeks and found that working on a daily basis is too much for his body. He has noticed his new glasses help with his balance a little.   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 He states that at age 59, he was having episodes of slurred speech, falls, recurrent syncope, and headaches, and was found to have the brain tumor. He recalls when he was younger, his head kept turning to one side and he would try to stop it. After the surgery, he did fairly well except for frequent headaches, then had his first seizure at age 3 or 79. He describes seizures as starting with back pain, then he gets a metallic taste in his mouth, his head starts hurting more, then he gets disoriented and has been told he would become unresponsive then proceed to generalized shaking, at times with urinary incontinence. He lives with his  fiancee and brother, and denies any isolated staring spells. He has been told he has seizures in his sleep. After a seizure, he denies any focal weakness, he can hear people around him but has difficulty getting his words out. He also has some nausea. He has fallen twice this year with his seizures, he was in the ER on 09/05/15 and 12/02/15 for seizure, during both ER visits, he would have a witnessed seizure in the ER. With the most recent seizure on 12/02/15, he had ran out of Vimpat. He has been taking Vimpat 100mg  BID since 2015, on Keppra 1500mg  BID since 2011. He recalls taking Depakote and Topamax in the past, with cognitive side effects. He continues to report memory issues, "I can't think too good." He feels his left side is different, with numbness in his left arm and leg. He had cervical fusion surgery and has been told this is the cause of his left-sided symptoms. His last shunt revision was in 1999. 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   He has a history of chronic headaches, with throbbing headaches occurring around twice a week over the frontal and temporal regions, with associated nausea, vomiting, photo/phonophobia. They would last for 1 to 1-1/2 days, he does not take any medication because he feels they have provoked seizures in the past. He reports taking an unrecalled headache preventative medication in [place] which  was helpful. He denies any dizziness, bowel/bladder dysfunction, no myoclonic jerks. He has occasional twitching in an arm or leg. He has chronic neck and back pain.   Epilepsy Risk Factors:  His father had generalized convulsions, his mother had seizures as well. He has a history of benign brain tumor s/p VP shunt, last revision in 1999. He reports several head injuries with loss of consciousness, most recently when hit in the face by a ball a few years ago. Otherwise he had a normal birth and early development.  There is no history of febrile convulsions, CNS  infections such as meningitis/encephalitis.  Prior AEDs: Depakote, Topamax, Vimpat  Diagnostic Data: MRI brain with and without contrast 07/27/2016: no acute changes seen. There were stable postsurgical changes in the posterior fossa related to resection of previous mass. 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 No recurrent mass or abnormal enhancement seen. There is a right transfrontal ventricular shunt seen terminating within the right ventricle. Hippocami symmetric.  MRI cervical spine in 07/2017 showed moderate stenosis at C3-4, moderate foraminal encroachment right greater than left at C3-4, no change from prior MRI. There is moderate right foraminal narrowing at C4-5 and mild left foraminal narrowing at C4-5, unchanged. 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    PAST MEDICAL HISTORY: Past Medical History:  Diagnosis Date  . Asthma   . Cancer ([place])   . Chronic back pain   . Hydrocephalus ([place])   . Legally blind   . Migraine   . Seizure ([place])   . Seizures ([place])   . Vision loss     MEDICATIONS:  Outpatient Encounter Medications as of 07/19/2018  Medication Sig  . CREATINE PO Take by mouth.  . diphenhydramine-acetaminophen (TYLENOL PM) 25-500 MG TABS tablet Take 2 tablets by mouth 3 (three) times daily as needed (pain).  Marland Kitchen levETIRAcetam (KEPPRA) 500 MG tablet Take 3 tabs twice a day  . Multiple Vitamins-Minerals (MULTIVITAMIN ADULT PO) Take 1 tablet by mouth daily.  . nortriptyline (PAMELOR) 50 MG capsule Take 1 capsule (50 mg total) by mouth at bedtime.  . ondansetron (ZOFRAN ODT) 4 MG disintegrating tablet Take 1 tablet (4 mg total) by mouth every 8 (eight) hours as needed for nausea or vomiting.  Marland Kitchen PROVENTIL HFA 108 (90 BASE) MCG/ACT inhaler Inhale 2 puffs into the lungs every  6 (six) hours as needed for shortness of breath.   . valACYclovir (VALTREX) 500 MG tablet TK 1 T PO BID  . zonisamide (ZONEGRAN) 100 MG capsule Take 4 capsules at night   No facility-administered encounter medications on file as of 07/19/2018.     ALLERGIES: Allergies  Allergen Reactions  . Shellfish Allergy Anaphylaxis    Cannot breathe   . Amoxicillin Itching    Has patient had a PCN reaction causing immediate rash, facial/tongue/throat swelling, SOB or lightheadedness with hypotension: No Has patient had a PCN reaction causing severe rash involving mucus membranes or skin necrosis: No Has patient had a PCN reaction that required hospitalization No Has patient had a PCN reaction occurring within the last 10 years: Yes If all of the above answers are "NO", then may proceed with Cephalosporin use.   . Vancomycin Itching    FAMILY HISTORY: Family History  Problem Relation Age of Onset  . Hypertension Mother   . Seizures Mother   .  Cataracts Mother   . Stroke Mother   . Stroke Father   . Seizures Father   . Scoliosis Sister   . Vision loss Sister   . Vision loss Brother   . Diabetes Brother   . Leukemia Brother     SOCIAL HISTORY: Social History   Socioeconomic History  . Marital status: Legally Separated    Spouse name: Not on file  . Number of children: 2  . Years of education: GED  . Highest education level: Not on file  Occupational History    Employer: [employer]  Social Needs  . Financial resource strain: Not on file  . Food insecurity:    Worry: Not on file    Inability: Not on file  . Transportation needs:    Medical: Not on file    Non-medical: Not on file  Tobacco Use  . Smoking status: Never Smoker  . Smokeless tobacco: Never Used  Substance and Sexual Activity  . Alcohol use: No  . Drug use: No  . Sexual activity: Not on file  Lifestyle  . Physical activity:    Days per week: Not on file    Minutes per session: Not on file  .  Stress: Not on file  Relationships  . Social connections:    Talks on phone: Not on file    Gets together: Not on file    Attends religious service: Not on file    Active member of club or organization: Not on file    Attends meetings of clubs or organizations: Not on file    Relationship status: Not on file  . Intimate partner violence:    Fear of current or ex partner: Not on file    Emotionally abused: Not on file    Physically abused: Not on file    Forced sexual activity: Not on file  Other Topics Concern  . Not on file  Social History Narrative   Patient lives at home with family.   Caffeine Use: 1-3 time weekly    REVIEW OF SYSTEMS: Constitutional: No fevers, chills, or sweats, no generalized fatigue, change in appetite Eyes: No visual changes, double vision, eye pain Ear, nose and throat: No hearing loss, ear pain, nasal congestion, sore throat Cardiovascular: No chest pain, palpitations Respiratory:  No shortness of breath at rest or with exertion, wheezes GastrointestinaI: No nausea, vomiting, diarrhea, abdominal pain, fecal incontinence Genitourinary:  No dysuria, urinary retention or frequency Musculoskeletal:  +neck pain, +back pain Integumentary: No rash, pruritus, skin lesions Neurological: as above Psychiatric: No depression, insomnia, anxiety Endocrine: No palpitations, fatigue, diaphoresis, mood swings, change in appetite, change in weight, increased thirst Hematologic/Lymphatic:  No anemia, purpura, petechiae. Allergic/Immunologic: no itchy/runny eyes, nasal congestion, recent allergic reactions, rashes  PHYSICAL EXAM: Vitals:   07/19/18 1438  BP: 102/68  Pulse: (!) 53  SpO2: 99%   General: No acute distress Head:  Normocephalic/atraumatic Neck: supple, no paraspinal tenderness, full range of motion Back: No paraspinal tenderness Heart: regular rate and rhythm Lungs: Clear to auscultation bilaterally. Vascular: No carotid bruits. Skin/Extremities:  No rash, no edema Neurological Exam: Mental status: alert and oriented to person, place, and time, no dysarthria or aphasia, Fund of knowledge is appropriate.  Recent and remote memory are intact. Attention and concentration are normal.    Able to name objects and repeat phrases. Cranial nerves: CN I: not tested CN II: pupils equal, briskly reactive on left, sluggish on right (similar to prior), visual acuity: hand movements  on right, visual fields intact (similar to prior) CN III, IV, VI:  full range of motion, no ptosis, nystagmoid eye movements to all directions of gaze (similar to prior) CN VII: upper and lower face symmetric CN VIII: hearing intact to conversation CN IX, X: gag intact, uvula midline CN XI: sternocleidomastoid and trapezius muscles intact CN XII: tongue midline Bulk & Tone: normal, no fasciculations. Motor: 5/5 throughout with no pronator drift. Sensation: decreased light touch on left UE and LE (similar to prior) Plantar responses: downgoing bilaterally Cerebellar: no incoordination on finger to nose Gait: narrow-based and steady, difficulty with tandem walk  IMPRESSION: This is a pleasant 33 yo RH man with a history of benign brain tumor resection s/p VP shunt at age 72, chronic neck and back pain, and seizures since age 3 suggestive of focal to bilateral tonic-clonic seizures. EEG normal, MRI brain in 06/2016 showed no acute changes with postsurgical posterior fossa changes seen, right transfrontal shunt in place. He had been seizure-free for more than a year until a seizure last 07/15/2018 after missing 3 doses of Keppra. He is now back on Keppra 1500mg  BID and Zonisamide 400mg  qhs, refills sent. We discussed avoidance of seizure triggers, including missing medication, alcohol, and sleep deprivation. He has had a good response to nortriptyline 50mg  qhs with significant reduction in headaches, refills sent. Restrictions and accommodations for work discussed, letter provided  today. He does not drive. He will follow-up as scheduled in April and knows to call for any changes.   Thank you for allowing me to participate in his care.  Please do not hesitate to call for any questions or concerns.  The duration of this appointment visit was 27 minutes of face-to-face time with the patient.  Greater than 50% of this time was spent in counseling, explanation of diagnosis, planning of further management, and coordination of care.   Ellouise Newer, M.D.   CC: Dr. Criss Rosales

## 2018-08-01 ENCOUNTER — Telehealth: Payer: Self-pay | Admitting: Neurology

## 2018-08-01 NOTE — Telephone Encounter (Signed)
Patient left vm about seizure yesterday and about being in some medical clinic in [place]. He was wanting to speak with you. Please call him back. Thanks!

## 2018-08-02 MED ORDER — ZONISAMIDE 100 MG PO CAPS
400.00 | ORAL_CAPSULE | ORAL | Status: DC
Start: 2018-08-02 — End: 2018-08-02

## 2018-08-02 MED ORDER — GENERIC EXTERNAL MEDICATION
2.50 | Status: DC
Start: ? — End: 2018-08-02

## 2018-08-02 MED ORDER — GENERIC EXTERNAL MEDICATION
15.00 | Status: DC
Start: ? — End: 2018-08-02

## 2018-08-02 MED ORDER — LORAZEPAM 2 MG/ML IJ SOLN
1.00 | INTRAMUSCULAR | Status: DC
Start: ? — End: 2018-08-02

## 2018-08-02 MED ORDER — GENERIC EXTERNAL MEDICATION
25.00 | Status: DC
Start: ? — End: 2018-08-02

## 2018-08-02 MED ORDER — ENOXAPARIN SODIUM 40 MG/0.4ML ~~LOC~~ SOLN
40.00 | SUBCUTANEOUS | Status: DC
Start: 2018-08-02 — End: 2018-08-02

## 2018-08-02 MED ORDER — FAMOTIDINE 20 MG/2ML IV SOLN
20.00 | INTRAVENOUS | Status: DC
Start: 2018-08-01 — End: 2018-08-02

## 2018-08-02 MED ORDER — DEXTROSE-NACL 5-0.45 % IV SOLN
100.00 | INTRAVENOUS | Status: DC
Start: ? — End: 2018-08-02

## 2018-08-02 MED ORDER — LEVETIRACETAM 500 MG PO TABS
1500.00 | ORAL_TABLET | ORAL | Status: DC
Start: 2018-08-01 — End: 2018-08-02

## 2018-08-02 MED ORDER — LACOSAMIDE 100 MG PO TABS
100.00 | ORAL_TABLET | ORAL | Status: DC
Start: 2018-08-01 — End: 2018-08-02

## 2018-08-04 NOTE — Telephone Encounter (Signed)
I reviewed notes, they said he has not missed any doses of medications, is that accurate? If so, I would increase the dose of his Zonisamide to 500mg  qhs. Continue Keppra 1500mg  BID. Thanks

## 2018-08-04 NOTE — Telephone Encounter (Signed)
Spoke with pt.  He states that he is currently in [place].  Due to his recent homelessness, he thought it best to go to [facility] where he has family.  He experienced 7 seizures back-to-back on 02.02.2020 and was taken to [facility] (notes under Care Everywhere).  Pt states that he does not trust the doctors down there since they are not familiar with his case.  He states Dr. Delice Lesch "got them under control" and therefore he wishes for her guidance.  Pt does go on to state that since his hospital visit both hands and L eye have been twitching constantly.  States that he "just doesn't feel the same" but could not give any further detail.  pls advise.

## 2018-08-10 NOTE — Telephone Encounter (Signed)
Spoke with pt relaying message below.  He states that since our last conversation he has been feeling "more like myself".  I advised him to increase Zonisamide - he is agreeable to this.  I did ask that he give me a call in a week or 2 with an update - if better I will send Rx for increase.

## 2018-08-17 ENCOUNTER — Telehealth: Payer: Self-pay | Admitting: Neurology

## 2018-08-17 DIAGNOSIS — G40201 Localization-related (focal) (partial) symptomatic epilepsy and epileptic syndromes with complex partial seizures, not intractable, with status epilepticus: Secondary | ICD-10-CM

## 2018-08-17 DIAGNOSIS — G43009 Migraine without aura, not intractable, without status migrainosus: Secondary | ICD-10-CM

## 2018-08-17 NOTE — Telephone Encounter (Signed)
We changed the dosage from 4 to 5 a day and he needs a new RX sent to [facility] on [address]   Zonisamide

## 2018-08-18 MED ORDER — ZONISAMIDE 100 MG PO CAPS: ORAL_CAPSULE | ORAL | 3 refills | Status: DC

## 2018-08-18 NOTE — Telephone Encounter (Signed)
Zonisamide 100mg  #450 with 3 refills Sig = take 5 tabs QHS  Sent to [facility] on [address]

## 2018-08-18 NOTE — Telephone Encounter (Signed)
Patient is calling in again about medication. Thank you.

## 2018-09-08 ENCOUNTER — Other Ambulatory Visit: Payer: Self-pay

## 2018-09-08 ENCOUNTER — Emergency Department (HOSPITAL_COMMUNITY): Payer: Medicaid Other

## 2018-09-08 ENCOUNTER — Encounter (HOSPITAL_COMMUNITY): Payer: Self-pay | Admitting: Emergency Medicine

## 2018-09-08 ENCOUNTER — Emergency Department (HOSPITAL_COMMUNITY)
Admission: EM | Admit: 2018-09-08 | Discharge: 2018-09-08 | Disposition: A | Discharge status: Home / Self Care | Payer: Medicaid Other | Attending: Emergency Medicine | Admitting: Emergency Medicine

## 2018-09-08 DIAGNOSIS — J45909 Unspecified asthma, uncomplicated: Secondary | ICD-10-CM | POA: Insufficient documentation

## 2018-09-08 DIAGNOSIS — G40909 Epilepsy, unspecified, not intractable, without status epilepticus: Secondary | ICD-10-CM | POA: Diagnosis not present

## 2018-09-08 DIAGNOSIS — Z79899 Other long term (current) drug therapy: Secondary | ICD-10-CM | POA: Insufficient documentation

## 2018-09-08 DIAGNOSIS — R569 Unspecified convulsions: Secondary | ICD-10-CM

## 2018-09-08 LAB — BASIC METABOLIC PANEL
Anion gap: 7 (ref 5–15)
BUN: 8 mg/dL (ref 6–20)
CO2: 24 mmol/L (ref 22–32)
Calcium: 9.3 mg/dL (ref 8.9–10.3)
Chloride: 108 mmol/L (ref 98–111)
Creatinine, Ser: 1.13 mg/dL (ref 0.61–1.24)
GFR calc Af Amer: >60 mL/min (ref >60)
GFR calc non Af Amer: >60 mL/min (ref >60)
Glucose, Bld: 86 mg/dL (ref 70–99)
POTASSIUM: 3.5 mmol/L (ref 3.5–5.1)
Sodium: 139 mmol/L (ref 135–145)

## 2018-09-08 LAB — CBC WITH DIFFERENTIAL/PLATELET
Abs Immature Granulocytes: 0.01 10*3/uL (ref 0.00–0.07)
Basophils Absolute: 0.1 10*3/uL (ref 0.0–0.1)
Basophils Relative: 1 %
Eosinophils Absolute: 0 10*3/uL (ref 0.0–0.5)
Eosinophils Relative: 1 %
HCT: 40.6 % (ref 39.0–52.0)
Hemoglobin: 12.6 g/dL — ABNORMAL LOW (ref 13.0–17.0)
IMMATURE GRANULOCYTES: 0 %
Lymphocytes Relative: 28 %
Lymphs Abs: 1.9 10*3/uL (ref 0.7–4.0)
MCH: 25.7 pg — ABNORMAL LOW (ref 26.0–34.0)
MCHC: 31 g/dL (ref 30.0–36.0)
MCV: 82.9 fL (ref 80.0–100.0)
MONO ABS: 0.6 10*3/uL (ref 0.1–1.0)
Monocytes Relative: 10 %
Neutro Abs: 4 10*3/uL (ref 1.7–7.7)
Neutrophils Relative %: 60 %
PLATELETS: 208 10*3/uL (ref 150–400)
RBC: 4.9 MIL/uL (ref 4.22–5.81)
RDW: 12.7 % (ref 11.5–15.5)
WBC: 6.6 10*3/uL (ref 4.0–10.5)
nRBC: 0 % (ref 0.0–0.2)

## 2018-09-08 MED ORDER — LEVETIRACETAM IN NACL 500 MG/100ML IV SOLN
500.0000 mg | Freq: Two times a day (BID) | INTRAVENOUS | Status: DC
  Administered 2018-09-08: 500 mg via INTRAVENOUS
  Filled 2018-09-08 (×2): qty 100

## 2018-09-08 MED ORDER — IBUPROFEN 400 MG PO TABS
600.0000 mg | ORAL_TABLET | Freq: Once | ORAL | Status: AC
  Administered 2018-09-08: 600 mg via ORAL
  Filled 2018-09-08: qty 1

## 2018-09-08 NOTE — Discharge Instructions (Signed)
As discussed, your evaluation today has been largely reassuring.  But, it is important that you monitor your condition carefully, and do not hesitate to return to the ED if you develop new, or concerning changes in your condition. ? ?Otherwise, please follow-up with your physician for appropriate ongoing care. ? ?

## 2018-09-08 NOTE — ED Notes (Signed)
Patient verbalizes understanding of discharge instructions. Opportunity for questioning and answers were provided. Armband removed by staff, pt discharged from ED.  

## 2018-09-08 NOTE — ED Notes (Signed)
Family friend has been called to come and pick pt up

## 2018-09-08 NOTE — ED Triage Notes (Addendum)
[facility] of a witnessed grand mal seizure. EMS reports that the patient has hx of seizures and takes keppra. When pt had the seizure he was sitting in a chair and slid off it without hitting his head or having a fall. Pt complains of a headache and right shoulder pain. Pt still post ictal on arrival.

## 2018-09-08 NOTE — ED Provider Notes (Signed)
[facility] EMERGENCY DEPARTMENT Provider Note   CSN: [id number] Arrival date & time: 09/08/18  1503    History   Chief Complaint Chief Complaint  Patient presents with   Seizures    HPI Don Reed is a 33 y.o. male.     HPI Patient presents with concern of witnessed seizure. Patient is a seizure disorder as well as multiple other medical problems including asthma, headaches, and is legally blind. Today had a witnessed seizure, and was seen sliding from a chair to the ground. The patient himself states that he felt odd prior to the seizure, does have a headache, as well as right shoulder pain. No reported substantial trauma. Patient notes that he sometimes does feel prodrome prior to seizure, and often has a headache following. He denies any weakness anywhere, new confusion, has no vomiting. He states that he has been taking his medication as directed, Keppra.  Past Medical History:  Diagnosis Date   Asthma    Cancer ([place])    Chronic back pain    Hydrocephalus ([place])    Legally blind    Migraine    Seizure ([place])    Seizures ([place])    Vision loss     Patient Active Problem List   Diagnosis Date Noted   Unintentional weight loss 11/02/2017   Localization-related (focal) (partial) symptomatic epilepsy and epileptic syndromes with complex partial seizures, not intractable, without status epilepticus ([place]) 02/14/2016   Migraine without aura and without status migrainosus, not intractable 02/14/2016   Generalized seizure disorder ([place]) 04/06/2014   Brain tumor (benign) ([place]) 04/06/2014   S/P ventricular shunt placement 04/06/2014   S/P cervical spinal fusion 04/06/2014   Neck pain 04/06/2014    Past Surgical History:  Procedure Laterality Date   BRAIN SURGERY  age 29   tumor   SPINAL FUSION  2011        Home Medications    Prior to Admission medications   Medication Sig Start Date End Date Taking? Authorizing  Provider  acetaminophen (TYLENOL) 500 MG tablet Take 500-1,000 mg by mouth every 8 (eight) hours as needed (FOR PAIN OR HEADACHES).   Yes [provider]  diphenhydramine-acetaminophen (TYLENOL PM) 25-500 MG TABS tablet Take 2 tablets by mouth at bedtime as needed (for sleep).    Yes [provider]  levETIRAcetam (KEPPRA) 500 MG tablet Take 3 tabs twice a day Patient taking differently: Take 1,500 mg by mouth 2 (two) times daily.  07/19/18  Yes Cameron Sprang, MD  nortriptyline (PAMELOR) 50 MG capsule Take 1 capsule (50 mg total) by mouth at bedtime. 07/19/18  Yes Cameron Sprang, MD  ondansetron (ZOFRAN ODT) 4 MG disintegrating tablet Take 1 tablet (4 mg total) by mouth every 8 (eight) hours as needed for nausea or vomiting. 02/21/17  Yes Lorin Glass, PA-C  PROVENTIL HFA 108 (90 BASE) MCG/ACT inhaler Inhale 2 puffs into the lungs every 6 (six) hours as needed for shortness of breath.  11/13/14  Yes [provider]  valACYclovir (VALTREX) 500 MG tablet Take 500 mg by mouth 2 (two) times daily as needed (as directed for flares).  01/12/18  Yes [provider]  zonisamide (ZONEGRAN) 100 MG capsule Take 5 capsules at night Patient taking differently: Take 500 mg by mouth at bedtime.  08/18/18  Yes Cameron Sprang, MD    Family History Family History  Problem Relation Age of Onset   Hypertension Mother    Seizures Mother  Cataracts Mother    Stroke Mother    Stroke Father    Seizures Father    Scoliosis Sister    Vision loss Sister    Vision loss Brother    Diabetes Brother    Leukemia Brother     Social History Social History   Tobacco Use   Smoking status: Never Smoker   Smokeless tobacco: Never Used  Substance Use Topics   Alcohol use: No   Drug use: No     Allergies   Amoxicillin; Shellfish allergy; and Vancomycin   Review of Systems Review of Systems  Constitutional:       Per HPI, otherwise negative  HENT:        Per HPI, otherwise negative  Eyes:       Blind  Respiratory:       Per HPI, otherwise negative  Cardiovascular:       Per HPI, otherwise negative  Gastrointestinal: Negative for vomiting.  Endocrine:       Negative aside from HPI  Genitourinary:       Neg aside from HPI   Musculoskeletal:       Per HPI, otherwise negative  Skin: Negative.   Neurological: Positive for seizures. Negative for syncope.     Physical Exam Updated Vital Signs BP 110/73    Pulse (!) 57    Temp 98.2 F (36.8 C) (Oral)    Resp (!) 9    Ht 6\' 1"  (1.854 m)    SpO2 94%    BMI 24.67 kg/m   Physical Exam Vitals signs and nursing note reviewed.  Constitutional:      General: He is not in acute distress.    Appearance: He is well-developed.  HENT:     Head: Normocephalic and atraumatic.  Eyes:     Comments: Blind  Cardiovascular:     Rate and Rhythm: Normal rate and regular rhythm.  Pulmonary:     Effort: Pulmonary effort is normal. No respiratory distress.     Breath sounds: No stridor.  Abdominal:     General: There is no distension.  Musculoskeletal:     Left shoulder: Normal.     Right elbow: Normal.    Right wrist: Normal.       Arms:  Skin:    General: Skin is warm and dry.  Neurological:     Mental Status: He is alert and oriented to person, place, and time.      ED Treatments / Results  Labs (all labs ordered are listed, but only abnormal results are displayed) Labs Reviewed  CBC WITH DIFFERENTIAL/PLATELET - Abnormal; Notable for the following components:      Result Value   Hemoglobin 12.6 (*)    MCH 25.7 (*)    All other components within normal limits  BASIC METABOLIC PANEL    EKG EKG Interpretation  Date/Time:  Thursday September 08 2018 15:08:20 EDT Ventricular Rate:  60 PR Interval:    QRS Duration: 111 QT Interval:  410 QTC Calculation: 410 R Axis:   72 Text Interpretation:  Sinus rhythm Premature ventricular complexes Artifact Abnormal ekg Confirmed by  Carmin Muskrat [id number]) on 09/08/2018 3:42:47 PM   Radiology Dg Shoulder Right  Result Date: 09/08/2018 CLINICAL DATA:  [facility] witnessed grand mal seizure. EMS reports that the patient has hx of seizures and takes keppra. When pt had the seizure he was sitting in a chair and slid off  it without hitting his head or having a fall. Pt complains of a headache and right shoulder pain. Pt still post ictal on arrival. Pain to superior aspect of shoulder. EXAM: RIGHT SHOULDER - 2+ VIEW COMPARISON:  None. FINDINGS: There is no evidence of fracture or dislocation. There is no evidence of arthropathy or other focal bone abnormality. Soft tissues are unremarkable. IMPRESSION: Negative. Electronically Signed   By: Lajean Manes M.D.   On: 09/08/2018 17:15    Procedures Procedures (including critical care time)  Medications Ordered in ED Medications  levETIRAcetam (KEPPRA) IVPB 500 mg/100 mL premix (500 mg Intravenous New Bag/Given 09/08/18 1718)  ibuprofen (ADVIL,MOTRIN) tablet 600 mg (600 mg Oral Given 09/08/18 1718)     Initial Impression / Assessment and Plan / ED Course  I have reviewed the triage vital signs and the nursing notes.  Pertinent labs & imaging results that were available during my care of the patient were reviewed by me and considered in my medical decision making (see chart for details).        6:13 PM On repeat exam the patient is awake, alert, in no distress, talking on a mobile telephone. He has received IV Keppra loading dose No additional seizures here, no ongoing complaints. With reassuring findings, no evidence for fracture, no lab abnormalities suggesting new etiology for his seizures, nor sequelae from them. Patient discharged in stable condition.  Final Clinical Impressions(s) / ED Diagnoses   Final diagnoses:  Seizure [facility])     Carmin Muskrat, MD 09/08/18 1814

## 2018-10-04 ENCOUNTER — Other Ambulatory Visit: Payer: Self-pay

## 2018-10-04 ENCOUNTER — Telehealth (INDEPENDENT_AMBULATORY_CARE_PROVIDER_SITE_OTHER): Payer: Medicaid Other | Admitting: Neurology

## 2018-10-04 DIAGNOSIS — G43001 Migraine without aura, not intractable, with status migrainosus: Secondary | ICD-10-CM

## 2018-10-04 DIAGNOSIS — G40201 Localization-related (focal) (partial) symptomatic epilepsy and epileptic syndromes with complex partial seizures, not intractable, with status epilepticus: Secondary | ICD-10-CM

## 2018-10-04 MED ORDER — NORTRIPTYLINE HCL 50 MG PO CAPS: ORAL_CAPSULE | ORAL | 3 refills | Status: DC

## 2018-10-04 MED ORDER — LEVETIRACETAM 500 MG PO TABS: ORAL_TABLET | ORAL | 3 refills | Status: DC

## 2018-10-04 MED ORDER — ZONISAMIDE 100 MG PO CAPS: ORAL_CAPSULE | ORAL | 3 refills | Status: DC

## 2018-10-04 MED ORDER — PREDNISONE 20 MG PO TABS: ORAL_TABLET | ORAL | 0 refills | Status: DC

## 2018-10-04 NOTE — Progress Notes (Signed)
Virtual Visit via Video Note The purpose of this virtual visit is to provide medical care while limiting exposure to the novel coronavirus.    Consent was obtained for video visit:  Yes.   Answered questions that patient had about telehealth interaction:  Yes.   I discussed the limitations, risks, security and privacy concerns of performing an evaluation and management service by telemedicine. I also discussed with the patient that there may be a patient responsible charge related to this service. The patient expressed understanding and agreed to proceed.  Pt location: Home Physician Location: office Name of referring provider:  Lucianne Lei, MD I connected with Windell Norfolk at patients initiation/request on 10/04/2018 at 10:00 AM EDT by video enabled telemedicine application and verified that I am speaking with the correct person using two identifiers. Pt MRN:  [id number] Pt DOB:  10-29-1985 Video Participants:  Windell Norfolk   History of Present Illness:  The patient was last seen in January 2020 for seizures and headaches. Since his last visit, he called our office in February to report that he had 7 seizures back to back on 07/31/2018 and was taken to [facility] in [place]. He was homeless at that time and sleeping 1-2 hours every night for the past week prior. He left AMA and did not want to make changes to his medications. He reported that his hands and left eye were twitching after leaving the hospital. He denied missing medication or alcohol intake. We agreed to increase dose of Zonisamide to 500mg  qhs. He is also on Levetiracetam 500mg  3 tabs BID (1500mg  BID). He denies any side effects on medications. He reports doing well for a month, then last week he had a seizure witnessed by his cousin. He does not recall much except that he was having a headache and back pain, his cousin noticed him lean over with hand shaking for 2 minutes, his cousin told him he fell, he woke  up on the ground with urinary incontinence. He reports being stressed recently with being out of work due to [employer] pandemic. He tries to sleep 7 hours at night but does not always feel rested. He has had this current headache for the past 4 days, he felt nauseated one time. He has occasional dizziness and felt dizzy standing today during testing. He is having a lot of back pain, worse with ambulation. He denies any bowel/bladder dysfunction but has noticed some urinary frequency, and he feels like his headache gets a little better after he urinates. He has baseline vision issues and has not noticed any vision changes, he does notice it is harder to wear his eyeglasses when he has the headaches.   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 He states that at age 31, he was having episodes of slurred speech, falls, recurrent syncope, and headaches, and was found to have the brain tumor. He recalls when he was younger, his head kept turning to one side and he would try to stop it. After the surgery, he did fairly well except for frequent headaches, then had his first seizure at age 33 or 52. He describes seizures as starting with back pain, then he gets a metallic taste in his mouth, his head starts hurting more, then he gets disoriented and has been told he would become unresponsive then proceed to generalized shaking, at times with urinary  incontinence. He lives with his fiancee and brother, and denies any isolated staring spells. He has been told he has seizures in his sleep. After a seizure, he denies any focal weakness, he can hear people around him but has difficulty getting his words out. He also has some nausea. He has fallen twice this year with his seizures, he was in the ER on 09/05/15 and 12/02/15 for seizure, during both ER visits, he would have a witnessed seizure in the ER. With the most recent seizure on  12/02/15, he had ran out of Vimpat. He has been taking Vimpat 100mg  BID since 2015, on Keppra 1500mg  BID since 2011. He recalls taking Depakote and Topamax in the past, with cognitive side effects. He continues to report memory issues, "I can't think too good." He feels his left side is different, with numbness in his left arm and leg. He had cervical fusion surgery and has been told this is the cause of his left-sided symptoms. His last shunt revision was in 1999. 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   He has a history of chronic headaches, with throbbing headaches occurring around twice a week over the frontal and temporal regions, with associated nausea, vomiting, photo/phonophobia. They would last for 1 to 1-1/2 days, he does not take any medication because he feels they have provoked seizures in the past. He reports taking an unrecalled headache preventative medication in [place] which was helpful. He denies any dizziness, bowel/bladder dysfunction, no myoclonic jerks. He has occasional twitching in an arm or leg. He has chronic neck and back pain.   Epilepsy Risk Factors:  His father had generalized convulsions, his mother had seizures as well. He has a history of benign brain tumor s/p VP shunt, last revision in 1999. He reports several head injuries with loss of consciousness, most recently when hit in the face by a ball a few years ago. Otherwise he had a normal birth and early development.  There is no history of febrile convulsions, CNS infections such as meningitis/encephalitis.  Prior AEDs: Depakote, Topamax, Vimpat  Diagnostic Data: MRI brain with and without contrast 07/27/2016: no acute changes seen. There were stable postsurgical changes in the posterior fossa related to resection of previous mass. 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 No recurrent mass or abnormal enhancement seen. There is a  right transfrontal ventricular shunt seen terminating within the right ventricle. Hippocami symmetric.  MRI cervical spine in 07/2017 showed moderate stenosis at C3-4, moderate foraminal encroachment right greater than left at C3-4, no change from prior MRI. There is moderate right foraminal narrowing at C4-5 and mild left foraminal narrowing at C4-5, unchanged. 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    Observations/Objective:  He is awake, alert, oriented x 3. No aphasia or dysarthria. Intact fluency and comprehension, but slow to respond to some questions, which is his baseline. Cranial nerves: Pupils equal. Extraocular movements intact. 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 No facial asymmetry. Motor: moves all extremities symmetrically, no pronator drift. Gait is slow and cautious reporting back pain, no ataxia noted. He reported feeling dizzy after walking.  Assessment and Plan:   This is a pleasant 33 yo RH man with a history of benign brain tumor resection  s/p VP shunt at age 39, chronic neck and back pain, migraines, and seizures since age 50 suggestive of focal to bilateral tonic-clonic seizures. EEG normal, MRI brain in 06/2016 showed no acute changes with postsurgical posterior fossa changes seen, right transfrontal shunt in place. Longest seizure-free interval is a little over a year. He has had several seizures this year, most recently last week. He is under a lot of stress which may be triggering seizures. He is also reporting a migraine for the past 4 days. We discussed options to increase Zonisamide versus increasing nortriptyline for the headaches. He would like to increase nortriptyline for now, increase to 100mg  qhs, side effects discussed. He will be given a prescription for a course of prednisone  to hopefully break the headache cycle, and maybe help with back pain as well. Continue Levetiracetam 1500mg  BID and Zonisamide 500mg  qhs, we discussed potentially increasing to 600mg  qhs if seizures continue. He does not drive. He will follow-up in 6 months and knows to call for any changes.   Follow Up Instructions:   -I discussed the assessment and treatment plan with the patient. The patient was provided an opportunity to ask questions and all were answered. The patient agreed with the plan and demonstrated an understanding of the instructions.   The patient was advised to call back or seek an in-person evaluation if the symptoms worsen or if the condition fails to improve as anticipated.   Total Time spent in visit with the patient was 25 minutes, of which more than 50% of the time was spent in counseling and/or coordinating care on the above.   Pt understands and agrees with the plan of care outlined.     Cameron Sprang, MD

## 2018-10-13 ENCOUNTER — Ambulatory Visit: Payer: Medicaid Other | Admitting: Neurology

## 2018-10-27 ENCOUNTER — Ambulatory Visit: Payer: Medicaid Other | Admitting: Neurology

## 2019-02-07 ENCOUNTER — Telehealth: Payer: Self-pay | Admitting: Neurology

## 2019-02-07 DIAGNOSIS — G40201 Localization-related (focal) (partial) symptomatic epilepsy and epileptic syndromes with complex partial seizures, not intractable, with status epilepticus: Secondary | ICD-10-CM

## 2019-02-07 NOTE — Telephone Encounter (Signed)
Pls have him do bloodwork for CBC, CMP. Pls let him know that service dogs are not easy to get by, but if he has paperwork we need to fill out, we can do that for him. Thanks

## 2019-02-07 NOTE — Telephone Encounter (Signed)
Patient left msg with after hours about experiencing dizzy spells for a few days. He is better today but has a hx of seizures. On call told him to see PCP. Thanks!

## 2019-02-07 NOTE — Telephone Encounter (Signed)
Patient is calling in about wanting to speak with a nurse regarding dizzy spells. Please call him at [phone]. Thanks!

## 2019-02-07 NOTE — Telephone Encounter (Signed)
  Patient has had "dizzy spells" for a few days. Today is better than past few days. Patient stated on call nurse last night encouraged drinking a lot of fluids and he said that has helped some today but still feels weak/unsteady. Feels a little dizzy. 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   Patient has had no change in medications (verified taking all prescribed from Dr. Delice Lesch: Keppra 500 mg 3 tabs BID; Nortiptylline 50 mg 2 @HS ; Zonegran 100 mg 5 tabs @HS ).  No seizures. No sleep deprivation. Last alcohol intake was last Thursday (beer).  I informed patient I would let Dr. Delice Lesch know above and call him back if any recommendations.  Patient also said he wants to get a service dog (to help him due to his vision/seizures)  and said he needs a letter from his MD. I asked him to find out exactly what documentation he needs and call back - verbalized understanding.

## 2019-02-08 NOTE — Telephone Encounter (Signed)
Order for CBC/CMP entered and called patient and he is aware to come to our office and check in prior to going to lab. Told patient he can come whenever is convenient for him the next few days (informed patient lab is closed for lunch 1215-1330 and to come either before or after those times). Verbalized understanding.   Also shared with him Dr. Delice Lesch said if he has paperwork for her to fill out regarding a service dog he can bring that.

## 2019-02-10 ENCOUNTER — Encounter (HOSPITAL_COMMUNITY): Payer: Self-pay

## 2019-02-10 ENCOUNTER — Ambulatory Visit (HOSPITAL_COMMUNITY)
Admission: EM | Admit: 2019-02-10 | Discharge: 2019-02-10 | Disposition: A | Discharge status: Home / Self Care | Payer: Medicaid Other | Attending: Emergency Medicine | Admitting: Emergency Medicine

## 2019-02-10 ENCOUNTER — Other Ambulatory Visit: Payer: Self-pay

## 2019-02-10 DIAGNOSIS — G43809 Other migraine, not intractable, without status migrainosus: Secondary | ICD-10-CM | POA: Diagnosis present

## 2019-02-10 DIAGNOSIS — M5431 Sciatica, right side: Secondary | ICD-10-CM | POA: Insufficient documentation

## 2019-02-10 LAB — COMPREHENSIVE METABOLIC PANEL
ALT: 40 U/L (ref 0–44)
AST: 33 U/L (ref 15–41)
Albumin: 4.4 g/dL (ref 3.5–5.0)
Alkaline Phosphatase: 48 U/L (ref 38–126)
Anion gap: 10 (ref 5–15)
BUN: 13 mg/dL (ref 6–20)
CO2: 25 mmol/L (ref 22–32)
Calcium: 9.8 mg/dL (ref 8.9–10.3)
Chloride: 104 mmol/L (ref 98–111)
Creatinine, Ser: 1.65 mg/dL — ABNORMAL HIGH (ref 0.61–1.24)
GFR calc Af Amer: >60 mL/min (ref >60)
GFR calc non Af Amer: 53 mL/min — ABNORMAL LOW (ref >60)
Glucose, Bld: 100 mg/dL — ABNORMAL HIGH (ref 70–99)
Potassium: 4.6 mmol/L (ref 3.5–5.1)
Sodium: 139 mmol/L (ref 135–145)
Total Bilirubin: 0.8 mg/dL (ref 0.3–1.2)
Total Protein: 8.3 g/dL — ABNORMAL HIGH (ref 6.5–8.1)

## 2019-02-10 LAB — CBC
HCT: 46.6 % (ref 39.0–52.0)
Hemoglobin: 15.1 g/dL (ref 13.0–17.0)
MCH: 26.7 pg (ref 26.0–34.0)
MCHC: 32.4 g/dL (ref 30.0–36.0)
MCV: 82.3 fL (ref 80.0–100.0)
Platelets: 227 10*3/uL (ref 150–400)
RBC: 5.66 MIL/uL (ref 4.22–5.81)
RDW: 13.3 % (ref 11.5–15.5)
WBC: 6.4 10*3/uL (ref 4.0–10.5)
nRBC: 0 % (ref 0.0–0.2)

## 2019-02-10 MED ORDER — DEXAMETHASONE SODIUM PHOSPHATE 10 MG/ML IJ SOLN
INTRAMUSCULAR | Status: AC
  Filled 2019-02-10: qty 1

## 2019-02-10 MED ORDER — DIPHENHYDRAMINE HCL 25 MG PO CAPS
ORAL_CAPSULE | ORAL | Status: AC
  Filled 2019-02-10: qty 1

## 2019-02-10 MED ORDER — KETOROLAC TROMETHAMINE 60 MG/2ML IM SOLN
60.0000 mg | Freq: Once | INTRAMUSCULAR | Status: AC
  Administered 2019-02-10: 60 mg via INTRAMUSCULAR

## 2019-02-10 MED ORDER — KETOROLAC TROMETHAMINE 60 MG/2ML IM SOLN
INTRAMUSCULAR | Status: AC
  Filled 2019-02-10: qty 2

## 2019-02-10 MED ORDER — DEXAMETHASONE SODIUM PHOSPHATE 10 MG/ML IJ SOLN
10.0000 mg | Freq: Once | INTRAMUSCULAR | Status: AC
  Administered 2019-02-10: 10 mg via INTRAMUSCULAR

## 2019-02-10 MED ORDER — DIPHENHYDRAMINE HCL 25 MG PO CAPS
25.0000 mg | ORAL_CAPSULE | Freq: Once | ORAL | Status: AC
  Administered 2019-02-10: 25 mg via ORAL

## 2019-02-10 NOTE — ED Provider Notes (Addendum)
[place]    CSN: [id number] Arrival date & time: 02/10/19  1602      History   Chief Complaint Chief Complaint  Patient presents with  . Generalized Body Aches    HPI Don Reed is a 33 y.o. male.   Don Reed presents with complaints of headache, right low back pain and dizziness. States headache and dizziness feel similar to other migraines he has had. History of migraines. This started approximately 5 days ago. No known back or neck injury. He has has cervical spinal fusion, brain tumor removal and ventricular stunt placement; hydrocephalus, seizures, vision loss and chronic back pain. He takes nortriptyline for his chronic migraines. Has tried tylenol additionally which hasn't helped. His dizziness has improved since it's onset. Pain is worse with activity and when upright, also can be worse with bending. Light and sound worsens headache as well. Vision feels at baseline when at rest. Pain 10/10 at its worse. His right low back pain radiates to his foot. 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 No saddle symptoms loss of bladder or bowel.  State he has has similar episode in the past which required his cervical spinal fusion. He does follow with neurology.     ROS per HPI, negative if not otherwise mentioned.      Past Medical History:  Diagnosis Date  . Asthma   . Cancer ([place])   . Chronic back pain   . Hydrocephalus ([place])   . Legally blind   . Migraine   . Seizure ([place])   . Seizures ([place])   . Vision loss     Patient Active Problem List   Diagnosis Date Noted  . Unintentional weight loss 11/02/2017  . Localization-related (focal) (partial) symptomatic epilepsy and epileptic syndromes with complex partial seizures, not intractable, without status epilepticus ([place]) 02/14/2016  . Migraine without aura and without status migrainosus, not intractable 02/14/2016  . Generalized seizure disorder ([place]) 04/06/2014  . Brain  tumor (benign) ([place]) 04/06/2014  . S/P ventricular shunt placement 04/06/2014  . S/P cervical spinal fusion 04/06/2014  . Neck pain 04/06/2014    Past Surgical History:  Procedure Laterality Date  . BRAIN SURGERY  age 27   tumor  . SPINAL FUSION  2011       Home Medications    Prior to Admission medications   Medication Sig Start Date End Date Taking? Authorizing Provider  levETIRAcetam (KEPPRA) 500 MG tablet Take 3 tabs twice a day 10/04/18  Yes Cameron Sprang, MD  nortriptyline [facility]) 50 MG capsule Take 2 capsules every night 10/04/18  Yes Cameron Sprang, MD  zonisamide [facility]) 100 MG capsule Take 5 capsules at night 10/04/18  Yes Cameron Sprang, MD  acetaminophen (TYLENOL) 500 MG tablet Take 500-1,000 mg by mouth every 8 (eight) hours as needed (FOR PAIN OR HEADACHES).    [provider]  diphenhydramine-acetaminophen (TYLENOL PM) 25-500 MG TABS tablet Take 2 tablets by mouth at bedtime as needed (for sleep).     [provider]  ondansetron (ZOFRAN ODT) 4 MG disintegrating tablet Take 1 tablet (4 mg total) by mouth every 8 (eight) hours as needed for nausea or vomiting. Patient not taking: Reported on 10/04/2018 02/21/17   Lorin Glass, PA-C  predniSONE (DELTASONE) 20 MG tablet Take 3 tabs on day 1, 2-1/2 tabs on day 2, 2 tabs on day 3, 1-1/2 tabs on day 4, 1 tab on day 5, 1/2 tab on  days 6 and 7, then stop. 10/04/18   Cameron Sprang, MD  PROVENTIL HFA 108 (90 BASE) MCG/ACT inhaler Inhale 2 puffs into the lungs every 6 (six) hours as needed for shortness of breath.  11/13/14   [provider]    Family History Family History  Problem Relation Age of Onset  . Hypertension Mother   . Seizures Mother   . Cataracts Mother   . Stroke Mother   . Stroke Father   . Seizures Father   . Scoliosis Sister   . Vision loss Sister   . Vision loss Brother   . Diabetes Brother   . Leukemia Brother     Social History Social History   Tobacco Use  .  Smoking status: Never Smoker  . Smokeless tobacco: Never Used  Substance Use Topics  . Alcohol use: No  . Drug use: No     Allergies   Amoxicillin, Shellfish allergy, and Vancomycin   Review of Systems Review of Systems   Physical Exam Triage Vital Signs ED Triage Vitals  Enc Vitals Group     BP 02/10/19 1640 92/63     Pulse Rate 02/10/19 1640 94     Resp 02/10/19 1640 19     Temp 02/10/19 1640 98.7 F (37.1 C)     Temp Source 02/10/19 1640 Oral     SpO2 02/10/19 1640 100 %     Weight 02/10/19 1638 210 lb (95.3 kg)     Height --      Head Circumference --      Peak Flow --      Pain Score 02/10/19 1638 10     Pain Loc --      Pain Edu? --      Excl. in [place]? --    No data found.  Updated Vital Signs BP 112/71   Pulse 94   Temp 98.7 F (37.1 C) (Oral)   Resp 19   Wt 210 lb (95.3 kg)   SpO2 100%   BMI 27.71 kg/m    Physical Exam Constitutional:      Appearance: He is well-developed. He is not toxic-appearing.     Comments: Patient is leaning back on cart for comfort   Eyes:     General: Lids are normal.     Extraocular Movements: Extraocular movements intact.     Right eye: Nystagmus present.     Left eye: Nystagmus present.     Comments: Pupils round and reactive   Cardiovascular:     Rate and Rhythm: Normal rate.  Pulmonary:     Effort: Pulmonary effort is normal.  Musculoskeletal:     Lumbar back: He exhibits tenderness.       Back:     Comments: Pain with straight leg raise; gross sensation intact, strength equal   Skin:    General: Skin is warm and dry.  Neurological:     Mental Status: He is alert and oriented to person, place, and time.      UC Treatments / Results  Labs (all labs ordered are listed, but only abnormal results are displayed) Labs Reviewed  CBC  COMPREHENSIVE METABOLIC PANEL    EKG   Radiology No results found.  Procedures Procedures (including critical care time)  Medications Ordered in UC Medications   ketorolac (TORADOL) injection 60 mg (has no administration in time range)  dexamethasone (DECADRON) injection 10 mg (has no administration in time range)  diphenhydrAMINE (BENADRYL) capsule 25 mg (has no  administration in time range)    Initial Impression / Assessment and Plan / UC Course  I have reviewed the triage vital signs and the nursing notes.  Pertinent labs & imaging results that were available during my care of the patient were reviewed by me and considered in my medical decision making (see chart for details).     Vitals stable. No injury to back or neck. Afebrile. Symptoms consistent with migraine, likely sciatica as well. Decadron, toradol and benadryl provided. Encouraged rest, fluids. Basic labs pending as well. Will notify of any positive findings and if any changes to treatment are needed.  Strict er precautions provided. To continue to follow with neurology. Patient verbalized understanding and agreeable to plan.   Final Clinical Impressions(s) / UC Diagnoses   Final diagnoses:  Other migraine without status migrainosus, not intractable  Sciatica of right side     Discharge Instructions     May use tylenol as needed .  Will notify of any positive findings from your lab tests and if any changes to treatment are needed.  You may monitor your results on your MyChart online as well.   Drink plenty of fluids.  If any symptoms persist, or worsen in any way, please go to the ER.  Continue to follow up with your neurologist.    ED Prescriptions    None     Controlled Substance Prescriptions  Controlled Substance Registry consulted? Not Applicable   Zigmund Gottron, NP 02/10/19 1747    Zigmund Gottron, NP 02/10/19 1747

## 2019-02-10 NOTE — ED Triage Notes (Addendum)
Pt here for generalized body aches for 1 week and complains of dizziness. Unsteady on his feet. Covid test was negative yesterday. Also complains of headache and does have a shunt in his brain due to a childhood tumor.

## 2019-02-10 NOTE — Discharge Instructions (Signed)
May use tylenol as needed .  Will notify of any positive findings from your lab tests and if any changes to treatment are needed.  You may monitor your results on your MyChart online as well.   Drink plenty of fluids.  If any symptoms persist, or worsen in any way, please go to the ER.  Continue to follow up with your neurologist.

## 2019-02-14 ENCOUNTER — Telehealth: Payer: Self-pay | Admitting: Neurology

## 2019-02-14 NOTE — Telephone Encounter (Signed)
Pt is feeling better today.  States that he works at [employer] and he believes that he got to hot while working.  Pt would like to start working again at [employer] for the [employer]. He is requesting a letter be faxed to them with his restrictions and how many days per week he can work. 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  Fax number [phone]

## 2019-02-14 NOTE — Telephone Encounter (Signed)
Left message with after hour service on 02-14-19 @ 12:09 pm   Caller states that he went to UC on Thursday and had lab work done. He is wanting the test results and also had a seizure yesterday

## 2019-02-15 ENCOUNTER — Telehealth (HOSPITAL_COMMUNITY): Payer: Self-pay | Admitting: Emergency Medicine

## 2019-02-15 NOTE — Telephone Encounter (Signed)
Pt contacted about blood work, all questions answered, will increase fluid intake.

## 2019-03-01 NOTE — Telephone Encounter (Signed)
Patient called to check on the status of the requested letter. 

## 2019-03-07 ENCOUNTER — Encounter: Payer: Self-pay | Admitting: Neurology

## 2019-03-07 NOTE — Telephone Encounter (Signed)
Letter mailed to pts home address.  

## 2019-03-07 NOTE — Telephone Encounter (Signed)
Letter ready for printing and signing. Thanks

## 2019-03-15 ENCOUNTER — Telehealth: Payer: Self-pay | Admitting: Neurology

## 2019-03-15 NOTE — Telephone Encounter (Signed)
Pt states that he had a seizure today. The EMT were called and he did not go with them because he had a Dr Appt today

## 2019-03-16 NOTE — Telephone Encounter (Signed)
Pt is resting today. Stayed out of work. Starting to get back to his normal self. Just sleepy.  Seizure lasted about 4 minutes per GF.  Pt states that he has not missed any doses of his Keppra and Zonisamide.  Pt c/o a lot of HA. Pt has been taking Goody Powders.  Pt does not drink alcohol.   Instructed pt limit use of good powders to no more than twice per week due rebound HA.  He will call back if HA do not improve after limiting use of goody powders.

## 2019-04-11 ENCOUNTER — Ambulatory Visit: Payer: Medicaid Other | Admitting: Neurology

## 2019-04-25 ENCOUNTER — Telehealth: Payer: Self-pay | Admitting: Neurology

## 2019-04-25 NOTE — Telephone Encounter (Signed)
Patient called and requested a letter to be faxed for a job he is applying for at [employer]. He said they are aware of his history of seizures. The shift he is interested in is three days a week from 7:30 AM to 4:30 PM.  Fax: [phone]

## 2019-04-26 NOTE — Telephone Encounter (Signed)
Letter faxed.

## 2019-05-09 ENCOUNTER — Telehealth: Payer: Self-pay | Admitting: Neurology

## 2019-05-09 NOTE — Telephone Encounter (Signed)
Patient called and said he reached out to his primary care doctor about his back pain and sciatica pain but was told to call Dr. Delice Lesch and request assistance. He said he needs a referral to a neurosurgeon to help him with his issues.

## 2019-05-10 NOTE — Telephone Encounter (Signed)
Dr. Delice Lesch,  Do you need to see the pt in the office first?

## 2019-05-10 NOTE — Telephone Encounter (Signed)
Pt has previously been seen by neurosurgery in 2018 (scanned by Dr. Christella Noa).  Since he appears to be an established patient there, he should be able to call there for a follow up appt

## 2019-05-10 NOTE — Telephone Encounter (Signed)
Pt given Dr. Lacy Duverney number and will call them for an appt [phone]. [facility].

## 2019-07-31 ENCOUNTER — Telehealth (INDEPENDENT_AMBULATORY_CARE_PROVIDER_SITE_OTHER): Payer: Medicaid Other | Admitting: Neurology

## 2019-07-31 ENCOUNTER — Other Ambulatory Visit: Payer: Self-pay

## 2019-07-31 DIAGNOSIS — Z5329 Procedure and treatment not carried out because of patient's decision for other reasons: Secondary | ICD-10-CM

## 2019-07-31 NOTE — Progress Notes (Signed)
Sent text link twice and called patient 3 times with no answer, left voicemail to reschedule.

## 2019-08-01 ENCOUNTER — Telehealth (INDEPENDENT_AMBULATORY_CARE_PROVIDER_SITE_OTHER): Payer: Medicaid Other | Admitting: Neurology

## 2019-08-01 ENCOUNTER — Encounter: Payer: Self-pay | Admitting: Neurology

## 2019-08-01 DIAGNOSIS — G40201 Localization-related (focal) (partial) symptomatic epilepsy and epileptic syndromes with complex partial seizures, not intractable, with status epilepticus: Secondary | ICD-10-CM | POA: Diagnosis not present

## 2019-08-01 DIAGNOSIS — M5441 Lumbago with sciatica, right side: Secondary | ICD-10-CM

## 2019-08-01 DIAGNOSIS — G43009 Migraine without aura, not intractable, without status migrainosus: Secondary | ICD-10-CM | POA: Diagnosis not present

## 2019-08-01 NOTE — Progress Notes (Signed)
Virtual Visit via Video Note The purpose of this virtual visit is to provide medical care while limiting exposure to the novel coronavirus.    Consent was obtained for video visit:  Yes.   Answered questions that patient had about telehealth interaction:  Yes.   I discussed the limitations, risks, security and privacy concerns of performing an evaluation and management service by telemedicine. I also discussed with the patient that there may be a patient responsible charge related to this service. The patient expressed understanding and agreed to proceed.  Pt location: Home Physician Location: office Name of referring provider:  Lucianne Lei, MD I connected with Don Reed at patients initiation/request on 08/01/2019 at  9:00 AM EST by video enabled telemedicine application and verified that I am speaking with the correct person using two identifiers. Pt MRN:  [id number] Pt DOB:  [birth year] Video Participants:  Don Reed   History of Present Illness:  The patient had a virtual video visit on 08/01/2019. He was last seen 10 months ago for seizures and headaches. Since his last visit, he called in August to report dizzy spells. He called our office about seizures in August and September. He denies any seizures since 03/15/2019. He is taking Zonisamide 500mg  qhs and Levetiracetam 500mg  3 tabs BID (1500mg  BID) without side effects. No further dizzy spells. The migraines have been quiet as well, he is tolerating nortriptyline 100mg  qhs without side effects. His main concern today is back pain with shooting pain radiating down his right leg. No numbness/tingling/weakness, no bowel/bladder dysfunction. He had an interview with the [employer] last week and awaits their response, he is still working at [employer]. He does not drive.   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 He states  that at age 34, he was having episodes of slurred speech, falls, recurrent syncope, and headaches, and was found to have the brain tumor. He recalls when he was younger, his head kept turning to one side and he would try to stop it. After the surgery, he did fairly well except for frequent headaches, then had his first seizure at age 34 or 44. He describes seizures as starting with back pain, then he gets a metallic taste in his mouth, his head starts hurting more, then he gets disoriented and has been told he would become unresponsive then proceed to generalized shaking, at times with urinary incontinence. He lives with his fiancee and brother, and denies any isolated staring spells. He has been told he has seizures in his sleep. After a seizure, he denies any focal weakness, he can hear people around him but has difficulty getting his words out. He also has some nausea. He has fallen twice this year with his seizures, he was in the ER on 09/05/15 and 12/02/15 for seizure, during both ER visits, he would have a witnessed seizure in the ER. With the most recent seizure on 12/02/15, he had ran out of Vimpat. He has been taking Vimpat 100mg  BID since 2015, on Keppra 1500mg  BID since 2011. He recalls taking Depakote and Topamax in the past, with cognitive side effects. He continues to report memory issues, "I can't think too good." He feels his left side is different, with numbness in his left arm and leg. He had cervical fusion surgery and has been told this is the cause of his  left-sided symptoms. His last shunt revision was in 1999. 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   He has a history of chronic headaches, with throbbing headaches occurring around twice a week over the frontal and temporal regions, with associated nausea, vomiting, photo/phonophobia. They would last for 1 to 1-1/2 days, he does not take any medication because he feels they have provoked seizures in the past. He reports taking an  unrecalled headache preventative medication in [place] which was helpful. He denies any dizziness, bowel/bladder dysfunction, no myoclonic jerks. He has occasional twitching in an arm or leg. He has chronic neck and back pain.   Epilepsy Risk Factors:  His father had generalized convulsions, his mother had seizures as well. He has a history of benign brain tumor s/p VP shunt, last revision in 1999. He reports several head injuries with loss of consciousness, most recently when hit in the face by a ball a few years ago. Otherwise he had a normal birth and early development.  There is no history of febrile convulsions, CNS infections such as meningitis/encephalitis.  Prior AEDs: Depakote, Topamax, Vimpat  Diagnostic Data: MRI brain with and without contrast 07/27/2016: no acute changes seen. There were stable postsurgical changes in the posterior fossa related to resection of previous mass. 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 No recurrent mass or abnormal enhancement seen. There is a right transfrontal ventricular shunt seen terminating within the right ventricle. Hippocami symmetric.  MRI cervical spine in 07/2017 showed moderate stenosis at C3-4, moderate foraminal encroachment right greater than left at C3-4, no change from prior MRI. There is moderate right foraminal narrowing at C4-5 and mild left foraminal narrowing at C4-5, unchanged. 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     Current Outpatient Medications on File Prior to Visit  Medication Sig Dispense Refill  . acetaminophen (TYLENOL) 500 MG tablet Take 500-1,000 mg by mouth every 8 (eight) hours as needed (FOR PAIN OR HEADACHES).    . diphenhydramine-acetaminophen (TYLENOL PM) 25-500 MG TABS tablet Take 2 tablets by mouth at bedtime  as needed (for sleep).     Marland Kitchen levETIRAcetam (KEPPRA) 500 MG tablet Take 3 tabs twice a day 540 tablet 3  . nortriptyline (PAMELOR) 50 MG capsule Take 2 capsules every night 180 capsule 3  . ondansetron (ZOFRAN ODT) 4 MG disintegrating tablet Take 1 tablet (4 mg total) by mouth every 8 (eight) hours as needed for nausea or vomiting. 10 tablet 0  . PROVENTIL HFA 108 (90 BASE) MCG/ACT inhaler Inhale 2 puffs into the lungs every 6 (six) hours as needed for shortness of breath.   3  . zonisamide (ZONEGRAN) 100 MG capsule Take 5 capsules at night 450 capsule 3   No current facility-administered medications on file prior to visit.     Observations/Objective:   GEN:  The patient appears stated age and is in NAD.  Neurological examination: Patient is awake, alert, oriented x 3. No aphasia or dysarthria. Intact fluency and comprehension. Remote and recent memory impaired. Cranial nerves: Extraocular movements intact with baseline nystagmoid eye movements to all directions. No facial asymmetry. Motor: moves all extremities symmetrically, at least anti-gravity x 4. N   Assessment and Plan:   This is a pleasant 33 yo RH man with a history of benign brain tumor resection  s/p VP shunt at age 52, chronic neck and back pain, migraines, and seizures since age 63 suggestive of focal to bilateral tonic-clonic seizures. EEG normal, MRI brain in 06/2016 showed no acute changes with postsurgical posterior fossa changes seen, right transfrontal shunt in place. Longest seizure-free interval is a little over a year. His last seizure was on 03/15/2019. Continue Zonisamide 500mg  qhs and Levetiracetam 1500mg  BID. Migraines controlled on nortriptyline 100mg  qhs. Refills sent. His main concern is back pain with right-sided sciatica, refer to Ortho. He does not drive. He will follow-up in 6 months and knows to call for any changes.    Follow Up Instructions:   -I discussed the assessment and treatment plan with the patient. The  patient was provided an opportunity to ask questions and all were answered. The patient agreed with the plan and demonstrated an understanding of the instructions.   The patient was advised to call back or seek an in-person evaluation if the symptoms worsen or if the condition fails to improve as anticipated.    Cameron Sprang, MD

## 2019-08-08 ENCOUNTER — Ambulatory Visit: Payer: Medicaid Other | Admitting: Orthopaedic Surgery

## 2019-08-15 ENCOUNTER — Ambulatory Visit: Payer: Medicaid Other | Admitting: Orthopaedic Surgery

## 2019-08-23 ENCOUNTER — Ambulatory Visit: Payer: Medicaid Other | Admitting: Orthopaedic Surgery

## 2019-09-06 ENCOUNTER — Ambulatory Visit: Payer: Medicaid Other | Admitting: Orthopaedic Surgery

## 2019-09-06 ENCOUNTER — Telehealth: Payer: Self-pay | Admitting: Neurology

## 2019-09-06 NOTE — Telephone Encounter (Signed)
Pt called back and per Dr Delice Lesch she agrees with his concerns but from a neurological standpoint it is not a letter I would be able to write. He can ask his PCP about this and see what they say

## 2019-09-06 NOTE — Telephone Encounter (Signed)
Pls let him know that I agree with his concerns but from a neurological standpoint it is not a letter I would be able to write. He can ask his PCP about this and see what they say. Thanks

## 2019-09-06 NOTE — Telephone Encounter (Signed)
Needs to speak to someone about getting a letter done for the [employer]  He works at [employer] and they have a new thing that up to 600 ppl come in the building and some do not have mask he wants to be put out of work

## 2019-09-12 ENCOUNTER — Telehealth: Payer: Self-pay | Admitting: Neurology

## 2019-09-12 NOTE — Telephone Encounter (Signed)
Patient called in regarding Dr. Criss Rosales advising him to have a letter written from Dr. Delice Lesch regarding [place] needing to find another job maybe working from home. He said at [employer] the max occupancy increased from 300 to now 600. He feels because of his Seizure disorder that he does not need to be around that many people due to the COVID virus. He asked if the letter could be written and he would pick it up and take it to [employer]. Please advise. Thank you

## 2019-09-13 ENCOUNTER — Encounter: Payer: Self-pay | Admitting: Neurology

## 2019-09-13 NOTE — Telephone Encounter (Signed)
Pt informed that Dr.Aquino  cannot write a note that he cannot work, but she has written a note regarding seizures and his concerns. Pt informed letter is at the front and he can come pick it up

## 2019-09-13 NOTE — Telephone Encounter (Signed)
Pls let him know I cannot write a note that he cannot work, but I have written a note regarding seizures and his concerns. Thanks

## 2019-09-13 NOTE — Telephone Encounter (Signed)
Patient called to check on the status of the requested letter.

## 2019-09-20 ENCOUNTER — Ambulatory Visit: Payer: Medicaid Other | Admitting: Orthopaedic Surgery

## 2019-09-20 NOTE — Progress Notes (Signed)
Thanks for trusting the care of your patient to our office. We would love to schedule an appointment, however patient was a NO SHOW again for their appointment on 09/20/19. Per Dr. Lorin Mercy patient can not be reschedule due to the two NO SHOW appointments.

## 2019-09-26 ENCOUNTER — Telehealth: Payer: Self-pay | Admitting: Neurology

## 2019-09-26 NOTE — Telephone Encounter (Signed)
Pls ask about any seizure triggers, did he miss any medication, sleep deprivation, alcohol, infection? Who is recommending an MRI, pls request notes from doctor requesting this. Thanks

## 2019-09-26 NOTE — Telephone Encounter (Signed)
Patient called to report he had a seizure in the bathroom at [facility]. He also said hit his head and bruised his arm as well as urinate on himself during the incident.  Patient is requesting an appointment with a neurosurgeon, Dr. Larose Hires (? Sp). He said they recommended an MRI to be ordered by Dr. Delice Lesch

## 2019-09-27 NOTE — Telephone Encounter (Signed)
Pt c/o: seizure Missed medications?  Yes.    Sleep deprived?  No. Alcohol intake?  Yes.   Back to their usual baseline self?  Yes.  . If no, advise go to ER.  Increase stress due to family death.  Advised patient that if he does not feel like himself to go to ER. Current medications prescribed by Dr. Delice Lesch: Don Reed

## 2019-10-30 ENCOUNTER — Telehealth: Payer: Self-pay | Admitting: Neurology

## 2019-10-30 ENCOUNTER — Other Ambulatory Visit: Payer: Self-pay

## 2019-10-30 DIAGNOSIS — G40201 Localization-related (focal) (partial) symptomatic epilepsy and epileptic syndromes with complex partial seizures, not intractable, with status epilepticus: Secondary | ICD-10-CM

## 2019-10-30 DIAGNOSIS — G43001 Migraine without aura, not intractable, with status migrainosus: Secondary | ICD-10-CM

## 2019-10-30 MED ORDER — ZONISAMIDE 100 MG PO CAPS: ORAL_CAPSULE | ORAL | 3 refills | Status: DC

## 2019-10-30 MED ORDER — NORTRIPTYLINE HCL 50 MG PO CAPS: ORAL_CAPSULE | ORAL | 3 refills | Status: DC

## 2019-10-30 MED ORDER — LEVETIRACETAM 500 MG PO TABS: ORAL_TABLET | ORAL | 3 refills | Status: DC

## 2019-10-30 NOTE — Telephone Encounter (Signed)
Patient called and said he has moved and has a new pharmacy listed below. He said he could not transfer the medications because there are not any refills left.   Patient stated he needs the seizure medication as soon as possible because he does not have any left.  Patient needs refills on three medications: Keppra, zonisamide, and nortriptyline.  [facility] on [address]

## 2019-11-17 ENCOUNTER — Ambulatory Visit: Payer: Medicaid Other | Admitting: Neurology

## 2019-11-22 ENCOUNTER — Encounter: Payer: Self-pay | Admitting: Neurology

## 2019-11-22 ENCOUNTER — Telehealth: Payer: Self-pay | Admitting: Neurology

## 2019-11-22 NOTE — Telephone Encounter (Signed)
Done, thanks

## 2019-11-22 NOTE — Telephone Encounter (Signed)
Patient called in wanting a letter from Dr. Delice Lesch. He said he currently works 3 days a week at [employer] and has not been working due to [employer]. 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 He would need a letter stating it is medically OK for him to return to work. He wants to come pick the letter up once it is ready.

## 2019-11-23 NOTE — Telephone Encounter (Signed)
Pt called he will have someone come by and pick up his work note.,

## 2019-12-04 ENCOUNTER — Ambulatory Visit (HOSPITAL_COMMUNITY)
Admission: EM | Admit: 2019-12-04 | Discharge: 2019-12-04 | Disposition: A | Discharge status: Home / Self Care | Payer: Medicaid Other | Attending: Family Medicine | Admitting: Family Medicine

## 2019-12-04 ENCOUNTER — Other Ambulatory Visit: Payer: Self-pay

## 2019-12-04 DIAGNOSIS — G43009 Migraine without aura, not intractable, without status migrainosus: Secondary | ICD-10-CM | POA: Diagnosis not present

## 2019-12-04 DIAGNOSIS — Z981 Arthrodesis status: Secondary | ICD-10-CM | POA: Diagnosis not present

## 2019-12-04 DIAGNOSIS — G40209 Localization-related (focal) (partial) symptomatic epilepsy and epileptic syndromes with complex partial seizures, not intractable, without status epilepticus: Secondary | ICD-10-CM | POA: Diagnosis not present

## 2019-12-04 DIAGNOSIS — Z820 Family history of epilepsy and other diseases of the nervous system: Secondary | ICD-10-CM | POA: Insufficient documentation

## 2019-12-04 DIAGNOSIS — Z79899 Other long term (current) drug therapy: Secondary | ICD-10-CM | POA: Insufficient documentation

## 2019-12-04 DIAGNOSIS — Z880 Allergy status to penicillin: Secondary | ICD-10-CM | POA: Diagnosis not present

## 2019-12-04 DIAGNOSIS — R05 Cough: Secondary | ICD-10-CM | POA: Diagnosis present

## 2019-12-04 DIAGNOSIS — J45909 Unspecified asthma, uncomplicated: Secondary | ICD-10-CM | POA: Insufficient documentation

## 2019-12-04 DIAGNOSIS — R059 Cough, unspecified: Secondary | ICD-10-CM

## 2019-12-04 DIAGNOSIS — Z881 Allergy status to other antibiotic agents status: Secondary | ICD-10-CM | POA: Diagnosis not present

## 2019-12-04 DIAGNOSIS — Z982 Presence of cerebrospinal fluid drainage device: Secondary | ICD-10-CM | POA: Insufficient documentation

## 2019-12-04 DIAGNOSIS — Z86011 Personal history of benign neoplasm of the brain: Secondary | ICD-10-CM | POA: Diagnosis not present

## 2019-12-04 DIAGNOSIS — Z20822 Contact with and (suspected) exposure to covid-19: Secondary | ICD-10-CM | POA: Diagnosis not present

## 2019-12-04 LAB — CBC WITH DIFFERENTIAL/PLATELET
Abs Immature Granulocytes: 0.03 10*3/uL (ref 0.00–0.07)
Basophils Absolute: 0 10*3/uL (ref 0.0–0.1)
Basophils Relative: 0 %
Eosinophils Absolute: 0.1 10*3/uL (ref 0.0–0.5)
Eosinophils Relative: 1 %
HCT: 40.2 % (ref 39.0–52.0)
Hemoglobin: 12.9 g/dL — ABNORMAL LOW (ref 13.0–17.0)
Immature Granulocytes: 0 %
Lymphocytes Relative: 19 %
Lymphs Abs: 2 10*3/uL (ref 0.7–4.0)
MCH: 26.1 pg (ref 26.0–34.0)
MCHC: 32.1 g/dL (ref 30.0–36.0)
MCV: 81.2 fL (ref 80.0–100.0)
Monocytes Absolute: 1.1 10*3/uL — ABNORMAL HIGH (ref 0.1–1.0)
Monocytes Relative: 11 %
Neutro Abs: 6.9 10*3/uL (ref 1.7–7.7)
Neutrophils Relative %: 69 %
Platelets: 294 10*3/uL (ref 150–400)
RBC: 4.95 MIL/uL (ref 4.22–5.81)
RDW: 12.9 % (ref 11.5–15.5)
WBC: 10.1 10*3/uL (ref 4.0–10.5)
nRBC: 0 % (ref 0.0–0.2)

## 2019-12-04 LAB — COMPREHENSIVE METABOLIC PANEL
ALT: 57 U/L — ABNORMAL HIGH (ref 0–44)
AST: 35 U/L (ref 15–41)
Albumin: 4.2 g/dL (ref 3.5–5.0)
Alkaline Phosphatase: 71 U/L (ref 38–126)
Anion gap: 10 (ref 5–15)
BUN: 9 mg/dL (ref 6–20)
CO2: 20 mmol/L — ABNORMAL LOW (ref 22–32)
Calcium: 9.2 mg/dL (ref 8.9–10.3)
Chloride: 103 mmol/L (ref 98–111)
Creatinine, Ser: 1.6 mg/dL — ABNORMAL HIGH (ref 0.61–1.24)
GFR calc Af Amer: >60 mL/min (ref >60)
GFR calc non Af Amer: 55 mL/min — ABNORMAL LOW (ref >60)
Glucose, Bld: 102 mg/dL — ABNORMAL HIGH (ref 70–99)
Potassium: 4.2 mmol/L (ref 3.5–5.1)
Sodium: 133 mmol/L — ABNORMAL LOW (ref 135–145)
Total Bilirubin: 0.5 mg/dL (ref 0.3–1.2)
Total Protein: 8.4 g/dL — ABNORMAL HIGH (ref 6.5–8.1)

## 2019-12-04 MED ORDER — ACETAMINOPHEN 325 MG PO TABS
650.0000 mg | ORAL_TABLET | Freq: Once | ORAL | Status: AC
  Administered 2019-12-04: 650 mg via ORAL

## 2019-12-04 MED ORDER — ACETAMINOPHEN 325 MG PO TABS
ORAL_TABLET | ORAL | Status: AC
  Filled 2019-12-04: qty 2

## 2019-12-04 NOTE — ED Provider Notes (Signed)
[place]    CSN: [id number] Arrival date & time: 12/04/19  1926      History   Chief Complaint Chief Complaint  Patient presents with  . Cough    HPI Don Reed is a 34 y.o. male.   He is presenting with myalgias malaise and elevated temperature.  This is been ongoing for about a week.  He denies any positive Covid exposure.  He gets tested regularly for Covid has been negative.  Initially had a sore throat but that resolved.  Had a mild cough.  Denies any problems with his shunt.  He has some lightheadedness with walking.  HPI  Past Medical History:  Diagnosis Date  . Asthma   . Cancer ([place])   . Chronic back pain   . Hydrocephalus ([place])   . Legally blind   . Migraine   . Seizure ([place])   . Seizures ([place])   . Vision loss     Patient Active Problem List   Diagnosis Date Noted  . Unintentional weight loss 11/02/2017  . Localization-related (focal) (partial) symptomatic epilepsy and epileptic syndromes with complex partial seizures, not intractable, without status epilepticus ([place]) 02/14/2016  . Migraine without aura and without status migrainosus, not intractable 02/14/2016  . Generalized seizure disorder ([place]) 04/06/2014  . Brain tumor (benign) ([place]) 04/06/2014  . S/P ventricular shunt placement 04/06/2014  . S/P cervical spinal fusion 04/06/2014  . Neck pain 04/06/2014    Past Surgical History:  Procedure Laterality Date  . BRAIN SURGERY  age 72   tumor  . SPINAL FUSION  2011       Home Medications    Prior to Admission medications   Medication Sig Start Date End Date Taking? Authorizing Provider  levETIRAcetam (KEPPRA) 500 MG tablet Take 3 tabs twice a day 10/30/19   Cameron Sprang, MD  nortriptyline [facility]) 50 MG capsule Take 2 capsules every night 10/30/19   Cameron Sprang, MD  ondansetron (ZOFRAN ODT) 4 MG disintegrating tablet Take 1 tablet (4 mg total) by mouth every 8 (eight) hours as needed for nausea or vomiting. 02/21/17    Lorin Glass, PA-C  PROVENTIL HFA 108 (90 BASE) MCG/ACT inhaler Inhale 2 puffs into the lungs every 6 (six) hours as needed for shortness of breath.  11/13/14   [provider]  zonisamide (ZONEGRAN) 100 MG capsule Take 5 capsules at night 10/30/19   Cameron Sprang, MD  diphenhydramine-acetaminophen (TYLENOL PM) 25-500 MG TABS tablet Take 2 tablets by mouth at bedtime as needed (for sleep).   12/04/19  [provider]    Family History Family History  Problem Relation Age of Onset  . Hypertension Mother   . Seizures Mother   . Cataracts Mother   . Stroke Mother   . Stroke Father   . Seizures Father   . Scoliosis Sister   . Vision loss Sister   . Vision loss Brother   . Diabetes Brother   . Leukemia Brother     Social History Social History   Tobacco Use  . Smoking status: Never Smoker  . Smokeless tobacco: Never Used  Substance Use Topics  . Alcohol use: Yes  . Drug use: No     Allergies   Amoxicillin, Shellfish allergy, and Vancomycin   Review of Systems Review of Systems  See HPI  Physical Exam Triage Vital Signs ED Triage Vitals [12/04/19 2021]  Enc Vitals Group     BP  Pulse      Resp      Temp      Temp src      SpO2      Weight      Height      Head Circumference      Peak Flow      Pain Score 0     Pain Loc      Pain Edu?      Excl. in [place]?    No data found.  Updated Vital Signs BP 138/82 (BP Location: Left Arm)   Pulse 90   Temp (!) 100.4 F (38 C) (Oral)   Resp 18   SpO2 98%   Visual Acuity Right Eye Distance:   Left Eye Distance:   Bilateral Distance:    Right Eye Near:   Left Eye Near:    Bilateral Near:     Physical Exam Gen: NAD, alert, cooperative with exam,  ENT: normal lips, normal nasal mucosa, tympanic membranes clear and intact bilaterally, normal oropharynx,  Eye: normal EOM, normal conjunctiva and lids CV:  no edema, +2 pedal pulses, regular rhythm, S1-S2   Resp: no accessory muscle use,  non-labored, clear to auscultation bilaterally, no crackles or wheezes  Skin: no rashes, no areas of induration  Neuro: normal tone, normal sensation to touch Psych:  normal insight, alert and oriented MSK: Normal gait, normal strength     UC Treatments / Results  Labs (all labs ordered are listed, but only abnormal results are displayed) Labs Reviewed  SARS CORONAVIRUS 2 (TAT 6-24 HRS)  CBC WITH DIFFERENTIAL/PLATELET  COMPREHENSIVE METABOLIC PANEL    EKG   Radiology No results found.  Procedures Procedures (including critical care time)  Medications Ordered in UC Medications  acetaminophen (TYLENOL) tablet 650 mg (650 mg Oral Given 12/04/19 2022)    Initial Impression / Assessment and Plan / UC Course  I have reviewed the triage vital signs and the nursing notes.  Pertinent labs & imaging results that were available during my care of the patient were reviewed by me and considered in my medical decision making (see chart for details).     Mr. Seeling is a 34 year old male that is presenting with malaise, elevated temperature and myalgias.  Possible for Covid with his symptoms but he gets tested regularly and has been negative.  No identifying specific source on exam today.  He does have a shunt but exam is reassuring.  Obtain Covid test.  We will test CBC and CMP.  Counseled to seek more immediate care if his symptoms worsen.  He may need a CT scan of his head.  Counseled on supportive care.  Given indications to follow-up return.  Final Clinical Impressions(s) / UC Diagnoses   Final diagnoses:  Cough     Discharge Instructions     Please stay well hydrated  We will call with the positive results  Please take 500 mg of tylenol three times a day  Please try 400-600 mg of ibuprofen three times a day as needed  Please be seen more immediately in the emergency department if your symptoms worsen.     ED Prescriptions    None     PDMP not reviewed this  encounter.   Rosemarie Ax, MD 12/04/19 2127

## 2019-12-04 NOTE — Discharge Instructions (Signed)
Please stay well hydrated  We will call with the positive results  Please take 500 mg of tylenol three times a day  Please try 400-600 mg of ibuprofen three times a day as needed  Please be seen more immediately in the emergency department if your symptoms worsen.

## 2019-12-04 NOTE — ED Triage Notes (Signed)
Pt presents coughing, body aches, and headaches, pain with cough, self endorses fever.

## 2019-12-05 LAB — SARS CORONAVIRUS 2 (TAT 6-24 HRS): SARS Coronavirus 2: NEGATIVE

## 2019-12-21 ENCOUNTER — Telehealth: Payer: Self-pay | Admitting: Neurology

## 2019-12-21 NOTE — Telephone Encounter (Signed)
Patient called stating his work is requesting a letter from Dr. Delice Lesch regarding his ability/limitations for work. Patient is requesting the letter be sent as it was stating he can work 2-3 days/week.

## 2020-01-15 ENCOUNTER — Encounter: Payer: Self-pay | Admitting: Neurology

## 2020-01-15 NOTE — Telephone Encounter (Signed)
Done, thanks

## 2020-01-15 NOTE — Telephone Encounter (Signed)
Pt called no answer voice mail left stating that letter is at front desk for him to pick up .,

## 2020-02-05 ENCOUNTER — Encounter (HOSPITAL_COMMUNITY): Payer: Self-pay

## 2020-02-05 ENCOUNTER — Ambulatory Visit: Payer: Medicaid Other | Admitting: Neurology

## 2020-02-05 ENCOUNTER — Other Ambulatory Visit: Payer: Self-pay

## 2020-02-05 ENCOUNTER — Ambulatory Visit (HOSPITAL_COMMUNITY)
Admission: EM | Admit: 2020-02-05 | Discharge: 2020-02-05 | Disposition: A | Discharge status: Home / Self Care | Payer: Medicaid Other

## 2020-02-05 DIAGNOSIS — S46812A Strain of other muscles, fascia and tendons at shoulder and upper arm level, left arm, initial encounter: Secondary | ICD-10-CM

## 2020-02-05 MED ORDER — LIDOCAINE 4 % EX PTCH: 1.0000 | MEDICATED_PATCH | Freq: Every day | CUTANEOUS | 0 refills | Status: DC

## 2020-02-05 MED ORDER — MELOXICAM 15 MG PO TABS: 15.0000 mg | ORAL_TABLET | Freq: Every day | ORAL | 0 refills | Status: AC

## 2020-02-05 NOTE — ED Provider Notes (Signed)
[place]    CSN: [id number] Arrival date & time: 02/05/20  1625      History   Chief Complaint Chief Complaint  Patient presents with  . Neck Pain    HPI Don Reed is a 34 y.o. male.   Patient presents with left-sided neck and upper back pain.  He reports some radiation down his arm at times.  He does report some twitching in his upper arm from time to time.  Symptoms are about a week ago.  He was lifting some heavy furniture.  Reports the pain is primary in the left trapezius.  Does endorse a history of neck surgery several years ago.  He was doing fine until recently.  Has a neurologist for his seizures and takes Keppra and other medicines for this and has been compliant.  States the twitching is not consistent with his seizures.  No weakness or numbness or tingling in the arm.  No chest pain or shortness of breath.     Past Medical History:  Diagnosis Date  . Asthma   . Cancer ([place])   . Chronic back pain   . Hydrocephalus ([place])   . Legally blind   . Migraine   . Seizure ([place])   . Seizures ([place])   . Vision loss     Patient Active Problem List   Diagnosis Date Noted  . Unintentional weight loss 11/02/2017  . Localization-related (focal) (partial) symptomatic epilepsy and epileptic syndromes with complex partial seizures, not intractable, without status epilepticus ([place]) 02/14/2016  . Migraine without aura and without status migrainosus, not intractable 02/14/2016  . Generalized seizure disorder ([place]) 04/06/2014  . Brain tumor (benign) ([place]) 04/06/2014  . S/P ventricular shunt placement 04/06/2014  . S/P cervical spinal fusion 04/06/2014  . Neck pain 04/06/2014    Past Surgical History:  Procedure Laterality Date  . BRAIN SURGERY  age 54   tumor  . SPINAL FUSION  2011  . VENTRICULO-PERITONEAL SHUNT PLACEMENT / LAPAROSCOPIC INSERTION PERITONEAL CATHETER         Home Medications    Prior to Admission medications   Medication Sig Start  Date End Date Taking? Authorizing Provider  levETIRAcetam (KEPPRA) 500 MG tablet Take 3 tabs twice a day 10/30/19  Yes Cameron Sprang, MD  nortriptyline [facility]) 50 MG capsule Take 2 capsules every night 10/30/19  Yes Cameron Sprang, MD  rosuvastatin (CRESTOR) 10 MG tablet Take 10 mg by mouth daily. 09/18/19  Yes [provider]  zonisamide (ZONEGRAN) 100 MG capsule Take 5 capsules at night 10/30/19  Yes Cameron Sprang, MD  Lidocaine (HM LIDOCAINE PATCH) 4 % PTCH Apply 1 patch topically daily. 02/05/20   Clemens Lachman, Marguerita Beards, PA-C  meloxicam (MOBIC) 15 MG tablet Take 1 tablet (15 mg total) by mouth daily for 14 days. 02/05/20 02/19/20  Hermila Millis, Marguerita Beards, PA-C  ondansetron (ZOFRAN ODT) 4 MG disintegrating tablet Take 1 tablet (4 mg total) by mouth every 8 (eight) hours as needed for nausea or vomiting. 02/21/17   Lorin Glass, PA-C  PROVENTIL HFA 108 (90 BASE) MCG/ACT inhaler Inhale 2 puffs into the lungs every 6 (six) hours as needed for shortness of breath.  11/13/14   [provider]  diphenhydramine-acetaminophen (TYLENOL PM) 25-500 MG TABS tablet Take 2 tablets by mouth at bedtime as needed (for sleep).   12/04/19  [provider]    Family History Family History  Problem Relation Age of Onset  . Hypertension Mother   .  Seizures Mother   . Cataracts Mother   . Stroke Mother   . Stroke Father   . Seizures Father   . Scoliosis Sister   . Vision loss Sister   . Vision loss Brother   . Diabetes Brother   . Leukemia Brother     Social History Social History   Tobacco Use  . Smoking status: Never Smoker  . Smokeless tobacco: Never Used  Vaping Use  . Vaping Use: Never used  Substance Use Topics  . Alcohol use: Yes  . Drug use: No     Allergies   Amoxicillin, Shellfish allergy, and Vancomycin   Review of Systems Review of Systems   Physical Exam Triage Vital Signs ED Triage Vitals  Enc Vitals Group     BP 02/05/20 1837 126/87     Pulse --      Resp --       Temp --      Temp src --      SpO2 02/05/20 1837 100 %     Weight --      Height --      Head Circumference --      Peak Flow --      Pain Score 02/05/20 1840 7     Pain Loc --      Pain Edu? --      Excl. in [place]? --    No data found.  Updated Vital Signs BP 126/87 (BP Location: Right Arm)   Pulse 69   Temp 98.2 F (36.8 C) (Oral)   Resp 18   SpO2 99%   Visual Acuity Right Eye Distance:   Left Eye Distance:   Bilateral Distance:    Right Eye Near:   Left Eye Near:    Bilateral Near:     Physical Exam Vitals and nursing note reviewed.  Constitutional:      General: He is not in acute distress.    Appearance: Normal appearance. He is well-developed. He is not ill-appearing.  HENT:     Head: Normocephalic and atraumatic.  Eyes:     Conjunctiva/sclera: Conjunctivae normal.  Neck:     Comments: No midline tenderness.  Mild left paraspinal tenderness.  Patient has full range of motion however with some pain on the left side with looking right. Cardiovascular:     Rate and Rhythm: Normal rate and regular rhythm.     Heart sounds: No murmur heard.   Pulmonary:     Effort: Pulmonary effort is normal. No respiratory distress.     Breath sounds: Normal breath sounds.  Abdominal:     Palpations: Abdomen is soft.     Tenderness: There is no abdominal tenderness.  Musculoskeletal:     Cervical back: Neck supple.     Comments: Left trapezius musculature with tenderness to palpation.  No significant spasm.  No tenderness of the bony structures of the left shoulder.  No tenderness of the musculature of the left upper arm or lower arm.  No bony tenderness of the elbow or wrist.  Patient has full range of motion of the left upper extremity without issue.  5 out of 5 strength in the upper extremities.  Sensation equal and intact.  Tinel's negative at the elbow.  Skin:    General: Skin is warm and dry.  Neurological:     General: No focal deficit present.     Mental  Status: He is alert and oriented to person, place, and time.  UC Treatments / Results  Labs (all labs ordered are listed, but only abnormal results are displayed) Labs Reviewed - No data to display  EKG   Radiology No results found.  Procedures Procedures (including critical care time)  Medications Ordered in UC Medications - No data to display  Initial Impression / Assessment and Plan / UC Course  I have reviewed the triage vital signs and the nursing notes.  Pertinent labs & imaging results that were available during my care of the patient were reviewed by me and considered in my medical decision making (see chart for details).     #Left trapezius strain Patient is a 34 year old presenting with what is likely a left trapezius strain.  Reassuring exam.  Will trial Mobic and lidocaine patch.  We will have him follow-up with sports medicine.  So encouraged him to follow-up with his neurologist and talk about his symptoms.  Return and emergency part precautions discussed.  Patient verbalized understanding plan of care. Final Clinical Impressions(s) / UC Diagnoses   Final diagnoses:  Strain of left trapezius muscle, initial encounter     Discharge Instructions     Take the mobic daily  Use the patch on your neck  Call the sports medicine group for follow up  You may also follow up with your neurologist      ED Prescriptions    Medication Sig Dispense Auth. Provider   meloxicam (MOBIC) 15 MG tablet Take 1 tablet (15 mg total) by mouth daily for 14 days. 14 tablet Graciemae Delisle, Marguerita Beards, PA-C   Lidocaine (HM LIDOCAINE PATCH) 4 % PTCH Apply 1 patch topically daily. 10 patch Ragina Fenter, Marguerita Beards, PA-C     PDMP not reviewed this encounter.   Purnell Shoemaker, PA-C 02/05/20 2141

## 2020-02-05 NOTE — Discharge Instructions (Addendum)
Take the mobic daily  Use the patch on your neck  Call the sports medicine group for follow up  You may also follow up with your neurologist

## 2020-02-05 NOTE — ED Triage Notes (Signed)
Pt c/o posterior neck pain, left shoulder pain and muscle twitching to left arm for approx 1 week. 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   Denies CP, SOB, numbness to left hand/fingers.

## 2020-02-26 ENCOUNTER — Telehealth: Payer: Self-pay | Admitting: Neurology

## 2020-02-26 NOTE — Telephone Encounter (Signed)
Patient would like to know if Dr. Delice Lesch can write him a letter stating he has to work from home. He is worried about risking exposure to covid. Please call

## 2020-02-28 NOTE — Telephone Encounter (Signed)
Pls ask whom to address letter to, what kind of work will he be doing from home? Thanks

## 2020-02-29 NOTE — Telephone Encounter (Signed)
Called and spoke to patient and he informed me that he doesn't have a job yet but he is currently looking for a "work for home" position. He stated he just wants a general letter to have on hand so he has it when he does find a job. Informed patient I would send this information to Dr. Delice Lesch.

## 2020-03-07 ENCOUNTER — Telehealth: Payer: Self-pay | Admitting: Neurology

## 2020-03-07 NOTE — Telephone Encounter (Signed)
Patient called to inquire on the status of the letter he requested. Please call.

## 2020-03-12 ENCOUNTER — Encounter: Payer: Self-pay | Admitting: Neurology

## 2020-03-12 NOTE — Telephone Encounter (Signed)
Letter done. Pls let him know that if he has not had the COVID vaccine yet, I strongly recommend it for additional protection. Thanks

## 2020-03-12 NOTE — Telephone Encounter (Signed)
See other phone note

## 2020-03-12 NOTE — Telephone Encounter (Signed)
Pt called no answer voice mail left that letter is ready and at the front desk., note about getting covid shot with letter,

## 2020-03-18 ENCOUNTER — Encounter: Payer: Self-pay | Admitting: Internal Medicine

## 2020-03-28 ENCOUNTER — Telehealth: Payer: Medicaid Other | Admitting: Internal Medicine

## 2020-05-14 ENCOUNTER — Telehealth: Payer: Self-pay | Admitting: Neurology

## 2020-05-14 NOTE — Telephone Encounter (Signed)
FYI

## 2020-05-14 NOTE — Telephone Encounter (Signed)
Patient called to let Dr. Delice Lesch know he had his first "Covid-19 Pfizer vaccine about two weeks ago." FYI only.

## 2020-05-27 ENCOUNTER — Telehealth: Payer: Self-pay | Admitting: Neurology

## 2020-05-27 DIAGNOSIS — G40201 Localization-related (focal) (partial) symptomatic epilepsy and epileptic syndromes with complex partial seizures, not intractable, with status epilepticus: Secondary | ICD-10-CM

## 2020-05-27 MED ORDER — ZONISAMIDE 100 MG PO CAPS: ORAL_CAPSULE | ORAL | 2 refills | Status: DC

## 2020-05-27 NOTE — Telephone Encounter (Signed)
I really can't say if the vaccine has aggravated his seizure.  It is still recommended to get the vaccine.  If he hasn't missed any doses or been sleep-deprived, then I would increase zonisamide to 600mg  daily.  He should be aware that as per [place] law, he cannot drive for 6 months after his last seizure.

## 2020-05-27 NOTE — Telephone Encounter (Signed)
Pt called and informed Per Dr Tomi Likens that we really can't say if the vaccine has aggravated his seizure.  It is still recommended to get the vaccine.  If he hasn't missed any doses or been sleep-deprived, then DR Tomi Likens  would increase zonisamide to 600mg  daily.  He should be aware that as per [place] law, he cannot drive for 6 months after his last seizure.

## 2020-05-27 NOTE — Telephone Encounter (Signed)
Patient called in stating he has had 2 seizures. Once on 05/17/20 and the other yesterday 05/26/20. He stated he received his first [place] 19 shot on 05/03/20 and is supposed to have his second tomorrow. He hasn't had a seizure in over a year until he had the one on 05/17/20. He is wondering if the Covid shot may have something to do with it? He would like to know if he should get his second shot tomorrow and if he should change his medication? He would like to hear from someone today to know about his shot for tomorrow.

## 2020-05-27 NOTE — Telephone Encounter (Signed)
Pt c/o: seizure Missed medications?  No. Sleep deprived?  No. Alcohol intake?  No. Back to their usual baseline self?  Yes.  . If no, advise go to ER Increase instress? Yes . Oldest daughter past away in June has been on his mind a lot.  Current medications prescribed by Dr. Delice Lesch:   Keppra 500mg  3 tabs twice a day nirtriptyline 50mg  Take 2 capsules every night zonisamide 100mg  5 capsules at night  Pt gets second Covid shot tomorrow at 2 pm asking if shot could of caused seizure?

## 2020-06-03 ENCOUNTER — Ambulatory Visit: Payer: Medicaid Other | Admitting: Family Medicine

## 2020-07-22 ENCOUNTER — Telehealth: Payer: Self-pay | Admitting: Neurology

## 2020-07-22 NOTE — Telephone Encounter (Signed)
Pls let him know that diet by itself cannot replace medication. The diet can help supplement the medications to help with seizure, but it is not recommended he do diet by itself. If he understands and still would like referral to nutrition, ok to refer. thanks

## 2020-07-22 NOTE — Telephone Encounter (Signed)
Please advise 

## 2020-07-22 NOTE — Telephone Encounter (Signed)
Patient has questions about trying a "Keto" diet or a "glutene-free" diet instead of medication for his seizures. Patient wants to be referred to a nutritionist about this.

## 2020-07-23 ENCOUNTER — Other Ambulatory Visit: Payer: Self-pay

## 2020-07-23 DIAGNOSIS — G40201 Localization-related (focal) (partial) symptomatic epilepsy and epileptic syndromes with complex partial seizures, not intractable, with status epilepticus: Secondary | ICD-10-CM

## 2020-07-23 NOTE — Telephone Encounter (Signed)
Awaiting fax number to fax referral and Notes.

## 2020-07-23 NOTE — Telephone Encounter (Signed)
Spoke with patient, place referral to nutrition, unsure where at this time.

## 2020-07-24 NOTE — Telephone Encounter (Signed)
[place] nutrtion no longer has an nutirionist, Going to have patient contact his insurance and see who can do approval for him and have him contact office back for referral out.

## 2020-07-26 NOTE — Telephone Encounter (Signed)
Patient would like a referral to the [facility] nutrition and diabetes office here in our building

## 2020-07-29 ENCOUNTER — Other Ambulatory Visit: Payer: Self-pay

## 2020-07-29 DIAGNOSIS — G43009 Migraine without aura, not intractable, without status migrainosus: Secondary | ICD-10-CM

## 2020-07-29 DIAGNOSIS — G40201 Localization-related (focal) (partial) symptomatic epilepsy and epileptic syndromes with complex partial seizures, not intractable, with status epilepticus: Secondary | ICD-10-CM

## 2020-07-29 NOTE — Telephone Encounter (Signed)
Pending number. Will forward referral

## 2020-07-29 NOTE — Telephone Encounter (Signed)
[phone]. Placed referral

## 2020-07-31 ENCOUNTER — Other Ambulatory Visit: Payer: Self-pay

## 2020-07-31 ENCOUNTER — Encounter: Payer: Medicaid Other | Attending: Neurology | Admitting: Dietician

## 2020-07-31 ENCOUNTER — Encounter: Payer: Self-pay | Admitting: Dietician

## 2020-07-31 DIAGNOSIS — G40209 Localization-related (focal) (partial) symptomatic epilepsy and epileptic syndromes with complex partial seizures, not intractable, without status epilepticus: Secondary | ICD-10-CM | POA: Insufficient documentation

## 2020-07-31 NOTE — Patient Instructions (Addendum)
Work on adding coconut oil to drinks and food items or beverages.  Add high fat sauces to your vegetables.  Choose full fat dairy. This includes ice cream, milk shakes, whole milk  Increase your intake of nuts, seeds, nut butters, and plant based oils.   Work on eating consistently throughout the day.

## 2020-07-31 NOTE — Progress Notes (Signed)
Medical Nutrition Therapy  Appointment Start time:  9379  Appointment End time:  31  Primary concerns today: Keto diet for epilepsy  Referral diagnosis: G40.201 Localization-related symptomatic epilepsy and epileptic syndromes with complex partial seizures, not intractable, with status epilepticus  Preferred learning style: Auditory (Pt is visually impaired) Learning readiness: Ready   NUTRITION ASSESSMENT   Anthropometrics   Ht: 6'1" Wt: 237.2 lbs Body mass index is 31.29 kg/m.   Clinical Medical Hx: Epilepsy w/ siezures Medications: Keppra, Losartan Labs: N/A Notable Signs/Symptoms: N/A  Lifestyle & Dietary Hx *Pt is present with fiance Lashonda. Pt is visually impaired*  Pt is interested in beginning a ketogenic diet for their epilepsy. Pt was encouraged by multiple providers to purse this dietary change. Pt and pt wife report doing some research into ketogenic dieting, but wanted guidance from a dietitian. Pt wife reports she does all of the cooking. Wife states that she will joining her husband in the dietary intervention. Wife states that they try to eat 3 meals a day, but will miss lunch because of getting busy at work and will be very hungry at dinner time. Pt works at [employer]. Pt states they do not know what are some good food options to eat while at work. t reports lifting weights regularly, and doing cardio on off days.  Estimated daily fluid intake: 80 oz Supplements: Daily multivitamin, Fish Oil Sleep: Pretty well Stress / self-care: Occasional stress, workouts or plays game  Current average weekly physical activity: ADLs, was exercising every day for an hour (weight training/cardio)  24-Hr Dietary Recall First Meal: none Snack: none Second Meal: [address] [place] cheese steak, fries, [place] Snack: butter pecan gelato ice cream Third Meal: none Snack: Popcorn Beverages: water   NUTRITION DIAGNOSIS  NI-5.6.1 Inadequate fat intake As related to  epilepsy.  As evidenced by dietray recall higher in carbs, less than 75% caloric intake from fat, and pt admission of desire to start ketogenic diet.   NUTRITION INTERVENTION  Nutrition education (E-1) on the following topics:  . Ketogenic Diet o Educate pt on food sources high in fat. This includes nuts, seeds, nut butters, avocado, full fat dairy, high fat meats, and plant based oils. Educate pt on the need to transition slowly to a ketogenic diet from their current dietary pattern.Educate pt on the eventual goal of consuming 75-90% of daily kcal from fat sources. Recommend beginning to identify sources of dietary fat and increase intake, as well as substituting some of their carbohydrates with additional sources of fat. Advise pt to continue to take a daily multivitamin to avoid deficiencies. Educate pt on the caloric density of fats, and the need to still eat non-starchy vegetables and whole grains to ensure adequate fiber intake.  Handouts Provided Include  High Calorie, High Protein Nutrition Therapy Nutrition Care Manual** **- Modified to only include instruction on increasing fat content  Learning Style & Readiness for Change Teaching method utilized: Visual & Auditory  Demonstrated degree of understanding via: Teach Back  Barriers to learning/adherence to lifestyle change: Visual impairment  Goals Established by Pt  Work on adding coconut oil to drinks and food items or beverages.  Add high fat sauces to your vegetables.  Choose full fat dairy. This includes ice cream, milk shakes, whole milk  Increase your intake of nuts, seeds, nut butters, and plant based oils.   Work on eating consistently throughout the day.   MONITORING & EVALUATION Dietary intake, weekly physical activity, and fat intake  in 4-6 weeks.  Next Steps  Patient is to follow up with dietitian.

## 2020-08-25 ENCOUNTER — Other Ambulatory Visit: Payer: Self-pay | Admitting: Neurology

## 2020-08-25 DIAGNOSIS — G40201 Localization-related (focal) (partial) symptomatic epilepsy and epileptic syndromes with complex partial seizures, not intractable, with status epilepticus: Secondary | ICD-10-CM

## 2020-08-26 NOTE — Telephone Encounter (Signed)
Enough given till his follow up, no further refills

## 2020-09-11 ENCOUNTER — Ambulatory Visit: Payer: Medicaid Other | Admitting: Dietician

## 2020-09-25 ENCOUNTER — Encounter: Payer: Self-pay | Admitting: Neurology

## 2020-09-25 ENCOUNTER — Other Ambulatory Visit: Payer: Self-pay

## 2020-09-25 ENCOUNTER — Telehealth: Payer: Self-pay | Admitting: Neurology

## 2020-09-25 ENCOUNTER — Ambulatory Visit (INDEPENDENT_AMBULATORY_CARE_PROVIDER_SITE_OTHER): Payer: Medicaid Other | Admitting: Neurology

## 2020-09-25 VITALS — BP 116/81 | HR 81 | Ht 73.0 in | Wt 235.4 lb

## 2020-09-25 DIAGNOSIS — G43009 Migraine without aura, not intractable, without status migrainosus: Secondary | ICD-10-CM

## 2020-09-25 DIAGNOSIS — G40201 Localization-related (focal) (partial) symptomatic epilepsy and epileptic syndromes with complex partial seizures, not intractable, with status epilepticus: Secondary | ICD-10-CM

## 2020-09-25 MED ORDER — EMGALITY 120 MG/ML ~~LOC~~ SOAJ: SUBCUTANEOUS | 0 refills | Status: DC

## 2020-09-25 MED ORDER — ZONISAMIDE 100 MG PO CAPS: ORAL_CAPSULE | ORAL | 3 refills | Status: DC

## 2020-09-25 MED ORDER — EMGALITY 120 MG/ML ~~LOC~~ SOAJ: 1.0000 "pen " | SUBCUTANEOUS | 11 refills | Status: DC

## 2020-09-25 MED ORDER — LEVETIRACETAM 500 MG PO TABS: ORAL_TABLET | ORAL | 11 refills | Status: DC

## 2020-09-25 NOTE — Telephone Encounter (Signed)
Patient called office back about the letter he requested to be revised. 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   Patient asked when letter would be ready for pick up so he could try to line up transportation. Notified patient we require 7-10 business days once the request is placed. Please call patient once letter is ready for pick up.

## 2020-09-25 NOTE — Progress Notes (Signed)
Windell Norfolk KeyMarga Melnick - PA Case ID: [id number] - Rx #: [id number] Need help? Call us at [phone] Outcome Approvedtoday Request Reference Number: [id number]. EMGALITY INJ 120MG /ML is approved through 12/26/2020. For further questions, call [employer] at [phone]. Drug Emgality 120MG /ML auto-injectors (migraine) Form [employer] Medicaid Electronic Prior Authorization Form (2017 NCPDP) Original Claim Info 36

## 2020-09-25 NOTE — Patient Instructions (Signed)
Good to see you!!  1. Emgality first dose will be today, next dose will be next month  2. Continue all your other medications  3. Follow-up in 6-8 months, call for any changes   Seizure Precautions: 1. If medication has been prescribed for you to prevent seizures, take it exactly as directed.  Do not stop taking the medicine without talking to your doctor first, even if you have not had a seizure in a long time.   2. Avoid activities in which a seizure would cause danger to yourself or to others.  Don't operate dangerous machinery, swim alone, or climb in high or dangerous places, such as on ladders, roofs, or girders.  Do not drive unless your doctor says you may.  3. If you have any warning that you may have a seizure, lay down in a safe place where you can't hurt yourself.    4.  No driving for 6 months from last seizure, as per [facility].   Please refer to the following link on the [place] website for more information: [email or web address]   5.  Maintain good sleep hygiene.  6.  Contact your doctor if you have any problems that may be related to the medicine you are taking.  7.  Call 911 and bring the patient back to the ED if:        A.  The seizure lasts longer than 5 minutes.       B.  The patient doesn't awaken shortly after the seizure  C.  The patient has new problems such as difficulty seeing, speaking or moving  D.  The patient was injured during the seizure  E.  The patient has a temperature over 102 F (39C)  F.  The patient vomited and now is having trouble breathing

## 2020-09-25 NOTE — Progress Notes (Signed)
11/19 last seizure

## 2020-09-25 NOTE — Progress Notes (Signed)
Pt was given a sample of Emgality in the office loading dose of medication. Education on how to give the medication was given. This nurse gave the pt the 1st injection, then the pt gave his self the 2nd injection demonstrating that he understood the instructions on how to self administer injection of next month, all questions answered,

## 2020-09-25 NOTE — Progress Notes (Signed)
NEUROLOGY FOLLOW UP OFFICE NOTE  Don Reed [id number] 1985/12/22  HISTORY OF PRESENT ILLNESS: I had the pleasure of seeing Don Reed in follow-up in the neurology clinic on 09/25/2020.  The patient was last seen almost a year ago for seizures and headaches. He is accompanied by his significant other Don Reed who helps supplement the history today.  He has had 4 seizures this past year, he called our office about a seizure on 09/25/29 in the bathroom of [employer] where he had urinary incontinence. He had seizures on 11/19, 11/28. Don Reed witnessed these, he stood up and just fell, staring off for 3 minutes with no convulsive activity. His last seizure was 08/10/20 while in the shower, he was leaned over with a headache, then came to and alerted Don Reed that he had one. Zonisamide was increased to 600mg  qhs after the seizures in November, he is also on Levetiracetam 1500mg  BID. He reports gustatory hallucinations where he tastes aluminum.  He is switching to the keto diet under the guidance of Nutrition services.He has back pain and left sciatica, and feels that when his nerve pain lasts all day (versus short period of time), this tells him it is seizure-related. He has seen Ortho and given prn anti-inflammatory medication which helps. No paresthesias. He has some constipation. He continues to reports 4-5 headaches a week, he may take a Tylenol but usually just lays down. He is on nortriptyline 100mg  qhs for migraine prophylaxis. No side effects on medications. He does not drive.   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 He states that at age 35, he was having episodes of slurred speech, falls, recurrent syncope, and headaches, and was found to have the brain tumor. He recalls when he was younger, his head kept turning to one side and he would try to stop it. After the surgery, he did fairly  well except for frequent headaches, then had his first seizure at age 35 or 35. He describes seizures as starting with back pain, then he gets a metallic taste in his mouth, his head starts hurting more, then he gets disoriented and has been told he would become unresponsive then proceed to generalized shaking, at times with urinary incontinence. He lives with his fiancee and brother, and denies any isolated staring spells. He has been told he has seizures in his sleep. After a seizure, he denies any focal weakness, he can hear people around him but has difficulty getting his words out. He also has some nausea. He has fallen twice this year with his seizures, he was in the ER on 09/05/15 and 12/02/15 for seizure, during both ER visits, he would have a witnessed seizure in the ER. With the most recent seizure on 12/02/15, he had ran out of Vimpat. He has been taking Vimpat 100mg  BID since 2015, on Keppra 1500mg  BID since 2011. He recalls taking Depakote and Topamax in the past, with cognitive side effects. He continues to report memory issues, "I can't think too good." He feels his left side is different, with numbness in his left arm and leg. He had cervical fusion surgery and has been told this is the cause of his left-sided symptoms. His last shunt revision was in 1999. 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   He has a history of chronic headaches, with throbbing headaches occurring around twice a  week over the frontal and temporal regions, with associated nausea, vomiting, photo/phonophobia. They would last for 1 to 1-1/2 days, he does not take any medication because he feels they have provoked seizures in the past. He reports taking an unrecalled headache preventative medication in [place] which was helpful. He denies any dizziness, bowel/bladder dysfunction, no myoclonic jerks. He has occasional twitching in an arm or leg. He has chronic neck and back pain.   Epilepsy Risk Factors:  His father had  generalized convulsions, his mother had seizures as well. He has a history of benign brain tumor s/p VP shunt, last revision in 1999. He reports several head injuries with loss of consciousness, most recently when hit in the face by a ball a few years ago. Otherwise he had a normal birth and early development.  There is no history of febrile convulsions, CNS infections such as meningitis/encephalitis.  Prior AEDs: Depakote, Topamax, Vimpat  Diagnostic Data: MRI brain with and without contrast 07/27/2016: no acute changes seen. There were stable postsurgical changes in the posterior fossa related to resection of previous mass. 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 No recurrent mass or abnormal enhancement seen. There is a right transfrontal ventricular shunt seen terminating within the right ventricle. Hippocami symmetric.  MRI cervical spine in 07/2017 showed moderate stenosis at C3-4, moderate foraminal encroachment right greater than left at C3-4, no change from prior MRI. There is moderate right foraminal narrowing at C4-5 and mild left foraminal narrowing at C4-5, unchanged. 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   PAST MEDICAL HISTORY: Past Medical History:  Diagnosis Date  . Asthma   . Cancer ([place])   . Chronic back pain   . History of brain shunt   . History of brain tumor   . Hydrocephalus ([place])   . Legally blind   . Migraine   . Seizures ([place])   . Vision loss     MEDICATIONS: Current Outpatient Medications on File Prior to Visit  Medication Sig Dispense Refill  . levETIRAcetam (KEPPRA) 500 MG tablet TAKE 3 TABLETS BY MOUTH TWICE DAILY 180 tablet 0  . Lidocaine (HM LIDOCAINE PATCH) 4 % PTCH Apply 1 patch topically daily. 10 patch 0  . nortriptyline (PAMELOR) 50 MG capsule  Take 2 capsules every night 180 capsule 3  . ondansetron (ZOFRAN ODT) 4 MG disintegrating tablet Take 1 tablet (4 mg total) by mouth every 8 (eight) hours as needed for nausea or vomiting. (Patient not taking: Reported on 07/31/2020) 10 tablet 0  . PROVENTIL HFA 108 (90 BASE) MCG/ACT inhaler Inhale 2 puffs into the lungs every 6 (six) hours as needed for shortness of breath.   3  . rosuvastatin (CRESTOR) 10 MG tablet Take 10 mg by mouth daily. (Patient not taking: Reported on 07/31/2020)    . zonisamide (ZONEGRAN) 100 MG capsule Take 6 capsules at night 540 capsule 2  . [DISCONTINUED] diphenhydramine-acetaminophen (TYLENOL PM) 25-500 MG TABS tablet Take 2 tablets by mouth at bedtime as needed (for sleep).      No current facility-administered medications on file prior to visit.    ALLERGIES: Allergies  Allergen Reactions  . Amoxicillin Itching and Rash    Has patient had a PCN reaction causing immediate rash, facial/tongue/throat swelling, SOB or lightheadedness with hypotension: Yes Has patient had a PCN reaction  causing severe rash involving mucus membranes or skin necrosis: No Has patient had a PCN reaction that required hospitalization No Has patient had a PCN reaction occurring within the last 10 years: Yes If all of the above answers are "NO", then may proceed with Cephalosporin use.   . Shellfish Allergy Anaphylaxis, Hives, Shortness Of Breath and Rash    Cannot breathe   . Vancomycin Itching and Rash    FAMILY HISTORY: Family History  Problem Relation Age of Onset  . Hypertension Mother   . Seizures Mother   . Cataracts Mother   . Stroke Mother   . Stroke Father   . Seizures Father   . Scoliosis Sister   . Vision loss Sister   . Vision loss Brother   . Diabetes Brother   . Leukemia Brother     SOCIAL HISTORY: Social History   Socioeconomic History  . Marital status: Divorced    Spouse name: Not on file  . Number of children: 2  . Years of education: GED  . Highest  education level: Not on file  Occupational History    Employer: [employer]  Tobacco Use  . Smoking status: Never Smoker  . Smokeless tobacco: Never Used  Vaping Use  . Vaping Use: Never used  Substance and Sexual Activity  . Alcohol use: Not Currently  . Drug use: No  . Sexual activity: Not on file  Other Topics Concern  . Not on file  Social History Narrative   Patient lives at home with family.   Caffeine Use: 1-3 time weekly   Right handed   Social Determinants of Health   Financial Resource Strain: Not on file  Food Insecurity: Not on file  Transportation Needs: Not on file  Physical Activity: Not on file  Stress: Not on file  Social Connections: Not on file  Intimate Partner Violence: Not on file     PHYSICAL EXAM: Vitals:   09/25/20 0835  BP: 116/81  Pulse: 81  SpO2: 100%   General: No acute distress, flat affect Head:  Normocephalic/atraumatic Skin/Extremities: No rash, no edema Neurological Exam: alert and awake. No aphasia or dysarthria.Attention and concentration are normal.   Cranial nerves: Pupils equal, round. Extraocular movements intact with nystagmoid eye movements in all directions of gaze (similar to prior). Visual fields full.  No facial asymmetry.  Motor: Bulk and tone normal, muscle strength 5/5 throughout with no pronator drift. Sensation decreased vibration on right UE and ankle.  Finger to nose testing intact.  Gait narrow-based and steady, able to tandem walk adequately.  Romberg negative.   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 EEG normal, MRI brain in 06/2016 showed no acute changes with postsurgical posterior fossa changes seen, right transfrontal shunt in place. He has a baseline of 3-4 seizures a year, last seizure was on 08/10/20, continue  Levetiracetam 1500mg  BID and Zonisamide 600mg  qhs. He reports an increase in migraines, start Emgality (first 2 doses given today), side effects discussed. Continue nortriptyline 100mg  qhs. He does not drive. Follow-up in 6-8 months, call for any changes.    Thank you for allowing me to participate in his care.  Please do not hesitate to call for any questions or concerns.   Ellouise Newer, M.D.   CC: Dr. Criss Rosales

## 2020-09-25 NOTE — Telephone Encounter (Signed)
Patient was seen today and called back in about a letter Dr. Delice Lesch wrote releasing him back to work.   He said after giving it some thought he'd like a new letter stating something like: patient not recommended to return to the work place in person due to his condition at this time.  He said he'd feel much safer working from home.  He will come to the office to pick it up.

## 2020-10-01 ENCOUNTER — Ambulatory Visit: Payer: Medicaid Other | Admitting: Dietician

## 2020-10-08 ENCOUNTER — Encounter: Payer: Self-pay | Admitting: Neurology

## 2020-10-08 NOTE — Telephone Encounter (Signed)
Done, thanks

## 2020-10-08 NOTE — Telephone Encounter (Signed)
Pt called no answer voice mail left that letter is at the front desk

## 2020-10-10 ENCOUNTER — Ambulatory Visit: Payer: Medicaid Other | Admitting: Dietician

## 2020-10-17 ENCOUNTER — Telehealth: Payer: Self-pay | Admitting: Neurology

## 2020-10-17 NOTE — Telephone Encounter (Signed)
Patient is having migraines and the medication is not working and needs to know what he can do.  Patient is having migraines everyday  Please call

## 2020-10-18 NOTE — Telephone Encounter (Signed)
Patient called back in and still has a migraine today. He would like to see if he can find out something he can do for them?

## 2020-10-22 NOTE — Telephone Encounter (Signed)
Can offer migraine cocktail with 30mg  toradol, reglan, phenergan (check allergies). He should be due for next dose of Emgality in the next few days, would give it some time to work. Thanks

## 2020-10-23 NOTE — Telephone Encounter (Signed)
Pt stated that he does not have a headache today, he is going to call the pharmacy to make sure they have his emgality so that he can pick it up ,

## 2020-10-23 NOTE — Telephone Encounter (Signed)
Pt called no answer left a voice mail to call the office back  °

## 2020-10-25 ENCOUNTER — Telehealth: Payer: Self-pay | Admitting: Neurology

## 2020-10-25 NOTE — Telephone Encounter (Signed)
See note from 10/22/20  Per pt [facility] will not have his Emgality in til Monday.   Pt advised Dr.Aqunio out of the office as previously told. The last visit for a Cocktail was 3 pm. We can ask another provider if he could have a Prednisone taper?  IF the Migraine gets worse please give [place] a call back or go to the ED/ Urgent Care.    Please advise.

## 2020-10-25 NOTE — Telephone Encounter (Signed)
New message    Patient C/o still having migraine is asking for a call back from the nurse to discuss migraine cocktail

## 2020-10-28 ENCOUNTER — Telehealth: Payer: Self-pay | Admitting: Neurology

## 2020-10-28 NOTE — Telephone Encounter (Signed)
Patient called and said the pharmacy told him his zonisamide 100 MG was recalled. He'd like a call back about this.

## 2020-10-28 NOTE — Telephone Encounter (Signed)
Patient called and said he'd like another medication to take when he has a breakthrough headache. He is planning on picking up his Emgality today.  [place]

## 2020-10-28 NOTE — Telephone Encounter (Signed)
Would try Relpax, I'll send in Rx. If he is still having the migraine from last week, we can try a prednisone 1-week course to break the headache cycle, thanks

## 2020-10-28 NOTE — Telephone Encounter (Signed)
Pls check if he has been on migraine rescue medications such as Imitrex (sumatriptan) or Maxalt or Relpax in the past? Thanks

## 2020-10-28 NOTE — Telephone Encounter (Signed)
He has tried imitrex in the past. No help, please advise.

## 2020-10-28 NOTE — Telephone Encounter (Signed)
Pt to contact pharmacy and see.

## 2020-10-29 ENCOUNTER — Telehealth: Payer: Self-pay | Admitting: Neurology

## 2020-10-29 MED ORDER — PREDNISONE 20 MG PO TABS: ORAL_TABLET | ORAL | 0 refills | Status: DC

## 2020-10-29 MED ORDER — ELETRIPTAN HYDROBROMIDE 20 MG PO TABS: ORAL_TABLET | ORAL | 11 refills | Status: DC

## 2020-10-29 NOTE — Telephone Encounter (Signed)
Rx sent for both, pls have him take the prednisone in the morning with food to help with side effects such as insomnia and GI upset. Thanks

## 2020-10-29 NOTE — Telephone Encounter (Signed)
Pt called and talked through how to give his emglaity shot over the phone,  Pt would like to try the Relpax, also would like the prednisone 1-week course to break the headache cycle,

## 2020-10-29 NOTE — Telephone Encounter (Signed)
New message   Patient needs clarification on direction of medication  Galcanezumab-gnlm (EMGALITY) 120 MG/ML SOAJ  Galcanezumab-gnlm (EMGALITY) 120 MG/ML SOAJ

## 2020-10-30 NOTE — Telephone Encounter (Signed)
Pt called and informed that Rx sent for both take the prednisone in the morning with food to help with side effects such as insomnia and GI upset. Pt verbalized understanding

## 2020-11-13 ENCOUNTER — Telehealth: Payer: Self-pay | Admitting: Neurology

## 2020-11-13 NOTE — Telephone Encounter (Signed)
Pt called back no answer left a voice mail for him to call the office back

## 2020-11-13 NOTE — Telephone Encounter (Signed)
New message ° ° °Patient returning call back to nurse.  °

## 2020-11-13 NOTE — Telephone Encounter (Signed)
Patient called and said he finished his prednisone and his headache has come back. Please advise?  [facility] [address]

## 2020-11-14 NOTE — Telephone Encounter (Signed)
F/u   Patient is asking for the side effect of medication - aware the medication pharmacy now.

## 2020-11-14 NOTE — Telephone Encounter (Signed)
Pt did not get his relpax from the pharmacy pt advised to call the pharmacy to get them to fill it so he can pick to up so he can take it for his headache, pt stated that he would call them

## 2020-11-14 NOTE — Telephone Encounter (Signed)
Patient called back in stating the pharmacy told him the Relpax needs prior authorization

## 2020-11-14 NOTE — Telephone Encounter (Signed)
Pt called an informed that we will start working on PA for relpax he is also asking about the  side effect of medication

## 2020-11-21 ENCOUNTER — Other Ambulatory Visit: Payer: Self-pay | Admitting: Neurology

## 2020-11-21 DIAGNOSIS — G43001 Migraine without aura, not intractable, with status migrainosus: Secondary | ICD-10-CM

## 2020-11-26 NOTE — Telephone Encounter (Signed)
Patient called to check on the status of his prior authorization.

## 2020-11-27 NOTE — Telephone Encounter (Signed)
Pt informed that we are working on his PA we are waiting on the determination from insurance

## 2020-12-04 NOTE — Progress Notes (Signed)
Windell Norfolk Key: [id number] - PA Case ID: [id number] - Rx #: [id number] Need help? Call us at [phone] Status Sent to [place] Drug Eletriptan Hydrobromide 20MG  tablets Form [employer] Medicaid Electronic Prior Authorization Form (2017 NCPDP) Original Claim Info [phone] PA Req, Dr submit ePA at [facility].comor MD Call [phone] NF-3 DS SUBMIT PA# 72 PA TYPE8

## 2020-12-06 ENCOUNTER — Telehealth: Payer: Self-pay | Admitting: Neurology

## 2020-12-06 NOTE — Progress Notes (Signed)
Windell [place] Key: [id number] - PA Case ID: [id number] - Rx #: [id number] Outcome Approvedon June 8 Request Reference Number: [id number]. ELETRIPTAN TAB 20MG  is approved through 12/04/2021. For further questions, call [employer] at [phone]. Drug Eletriptan Hydrobromide 20MG  tablets Form [employer] Medicaid Electronic Prior Authorization Form (2017 NCPDP) Original Claim Info [id number] PA Req, Dr submit ePA at [facility].comor MD Call [phone] NF-3 DS SUBMIT PA# 72 PA TYPE8

## 2020-12-06 NOTE — Progress Notes (Signed)
I called patient an advised.

## 2020-12-06 NOTE — Telephone Encounter (Signed)
Called in regarding his medicine that needs prior auth. Michela Pitcher he has had multiple migraines. He said it was a new medicine that he hasn't taken yet. Please call him. [facility] is waiting on it and we haven't replied

## 2020-12-12 ENCOUNTER — Telehealth: Payer: Self-pay | Admitting: Neurology

## 2020-12-12 NOTE — Telephone Encounter (Signed)
Patient called in stating his dentist sent a form to clear him to have a tooth extracted and he is wanting to see if that was received?

## 2020-12-13 NOTE — Telephone Encounter (Signed)
This is the form I gave you, pls let him know it is done, thanks!

## 2020-12-13 NOTE — Telephone Encounter (Signed)
Notified an advised.

## 2021-01-04 ENCOUNTER — Other Ambulatory Visit: Payer: Self-pay | Admitting: Orthopedic Surgery

## 2021-01-04 DIAGNOSIS — M4800 Spinal stenosis, site unspecified: Secondary | ICD-10-CM

## 2021-01-06 NOTE — Progress Notes (Signed)
Don Reed (Key: [id number]) [id number] Emgality 120MG /ML auto-injectors (migraine)     Status: PA Request  Created: July 5th, 2022 [phone]  Sent: July 11th, 2022  Open

## 2021-01-06 NOTE — Progress Notes (Signed)
error 

## 2021-01-21 ENCOUNTER — Other Ambulatory Visit: Payer: Self-pay

## 2021-01-21 ENCOUNTER — Ambulatory Visit
Admission: RE | Admit: 2021-01-21 | Discharge: 2021-01-21 | Disposition: A | Discharge status: Home / Self Care | Payer: Medicaid Other | Source: Ambulatory Visit | Attending: Orthopedic Surgery | Admitting: Orthopedic Surgery

## 2021-01-21 DIAGNOSIS — M4800 Spinal stenosis, site unspecified: Secondary | ICD-10-CM

## 2021-02-17 ENCOUNTER — Telehealth: Payer: Self-pay | Admitting: Neurology

## 2021-02-17 NOTE — Telephone Encounter (Signed)
Pt c/o: seizure Missed medications?  No. Sleep deprived?  Yes.   Alcohol intake?  No. Back to their usual baseline self?  Yes.  . still tired head hurts Any increase in stress? About the same  Current medications prescribed by Dr. Delice Lesch:  zonegran 100 mg  6 capsules at night   Levetiracetam 500 mg 3 tablets BID

## 2021-02-17 NOTE — Telephone Encounter (Signed)
Pt called in and left message with Access Nurse. He had a seizure and would like to know what to do?

## 2021-02-18 ENCOUNTER — Telehealth: Payer: Self-pay | Admitting: Neurology

## 2021-02-18 NOTE — Telephone Encounter (Signed)
Can he come on 8/29 at 2:30pm? Keep his Jan appt also. Thanks!

## 2021-02-18 NOTE — Telephone Encounter (Signed)
Pt is able to come in on Monday 29th at 2:30pm. He wants to know if he is supposed to continue keppra and zonegram.

## 2021-02-18 NOTE — Telephone Encounter (Signed)
Called and LMOM  to see if patient could come in on 02-24-21

## 2021-02-18 NOTE — Telephone Encounter (Signed)
Pt called and advised to continue keppra and zonegram as ordered by Dr Delice Lesch

## 2021-02-24 ENCOUNTER — Other Ambulatory Visit: Payer: Self-pay

## 2021-02-24 ENCOUNTER — Ambulatory Visit (INDEPENDENT_AMBULATORY_CARE_PROVIDER_SITE_OTHER): Payer: Medicaid Other | Admitting: Neurology

## 2021-02-24 ENCOUNTER — Encounter: Payer: Self-pay | Admitting: Neurology

## 2021-02-24 VITALS — BP 114/79 | HR 72 | Ht 73.0 in | Wt 229.2 lb

## 2021-02-24 DIAGNOSIS — G40201 Localization-related (focal) (partial) symptomatic epilepsy and epileptic syndromes with complex partial seizures, not intractable, with status epilepticus: Secondary | ICD-10-CM | POA: Diagnosis not present

## 2021-02-24 DIAGNOSIS — G43009 Migraine without aura, not intractable, without status migrainosus: Secondary | ICD-10-CM | POA: Diagnosis not present

## 2021-02-24 DIAGNOSIS — Z982 Presence of cerebrospinal fluid drainage device: Secondary | ICD-10-CM

## 2021-02-24 MED ORDER — LAMOTRIGINE 25 MG PO TABS: ORAL_TABLET | ORAL | 6 refills | Status: DC

## 2021-02-24 NOTE — Progress Notes (Signed)
NEUROLOGY FOLLOW UP OFFICE NOTE  Don Reed CS:[id number] 1986/02/05  HISTORY OF PRESENT ILLNESS: I had the pleasure of seeing Don Reed in follow-up in the neurology clinic on 02/24/2021.  The patient was last seen 5 months ago for seizures and headaches. He is alone in the office today. Since his last visit, he called our office on 8/22 that he had 2 seizures in a week, one of which he fell and had urinary incontinence. He had one yesterday in the car, there was no warning. His back was hurting at church so they helped him to the car. As they put his chair back, he lost time and Don Reed said she thinks he had a seizure. No tongue bite or incontinence. He is on Levetiracetam '1500mg'$  BID and Zonisamide '600mg'$  qhs with no side effects. He denies missing doses. He reported sleep deprivation but today reports sleep is okay. He feels dizzy almost daily despite keeping hydrated. He fell this morning, saying the dizziness is hard to explain, "there is pain." He has a hungry feeling but he is not hungry. When he bends down, he feels lightheaded and loopy. He is supposed to do physical therapy for his back. Headaches are better with the [facility], he still get them but not like before. He is also on nortriptyline '100mg'$  qhs, no side effects.    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 He states that at age 35, he was having episodes of slurred speech, falls, recurrent syncope, and headaches, and was found to have the brain tumor. He recalls when he was younger, his head kept turning to one side and he would try to stop it. After the surgery, he did fairly well except for frequent headaches, then had his first seizure at age 35 or 49. He describes seizures as starting with back pain, then he gets a metallic taste in his mouth, his head starts hurting more, then he gets disoriented and has been told he would  become unresponsive then proceed to generalized shaking, at times with urinary incontinence. He lives with his fiancee and brother, and denies any isolated staring spells. He has been told he has seizures in his sleep. After a seizure, he denies any focal weakness, he can hear people around him but has difficulty getting his words out. He also has some nausea. He has fallen twice this year with his seizures, he was in the ER on 09/05/15 and 12/02/15 for seizure, during both ER visits, he would have a witnessed seizure in the ER. With the most recent seizure on 12/02/15, he had ran out of Vimpat. He has been taking Vimpat '100mg'$  BID since 2015, on Keppra '1500mg'$  BID since 2011. He recalls taking Depakote and Topamax in the past, with cognitive side effects. He continues to report memory issues, "I can't think too good." He feels his left side is different, with numbness in his left arm and leg. He had cervical fusion surgery and has been told this is the cause of his left-sided symptoms. His last shunt revision was in 1999. 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    He has a history of chronic headaches, with throbbing headaches occurring around twice a week over the frontal and temporal regions, with associated nausea, vomiting, photo/phonophobia. They would last for 1 to 1-1/2 days, he does not take any medication because  he feels they have provoked seizures in the past. He reports taking an unrecalled headache preventative medication in [place] which was helpful. He denies any dizziness, bowel/bladder dysfunction, no myoclonic jerks. He has occasional twitching in an arm or leg. He has chronic neck and back pain.    Epilepsy Risk Factors:  His father had generalized convulsions, his mother had seizures as well. He has a history of benign brain tumor s/p VP shunt, last revision in 1999. He reports several head injuries with loss of consciousness, most recently when hit in the face by a ball a few years ago.  Otherwise he had a normal birth and early development.  There is no history of febrile convulsions, CNS infections such as meningitis/encephalitis.   Prior AEDs: Depakote, Topamax, Vimpat  Diagnostic Data: MRI brain with and without contrast 07/27/2016: no acute changes seen. There were stable postsurgical changes in the posterior fossa related to resection of previous mass. 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 No recurrent mass or abnormal enhancement seen. There is a right transfrontal ventricular shunt seen terminating within the right ventricle. Hippocami symmetric.  MRI cervical spine in 07/2017 showed moderate stenosis at C3-4, moderate foraminal encroachment right greater than left at C3-4, no change from prior MRI. There is moderate right foraminal narrowing at C4-5 and mild left foraminal narrowing at C4-5, unchanged. 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   PAST MEDICAL HISTORY: Past Medical History:  Diagnosis Date   Asthma    Cancer ([place])    Chronic back pain    History of brain shunt    History of brain tumor    Hydrocephalus ([place])    Legally blind    Migraine    Seizures ([place])    Vision loss     MEDICATIONS: Current Outpatient Medications on File Prior to Visit  Medication Sig Dispense Refill   eletriptan (RELPAX) 20 MG tablet Take 1 tablet as needed at onset of migraine. Do not take more than 2-3 a week 10 tablet 11   Galcanezumab-gnlm (EMGALITY) 120 MG/ML SOAJ Inject 1 pen into the skin every 30 (thirty) days. 1.12 mL 11   levETIRAcetam (KEPPRA) 500 MG tablet TAKE 3 TABLETS BY MOUTH TWICE DAILY 180 tablet 11   methocarbamol (ROBAXIN) 500 MG tablet Take by mouth 2 (two) times daily.     Multiple Vitamin (MULTIVITAMIN) tablet Take 1 tablet by mouth daily.      nortriptyline (PAMELOR) 50 MG capsule TAKE 2 CAPSULES BY MOUTH EVERY NIGHT 180 capsule 2   Omega-3 Fatty Acids (FISH OIL OMEGA-3) 1000 MG CAPS Take by mouth.     PROVENTIL HFA 108 (90 BASE) MCG/ACT inhaler Inhale 2 puffs into the lungs every 6 (six) hours as needed for shortness of breath.   3   zonisamide (ZONEGRAN) 100 MG capsule Take 6 capsules at night 540 capsule 3   [DISCONTINUED] diphenhydramine-acetaminophen (TYLENOL PM) 25-500 MG TABS tablet Take 2 tablets by mouth at bedtime as needed (for sleep).      No current facility-administered medications on file prior to visit.    ALLERGIES: Allergies  Allergen Reactions   Amoxicillin Itching and Rash    Has patient had a PCN reaction causing immediate rash, facial/tongue/throat swelling, SOB or lightheadedness with hypotension: Yes Has patient had a PCN reaction causing severe rash involving  mucus membranes or skin necrosis: No Has patient had a PCN reaction that required hospitalization No Has patient had a PCN reaction occurring within the last 10 years: Yes If all of the above answers are "NO", then may proceed with Cephalosporin use.    Shellfish Allergy Anaphylaxis, Hives, Shortness Of Breath and Rash    Cannot breathe    Vancomycin Itching and Rash    FAMILY HISTORY: Family History  Problem Relation Age of Onset   Hypertension Mother    Seizures Mother    Cataracts Mother    Stroke Mother    Stroke Father    Seizures Father    Scoliosis Sister    Vision loss Sister    Vision loss Brother    Diabetes Brother    Leukemia Brother     SOCIAL HISTORY: Social History   Socioeconomic History   Marital status: Single    Spouse name: Not on file   Number of children: 2   Years of education: GED   Highest education level: Not on file  Occupational History    Employer: [employer]  Tobacco Use   Smoking status: Never   Smokeless tobacco: Never  Vaping Use   Vaping Use: Never used  Substance and Sexual  Activity   Alcohol use: Not Currently   Drug use: No   Sexual activity: Not on file  Other Topics Concern   Not on file  Social History Narrative   Patient lives at home with family.   Caffeine Use: 1-3 time weekly   Right handed   Social Determinants of Health   Financial Resource Strain: Not on file  Food Insecurity: Not on file  Transportation Needs: Not on file  Physical Activity: Not on file  Stress: Not on file  Social Connections: Not on file  Intimate Partner Violence: Not on file     PHYSICAL EXAM: Vitals:   02/24/21 1415  BP: 114/79  Pulse: 72  SpO2: 100%   General: No acute distress Head:  Normocephalic/atraumatic Skin/Extremities: No rash, no edema Neurological Exam: alert and awake. No aphasia or dysarthria. Fund of knowledge is appropriate. Attention and concentration are normal.   Cranial nerves: Pupils equal, round. Extraocular movements intact with nystagmoid eye movements in all directions of gaze (similar to prior). Visual fields full.  No facial asymmetry.  Motor: Bulk and tone normal, muscle strength 5/5 throughout with no pronator drift.   Finger to nose testing intact.  Gait narrow-based and steady, able to tandem walk adequately.  Romberg negative.   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 EEG normal, MRI brain in 06/2016 showed no acute changes with postsurgical posterior fossa changes seen, right transfrontal shunt in place. He is having an increase in seizures (baseline 3-4 a year), he has had 3 in the past 2 weeks. MRI brain with and without contrast will be ordered to assess for underlying structural abnormality and VP shunt. We discussed adding a third seizure medication Lamotrigine. Side effects, including Kathreen Cosier, were discussed. Start Lamotrigine  '25mg'$  BID x 2 weeks, then increase to '50mg'$  BID. Continue Levetiracetam '1500mg'$  BID and Zonisamide '600mg'$  qhs. Headaches improved with Emgality, he is also on nortriptyline '100mg'$  qhs. He does not drive. Follow-up in 3-4 months, call for any changes.    Thank  you for allowing me to participate in his care.  Please do not hesitate to call for any questions or concerns.    Ellouise Newer, M.D.   CC: Dr. Criss Rosales

## 2021-02-24 NOTE — Patient Instructions (Addendum)
Schedule open MRI brain with and without contrast  2. Start Lamotrigine '25mg'$ : Take 1 tablet twice a day for 2 weeks, then increase to 2 tablets twice a day  3. Continue Zonisamide '100mg'$ ; Take 6 capsules every night  4. Continue Keppra '500mg'$ : Take 3 tablets twice a day  5. Continue nortriptyline '50mg'$ : Take 2 capsules every night  6. Continue Emgality injections every month  7. Follow-up in 3-4 months, all for any changes   Seizure Precautions: 1. If medication has been prescribed for you to prevent seizures, take it exactly as directed.  Do not stop taking the medicine without talking to your doctor first, even if you have not had a seizure in a long time.   2. Avoid activities in which a seizure would cause danger to yourself or to others.  Don't operate dangerous machinery, swim alone, or climb in high or dangerous places, such as on ladders, roofs, or girders.  Do not drive unless your doctor says you may.  3. If you have any warning that you may have a seizure, lay down in a safe place where you can't hurt yourself.    4.  No driving for 6 months from last seizure, as per [facility].   Please refer to the following link on the [place] website for more information: http://www.epilepsyfoundation.org/answerplace/Social/driving/drivingu.cfm   5.  Maintain good sleep hygiene.  6.  Contact your doctor if you have any problems that may be related to the medicine you are taking.  7.  Call 911 and bring the patient back to the ED if:        A.  The seizure lasts longer than 5 minutes.       B.  The patient doesn't awaken shortly after the seizure  C.  The patient has new problems such as difficulty seeing, speaking or moving  D.  The patient was injured during the seizure  E.  The patient has a temperature over 102 F (39C)  F.  The patient vomited and now is having trouble breathing

## 2021-02-25 ENCOUNTER — Encounter: Payer: Self-pay | Admitting: Neurology

## 2021-02-25 ENCOUNTER — Telehealth: Payer: Self-pay | Admitting: Neurology

## 2021-02-25 NOTE — Telephone Encounter (Signed)
Pt called informed that he was just started on Lamotigine yesterday he needs to give it time to get into his system. Pt advised that if the heat is a trigger for his seizures try not to be out in it unless he has to be, do not over do it. Pt asking for [place] to call [employer] to ask if they will increase and help him more because of his seizures pt advised that he needs to call and talk to them and if they can, they will ask [place] for any information they need.

## 2021-02-25 NOTE — Telephone Encounter (Signed)
Pt called in stating he had a seizure today and had just started taking the new lamotrigine today also. He thinks it might be the heat that might be causing the seizures. He was out in the sun today and the ones in the past also.

## 2021-02-26 NOTE — Progress Notes (Signed)
Cpt code [id number], approved Mri with and without contrast. Case [id number], approval [id number]. Sent to [facility].pending appt.

## 2021-02-27 ENCOUNTER — Telehealth: Payer: Self-pay

## 2021-02-27 NOTE — Telephone Encounter (Signed)
New message    Paperwork from [facility]    MRI has been authorized expire 04/12/2021

## 2021-03-05 ENCOUNTER — Telehealth: Payer: Self-pay | Admitting: Neurology

## 2021-03-05 NOTE — Telephone Encounter (Signed)
Patient called with questions about where to call to schedule his open MRI.

## 2021-03-05 NOTE — Telephone Encounter (Signed)
Patient advised to call [facility]. [phone].

## 2021-03-13 ENCOUNTER — Telehealth: Payer: Self-pay

## 2021-03-13 ENCOUNTER — Telehealth: Payer: Self-pay | Admitting: Neurology

## 2021-03-13 NOTE — Telephone Encounter (Signed)
Pt called in stating he was given a website when he worked at the [employer] and was thinking of getting one, but wants to know if Dr. Delice Lesch thinks it would be good for him to see if he could get one trained for the blind and to help with his seizures?

## 2021-03-13 NOTE — Telephone Encounter (Signed)
I called patient to follow up on MRI time, asked patient to call office back with time, since approval was given, I was just following back up with patient.

## 2021-03-13 NOTE — Telephone Encounter (Signed)
Patient scheduled for MRI 03/18/21 and is claustrophobic. He'd like something called into help with this.  [facility] on [address]

## 2021-03-17 ENCOUNTER — Other Ambulatory Visit: Payer: Self-pay | Admitting: Neurology

## 2021-03-17 MED ORDER — DIAZEPAM 5 MG PO TABS
ORAL_TABLET | ORAL | 0 refills | Status: DC
Start: 2021-03-17 — End: 2021-07-09

## 2021-03-17 NOTE — Telephone Encounter (Signed)
Pt was asking about getting a seizure dog, he stated he had started the paperwork on getting one wanted Dr Delice Lesch to know

## 2021-03-17 NOTE — Telephone Encounter (Signed)
Pt called an informed Rx sent for Valium '5mg'$  take 1 tab 30 minutes prior to MRI, may take second dose if needed

## 2021-03-17 NOTE — Telephone Encounter (Signed)
Rx sent for Valium 5mg  take 1 tab 30 minutes prior to MRI, may take second dose if needed.

## 2021-03-17 NOTE — Telephone Encounter (Signed)
Not quite sure what he means, what kind of website? thanks

## 2021-03-20 ENCOUNTER — Telehealth: Payer: Self-pay

## 2021-03-20 NOTE — Telephone Encounter (Signed)
Pt called no answer left a voice mail to call the office back  °

## 2021-03-20 NOTE — Telephone Encounter (Signed)
-----   Message from Cameron Sprang, MD sent at 03/20/2021  2:52 PM EDT ----- Pls let Nellie know his brain MRI looked fine, no tumor, stroke, or bleed. It showed old post-surgical changes, shunt is in position, no change from prior brain scans. Thanks  ----- Message ----- From: Leeroy Bock Sent: 03/19/2021   1:21 PM EDT To: Cameron Sprang, MD

## 2021-03-21 NOTE — Telephone Encounter (Signed)
Pt said he is returning a call to [facility] regarding results

## 2021-03-24 NOTE — Telephone Encounter (Signed)
Pt called an informed MRI of the brain looked fine, no tumor, stroke, or bleed. It showed old post-surgical changes, shunt is in position, no change from prior brain scans

## 2021-04-07 ENCOUNTER — Telehealth: Payer: Self-pay | Admitting: Neurology

## 2021-04-07 NOTE — Telephone Encounter (Signed)
Pt called in to see if aquino can add to his letter for social security. He cant work in hot places, cause it causes him to have seizures. That's one of the reasons he needs to work from home. He was wondering if she could just add that to the previous letter

## 2021-04-09 ENCOUNTER — Telehealth: Payer: Self-pay | Admitting: Neurology

## 2021-04-09 MED ORDER — LAMOTRIGINE 100 MG PO TABS: 100.0000 mg | ORAL_TABLET | Freq: Two times a day (BID) | ORAL | 3 refills | Status: DC

## 2021-04-09 NOTE — Telephone Encounter (Signed)
Pt c/o: seizure Missed medications?  No. Sleep deprived?  No. Alcohol intake?  No. Back to their usual baseline self?  Yes.  . If no, advise go to ER Any increase in stress no same normal stress Any trigger? no Current medications prescribed by Dr. Delice Lesch: lamotrigine  2 tablets twice a day Keppra 3 TABLETS BY MOUTH TWICE DAILY Zonisamide Take 6 capsules at night

## 2021-04-09 NOTE — Telephone Encounter (Signed)
Pt called in stating he had a seizure yesterday evening and hit his head. His girlfriend found him in the bathroom. They called the EMS, but he didn't go to the ED. He has 2 sores from hitting his head. One above his eye and one on the top of his head.

## 2021-04-09 NOTE — Telephone Encounter (Signed)
Pt called in wanting to find out about electrolytes and his seizures. He says he knows it's important to be hydrated. He would like to find out more about that and worries maybe he is not getting enough.

## 2021-04-09 NOTE — Telephone Encounter (Signed)
Pls let him know I will increase the Lamotrigine to 100mg : Take 1 tablet twice a day. Continue Keppra 3 tabs BID and Zonisamide 6 caps qhs. Rx sent to pharmacy. Thanks

## 2021-04-09 NOTE — Telephone Encounter (Signed)
Pt called and informed that Dr Delice Lesch increase the Lamotrigine to 100mg : Take 1 tablet twice a day. Continue Keppra 3 tabs BID and Zonisamide 6 caps qhs. Rx sent to pharmacy

## 2021-05-28 ENCOUNTER — Encounter: Payer: Self-pay | Admitting: Neurology

## 2021-06-27 ENCOUNTER — Telehealth: Payer: Self-pay | Admitting: Neurology

## 2021-06-27 NOTE — Telephone Encounter (Signed)
Filled out form and placed on Dr. Amparo Bristol desk for signature.

## 2021-06-27 NOTE — Telephone Encounter (Signed)
Patient said he would like to see about getting a handicap sticker, he has talked with [employer].he needs a letter to get this

## 2021-06-27 NOTE — Telephone Encounter (Signed)
That is fine, there is a DMV handicap form, pls ask Nira Conn, thanks

## 2021-06-27 NOTE — Telephone Encounter (Signed)
Called patient and informed him that once we get form signed he may come and pick it up. Patient is aware I will call him when it's ready to be picked up.

## 2021-07-01 ENCOUNTER — Telehealth: Payer: Self-pay | Admitting: Neurology

## 2021-07-01 NOTE — Telephone Encounter (Signed)
Pt states that he needs a letter from Dr Delice Lesch so that he can get home health aid to come to his house to help him

## 2021-07-01 NOTE — Telephone Encounter (Signed)
Pls check what kind of help he needs. Thanks

## 2021-07-01 NOTE — Telephone Encounter (Signed)
Pt stated that he needs help with cooking, and transportation. Pt was given the number for right at home to call them to get help. Pt also wants handy cap placard

## 2021-07-02 NOTE — Telephone Encounter (Signed)
Pt called in wanting to follow up with the DMV handicap forms

## 2021-07-02 NOTE — Telephone Encounter (Signed)
Pt called informed that handcap placard is upfront for pick up .

## 2021-07-02 NOTE — Telephone Encounter (Signed)
See other phone note, handicap form filled out. Thanks

## 2021-07-02 NOTE — Telephone Encounter (Signed)
I signed it, thanks!

## 2021-07-09 ENCOUNTER — Encounter: Payer: Self-pay | Admitting: Neurology

## 2021-07-09 ENCOUNTER — Other Ambulatory Visit: Payer: Self-pay

## 2021-07-09 ENCOUNTER — Ambulatory Visit: Payer: Medicaid Other | Admitting: Neurology

## 2021-07-09 VITALS — BP 122/82 | HR 77 | Ht 72.0 in | Wt 226.2 lb

## 2021-07-09 DIAGNOSIS — G40201 Localization-related (focal) (partial) symptomatic epilepsy and epileptic syndromes with complex partial seizures, not intractable, with status epilepticus: Secondary | ICD-10-CM

## 2021-07-09 DIAGNOSIS — G43001 Migraine without aura, not intractable, with status migrainosus: Secondary | ICD-10-CM

## 2021-07-09 DIAGNOSIS — G43009 Migraine without aura, not intractable, without status migrainosus: Secondary | ICD-10-CM

## 2021-07-09 DIAGNOSIS — R200 Anesthesia of skin: Secondary | ICD-10-CM

## 2021-07-09 MED ORDER — NORTRIPTYLINE HCL 50 MG PO CAPS: ORAL_CAPSULE | ORAL | 2 refills | Status: DC

## 2021-07-09 MED ORDER — LAMOTRIGINE 100 MG PO TABS: 100.0000 mg | ORAL_TABLET | Freq: Two times a day (BID) | ORAL | 3 refills | Status: DC

## 2021-07-09 MED ORDER — ZONISAMIDE 100 MG PO CAPS: ORAL_CAPSULE | ORAL | 3 refills | Status: DC

## 2021-07-09 MED ORDER — ELETRIPTAN HYDROBROMIDE 20 MG PO TABS: ORAL_TABLET | ORAL | 11 refills | Status: DC

## 2021-07-09 MED ORDER — LEVETIRACETAM 500 MG PO TABS: ORAL_TABLET | ORAL | 11 refills | Status: DC

## 2021-07-09 NOTE — Patient Instructions (Signed)
Good to see you!  Schedule EMG/NCV of both arms  2. Continue all your medications  3. Follow-up in 6 months, call for any changes   Seizure Precautions: 1. If medication has been prescribed for you to prevent seizures, take it exactly as directed.  Do not stop taking the medicine without talking to your doctor first, even if you have not had a seizure in a long time.   2. Avoid activities in which a seizure would cause danger to yourself or to others.  Don't operate dangerous machinery, swim alone, or climb in high or dangerous places, such as on ladders, roofs, or girders.  Do not drive unless your doctor says you may.  3. If you have any warning that you may have a seizure, lay down in a safe place where you can't hurt yourself.    4.  No driving for 6 months from last seizure, as per [facility].   Please refer to the following link on the [place] website for more information: http://www.epilepsyfoundation.org/answerplace/Social/driving/drivingu.cfm   5.  Maintain good sleep hygiene. Avoid alcohol.  6.  Contact your doctor if you have any problems that may be related to the medicine you are taking.  7.  Call 911 and bring the patient back to the ED if:        A.  The seizure lasts longer than 5 minutes.       B.  The patient doesn't awaken shortly after the seizure  C.  The patient has new problems such as difficulty seeing, speaking or moving  D.  The patient was injured during the seizure  E.  The patient has a temperature over 102 F (39C)  F.  The patient vomited and now is having trouble breathing

## 2021-07-09 NOTE — Progress Notes (Signed)
NEUROLOGY FOLLOW UP OFFICE NOTE  Don Reed [id number] 1985-08-10  HISTORY OF PRESENT ILLNESS: I had the pleasure of seeing Don Reed in follow-up in the neurology clinic on 07/09/2021.  The patient was last seen 4 months ago for seizures and headaches. He is alone in the office today. Records and images were personally reviewed where available.  Since his last visit, he called our office to report a seizure in 03/2021, Lamotrigine dose was increased to 100mg  BID. He is also on Levetiracetam 1500mg  BID and Zonisamide 600mg  qhs without side effects. He denies any seizures since 03/2021. The headaches are better with Emgality and nortriptyline 100mg  qhs, he is not having them frequently like before. He reports times where he feels a "daze-y" feeling similar to when he bends down and gets up too fast, but it happens if he is just walking. His ears get muffled. He closes his eyes and the symptoms resolve, but they recur all day. He is able to speak and comprehend during them. No falls. He had a brain MRI with and without contrast in 02/2021 which did not show any acute changes, there were stable postsurgical changes in the posterior fossa, most of vermis absent, mild encephalomalacia in the parasagittal portions of the cerebellar hemispheres, right frontal ventriculostomy cathter with unchanged ventricle size. He also reports numbness in both hands that occur when he flexes his elbows or rests his elbows on a table. He has noticed left hand shakiness, as well as weakness where he has trouble lifting things with his left hand. It is hard to do laundry. He needs help at home with making meals, he only uses the microwave. His vision also makes it harder to do things at home. He does not drive.    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 He states that at age 66, he was having episodes of slurred  speech, falls, recurrent syncope, and headaches, and was found to have the brain tumor. He recalls when he was younger, his head kept turning to one side and he would try to stop it. After the surgery, he did fairly well except for frequent headaches, then had his first seizure at age 41 or 1. He describes seizures as starting with back pain, then he gets a metallic taste in his mouth, his head starts hurting more, then he gets disoriented and has been told he would become unresponsive then proceed to generalized shaking, at times with urinary incontinence. He lives with his fiancee and brother, and denies any isolated staring spells. He has been told he has seizures in his sleep. After a seizure, he denies any focal weakness, he can hear people around him but has difficulty getting his words out. He also has some nausea. He has fallen twice this year with his seizures, he was in the ER on 09/05/15 and 12/02/15 for seizure, during both ER visits, he would have a witnessed seizure in the ER. With the most recent seizure on 12/02/15, he had ran out of Vimpat. He has been taking Vimpat 100mg  BID since 2015, on Keppra 1500mg  BID since 2011. He recalls taking Depakote and Topamax in the past, with cognitive side effects. He continues to report memory issues, "I can't think too good." He feels his left side is different, with numbness in his left arm and leg. He had cervical fusion surgery and has  been told this is the cause of his left-sided symptoms. His last shunt revision was in 1999. 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    He has a history of chronic headaches, with throbbing headaches occurring around twice a week over the frontal and temporal regions, with associated nausea, vomiting, photo/phonophobia. They would last for 1 to 1-1/2 days, he does not take any medication because he feels they have provoked seizures in the past. He reports taking an unrecalled headache preventative medication in [place]  which was helpful. He denies any dizziness, bowel/bladder dysfunction, no myoclonic jerks. He has occasional twitching in an arm or leg. He has chronic neck and back pain.    Epilepsy Risk Factors:  His father had generalized convulsions, his mother had seizures as well. He has a history of benign brain tumor s/p VP shunt, last revision in 1999. He reports several head injuries with loss of consciousness, most recently when hit in the face by a ball a few years ago. Otherwise he had a normal birth and early development.  There is no history of febrile convulsions, CNS infections such as meningitis/encephalitis.   Prior AEDs: Depakote, Topamax, Vimpat  Diagnostic Data: MRI brain with and without contrast 07/27/2016: no acute changes seen. There were stable postsurgical changes in the posterior fossa related to resection of previous mass. 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 No recurrent mass or abnormal enhancement seen. There is a right transfrontal ventricular shunt seen terminating within the right ventricle. Hippocami symmetric.  MRI cervical spine in 07/2017 showed moderate stenosis at C3-4, moderate foraminal encroachment right greater than left at C3-4, no change from prior MRI. There is moderate right foraminal narrowing at C4-5 and mild left foraminal narrowing at C4-5, unchanged. 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   PAST MEDICAL HISTORY: Past Medical History:  Diagnosis Date   Asthma    Cancer ([place])    Chronic back pain    History of brain shunt    History of brain tumor    Hydrocephalus ([place])    Legally blind    Migraine    Seizures ([place])    Vision loss     MEDICATIONS: Current Outpatient Medications on File Prior to Visit  Medication Sig Dispense Refill   eletriptan  (RELPAX) 20 MG tablet Take 1 tablet as needed at onset of migraine. Do not take more than 2-3 a week 10 tablet 11   Galcanezumab-gnlm (EMGALITY) 120 MG/ML SOAJ Inject 1 pen into the skin every 30 (thirty) days. 1.12 mL 11   lamoTRIgine (LAMICTAL) 100 MG tablet Take 1 tablet (100 mg total) by mouth 2 (two) times daily. 180 tablet 3   levETIRAcetam (KEPPRA) 500 MG tablet TAKE 3 TABLETS BY MOUTH TWICE DAILY 180 tablet 11   methocarbamol (ROBAXIN) 500 MG tablet Take by mouth 2 (two) times daily.     Multiple Vitamin (MULTIVITAMIN) tablet Take 1 tablet by mouth daily.     nortriptyline (PAMELOR) 50 MG capsule TAKE 2 CAPSULES BY MOUTH EVERY NIGHT 180 capsule 2   Omega-3 Fatty Acids (FISH OIL OMEGA-3) 1000 MG CAPS Take by mouth.     PROVENTIL HFA 108 (90 BASE) MCG/ACT inhaler Inhale 2 puffs into the lungs every 6 (six) hours as needed for shortness of breath.   3  zonisamide (ZONEGRAN) 100 MG capsule Take 6 capsules at night 540 capsule 3   [DISCONTINUED] diphenhydramine-acetaminophen (TYLENOL PM) 25-500 MG TABS tablet Take 2 tablets by mouth at bedtime as needed (for sleep).      No current facility-administered medications on file prior to visit.    ALLERGIES: Allergies  Allergen Reactions   Amoxicillin Itching and Rash    Has patient had a PCN reaction causing immediate rash, facial/tongue/throat swelling, SOB or lightheadedness with hypotension: Yes Has patient had a PCN reaction causing severe rash involving mucus membranes or skin necrosis: No Has patient had a PCN reaction that required hospitalization No Has patient had a PCN reaction occurring within the last 10 years: Yes If all of the above answers are "NO", then may proceed with Cephalosporin use.    Shellfish Allergy Anaphylaxis, Hives, Shortness Of Breath and Rash    Cannot breathe    Vancomycin Itching and Rash    FAMILY HISTORY: Family History  Problem Relation Age of Onset   Hypertension Mother    Seizures Mother     Cataracts Mother    Stroke Mother    Stroke Father    Seizures Father    Scoliosis Sister    Vision loss Sister    Vision loss Brother    Diabetes Brother    Leukemia Brother     SOCIAL HISTORY: Social History   Socioeconomic History   Marital status: Single    Spouse name: Not on file   Number of children: 2   Years of education: GED   Highest education level: Not on file  Occupational History    Employer: [employer]  Tobacco Use   Smoking status: Never   Smokeless tobacco: Never  Vaping Use   Vaping Use: Never used  Substance and Sexual Activity   Alcohol use: Not Currently   Drug use: No   Sexual activity: Not on file  Other Topics Concern   Not on file  Social History Narrative   Patient lives at home with family.   Caffeine Use: 1-3 time weekly   Right handed   Social Determinants of Health   Financial Resource Strain: Not on file  Food Insecurity: Not on file  Transportation Needs: Not on file  Physical Activity: Not on file  Stress: Not on file  Social Connections: Not on file  Intimate Partner Violence: Not on file     PHYSICAL EXAM: Vitals:   07/09/21 0949  BP: 122/82  Pulse: 77  SpO2: 98%   General: No acute distress Head:  Normocephalic/atraumatic Skin/Extremities: No rash, no edema Neurological Exam: alert and awake. No aphasia or dysarthria. Fund of knowledge is appropriate.  Attention and concentration are normal.   Cranial nerves: Pupils equal, round. Extraocular movements intact nystagmus on primary gaze, gaze-evoked nystagmus on all directions. Visual fields full.  No facial asymmetry.  Motor: Bulk and tone normal, muscle strength 5/5 throughout except for 4/5 left APB. No pronator drift. Sensation intact to temperature. Finger to nose testing intact.  Gait narrow-based and steady, no ataxia. Negative Tinel sign at wrist and elbow. 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 EEG normal, MRI brain in 02/2021 showed no acute changes with postsurgical posterior fossa  changes seen, right transfrontal shunt in place. He has had improvement in seizures with addition of Lamotrigine 100mg  BID to Levetiracetam 1500mg  BID and Zonisamide 600mg  qhs, last seizure 03/2021. Migraines improved as well on Emgality and nortriptyline 100mg  qhs. He has prn Relpax for rescue. Refills sent. He is reporting left hand tremors (?weakness) and numbness in both arms, L>R, EMG/NCV of both UE will be ordered to further evaluate his symptoms. He does not drive. Follow-up in 6 months, call for any changes.     Thank you for allowing me to participate in his care.  Please do not hesitate to call for any questions or concerns.    Ellouise Newer, M.D.   CC: Dr. Criss Rosales

## 2021-07-18 ENCOUNTER — Telehealth: Payer: Self-pay | Admitting: Neurology

## 2021-07-18 NOTE — Telephone Encounter (Signed)
Ulla Potash from [facility] called and said the patient requested she call and tell the office a home health referral place of service for a PA can be found by calling [phone].   She did not have any other detail to share. FYI only.

## 2021-07-18 NOTE — Telephone Encounter (Signed)
[facility] called they have no PA started and are unsure what pt may have been talking about

## 2021-07-23 ENCOUNTER — Telehealth: Payer: Self-pay | Admitting: Neurology

## 2021-07-23 NOTE — Telephone Encounter (Signed)
Patient called wanting to know the status of his paperwork.  He stated that the name of the agency has to be on the paperwork before it is sent.  The agency name is [facility].  Phone number [phone].  He also stated that his left hand shaking seems to be getting worse.

## 2021-07-28 ENCOUNTER — Telehealth: Payer: Self-pay | Admitting: Neurology

## 2021-07-28 NOTE — Telephone Encounter (Signed)
Done, ready to fax, thanks

## 2021-07-28 NOTE — Telephone Encounter (Signed)
Patient called to follow up about home health care.  He received a call from [facility] that told him to call the office to check on the status of the request.  He'd like to be sure the agency is listed on the paperwork that needed completed: [facility].  Phone number to [place]: [phone]

## 2021-07-28 NOTE — Telephone Encounter (Signed)
Pt would like to know the status of home health care authorization. The agency name is [facility].  Phone number [phone].

## 2021-07-28 NOTE — Telephone Encounter (Signed)
Done, thanks

## 2021-07-28 NOTE — Telephone Encounter (Signed)
Pt work up front. Front staff will call pt to have him pay for having forms filled out, will fax to [facility]: [phone]

## 2021-07-29 DIAGNOSIS — Z0279 Encounter for issue of other medical certificate: Secondary | ICD-10-CM

## 2021-07-29 NOTE — Telephone Encounter (Signed)
Paperwork mailed to pt and faxed to home health

## 2021-07-29 NOTE — Telephone Encounter (Signed)
Patient unable to read credit card information by himself. He will call back later today to pay form fee.

## 2021-08-01 ENCOUNTER — Telehealth: Payer: Self-pay | Admitting: Neurology

## 2021-08-01 NOTE — Telephone Encounter (Signed)
Pt called in stating the paperwork that was faxed in to the home health agency on 07/29/21 needed to be faxed to his insurance [employer].

## 2021-08-04 NOTE — Telephone Encounter (Signed)
Pt called advised that home health takes care of their PA's we do the paperwork and send it to them and if notes are needed we will send them in ,

## 2021-08-12 ENCOUNTER — Encounter: Payer: Medicaid Other | Admitting: Neurology

## 2021-08-18 ENCOUNTER — Telehealth: Payer: Self-pay | Admitting: Neurology

## 2021-08-18 NOTE — Telephone Encounter (Signed)
Personal care paperwork faxed to [facility]  [phone]

## 2021-08-18 NOTE — Telephone Encounter (Signed)
[employer] medicaid, Don Reed, called about forms competed for personal care services. She'd like to know if these forms have been completed yet?  If so, please sent to number below.  Fax: [phone]

## 2021-09-15 ENCOUNTER — Telehealth: Payer: Self-pay | Admitting: Neurology

## 2021-09-15 NOTE — Telephone Encounter (Signed)
Pt called in wanting to find out if it would be ok for him to try CBD Oil to help with his sciatic pain? Also he is curious what all else CBD oil can help with? ?

## 2021-09-15 NOTE — Telephone Encounter (Signed)
Pt called an informed that Dr Delice Lesch does not prescribe CBD oil for sciatic pain, she does not recommend it for over the counter use. For sciatic pain, physical therapy is the mainstay. There is prescription CBD for seizures but this is usually in patients who are having multiple seizures a week on several medications. Pt verbalized understanding  ?

## 2021-09-15 NOTE — Telephone Encounter (Signed)
I do not prescribe CBD oil for sciatic pain, I do not recommend it for over the counter use. For sciatic pain, physical therapy is the mainstay. There is prescription CBD for seizures but this is usually in patients who are having multiple seizures a week on several medications.  ?

## 2021-09-18 ENCOUNTER — Ambulatory Visit: Payer: Medicaid Other | Admitting: Neurology

## 2021-09-18 ENCOUNTER — Other Ambulatory Visit: Payer: Self-pay

## 2021-09-18 DIAGNOSIS — R200 Anesthesia of skin: Secondary | ICD-10-CM

## 2021-09-18 DIAGNOSIS — G40201 Localization-related (focal) (partial) symptomatic epilepsy and epileptic syndromes with complex partial seizures, not intractable, with status epilepticus: Secondary | ICD-10-CM | POA: Diagnosis not present

## 2021-09-18 DIAGNOSIS — G5621 Lesion of ulnar nerve, right upper limb: Secondary | ICD-10-CM

## 2021-09-18 DIAGNOSIS — G43009 Migraine without aura, not intractable, without status migrainosus: Secondary | ICD-10-CM

## 2021-09-18 NOTE — Procedures (Signed)
[facility]  ?[address] ? [place], [place] [address] ?Tel: [phone] ?Fax:  [phone] ?Test Date:  09/18/2021 ? ?Patient: Don Reed DOB: [birth year] Physician: Narda Amber, DO  ?Sex: Male Height: 6' " Ref Phys: Ellouise Newer, M.D.  ?ID#: [id number]   Technician:   ? ?Patient Complaints: ?This is a 36 year old man referred for evaluation of bilateral arm paresthesias. ? ?NCV & EMG Findings: ?Extensive electrodiagnostic testing of the right upper extremity and additional studies of the left shows:  ?Bilateral median, bilateral mixed palmar, and left ulnar sensory responses are within normal limits.  Right ulnar sensory response shows mildly prolonged latency (3.2 ms).   ?Bilateral median and left ulnar motor responses are within normal limits.  Right ulnar motor response shows slowed conduction velocity across the elbow (A Elbow-B Elbow, 43 m/s).   ?There is no evidence of active or chronic motor axonal loss changes affecting any of the tested muscles.  Motor unit configuration and recruitment pattern is within normal limits. ? ?Impression: ?Right ulnar neuropathy with slowing across the elbow, demyelinating, mild. ?There is no evidence of carpal tunnel syndrome affecting either upper extremity or left ulnar neuropathy. ? ?___________________________ ?Narda Amber, DO ? ? ? ?Nerve Conduction Studies ?Anti Sensory Summary Table ? ? Stim Site NR Peak (ms) Norm Peak (ms) P-T Amp (?V) Norm P-T Amp  ?Left Median Anti Sensory (2nd Digit)  34?C  ?Wrist    3.2 <3.4 51.7 >20  ?Right Median Anti Sensory (2nd Digit)  34?C  ?Wrist    3.2 <3.4 57.5 >20  ?Left Ulnar Anti Sensory (5th Digit)  34?C  ?Wrist    3.1 <3.1 35.8 >12  ?Right Ulnar Anti Sensory (5th Digit)  34?C  ?Wrist    3.2 <3.1 28.2 >12  ? ?Motor Summary Table ? ? Stim Site NR Onset (ms) Norm Onset (ms) O-P Amp (mV) Norm O-P Amp Site1 Site2 Delta-0 (ms) Dist (cm) Vel (m/s) Norm Vel (m/s)  ?Left Median Motor (Abd Poll Brev)  34?C   ?Wrist    3.4 <3.9 11.3 >6 Elbow Wrist 5.7 34.0 60 >50  ?Elbow    9.1  10.9         ?Right Median Motor (Abd Poll Brev)  34?C  ?Wrist    3.2 <3.9 9.9 >6 Elbow Wrist 5.4 32.0 59 >50  ?Elbow    8.6  9.3         ?Left Ulnar Motor (Abd Dig Minimi)  34?C  ?Wrist    3.0 <3.1 9.9 >7 B Elbow Wrist 4.5 26.0 58 >50  ?B Elbow    7.5  9.8  A Elbow B Elbow 1.9 10.0 53 >50  ?A Elbow    9.4  9.8         ?Right Ulnar Motor (Abd Dig Minimi)  34?C  ?Wrist    2.7 <3.1 10.8 >7 B Elbow Wrist 4.8 26.0 54 >50  ?B Elbow    7.5  7.8  A Elbow B Elbow 2.3 10.0 43 >50  ?A Elbow    9.8  7.5         ? ?Comparison Summary Table ? ? Stim Site NR Peak (ms) Norm Peak (ms) P-T Amp (?V) Site1 Site2 Delta-P (ms) Norm Delta (ms)  ?Left Median/Ulnar Palm Comparison (Wrist - 8cm)  34?C  ?Median Palm    1.6 <2.2 58.1 Median Palm Ulnar Palm 0.2   ?Ulnar Palm    1.8 <2.2 19.2      ?Right  Median/Ulnar Palm Comparison (Wrist - 8cm)  34?C  ?Median Palm    1.7 <2.2 66.0 Median Palm Ulnar Palm 0.1   ?Ulnar Palm    1.8 <2.2 13.0      ? ?EMG ? ? Side Muscle Ins Act Fibs Psw Fasc Number Recrt Dur Dur. Amp Amp. Poly Poly. Comment  ?Right 1stDorInt Nml Nml Nml Nml Nml Nml Nml Nml Nml Nml Nml Nml N/A  ?Right PronatorTeres Nml Nml Nml Nml Nml Nml Nml Nml Nml Nml Nml Nml N/A  ?Right Biceps Nml Nml Nml Nml Nml Nml Nml Nml Nml Nml Nml Nml N/A  ?Right Triceps Nml Nml Nml Nml Nml Nml Nml Nml Nml Nml Nml Nml N/A  ?Right Deltoid Nml Nml Nml Nml Nml Nml Nml Nml Nml Nml Nml Nml N/A  ?Right ABD Dig Min Nml Nml Nml Nml Nml Nml Nml Nml Nml Nml Nml Nml N/A  ?Right FlexCarpiUln Nml Nml Nml Nml Nml Nml Nml Nml Nml Nml Nml Nml N/A  ?Left 1stDorInt Nml Nml Nml Nml Nml Nml Nml Nml Nml Nml Nml Nml N/A  ?Left PronatorTeres Nml Nml Nml Nml Nml Nml Nml Nml Nml Nml Nml Nml N/A  ?Left Biceps Nml Nml Nml Nml Nml Nml Nml Nml Nml Nml Nml Nml N/A  ?Left Triceps Nml Nml Nml Nml Nml Nml Nml Nml Nml Nml Nml Nml N/A  ?Left Deltoid Nml Nml Nml Nml Nml Nml Nml Nml Nml Nml Nml Nml N/A   ? ? ? ? ?Waveforms: ?    ? ?    ? ?    ? ?  ? ? ?

## 2021-09-23 ENCOUNTER — Telehealth: Payer: Self-pay

## 2021-09-23 ENCOUNTER — Telehealth: Payer: Self-pay | Admitting: Neurology

## 2021-09-23 NOTE — Telephone Encounter (Signed)
See other phone note

## 2021-09-23 NOTE — Telephone Encounter (Signed)
-----   Message from Cameron Sprang, MD sent at 09/22/2021  2:30 PM EDT ----- ?Pls let him know the nerve test did not show any abnormality on the left side. There is a mild pinched nerve at the right elbow. Usually the treatment is wearing a brace on the elbow to prevent worsening. Thanks ?

## 2021-09-23 NOTE — Telephone Encounter (Signed)
Pt called an informed the nerve test did not show any abnormality on the left side. There is a mild pinched nerve at the right elbow. Usually the treatment is wearing a brace on the elbow to prevent worsening. Pt advised he can get the brace at [facility] or the pharmacy  ?

## 2021-09-23 NOTE — Telephone Encounter (Signed)
Patient called for her EMG results from 09/18/21. ?

## 2021-10-31 ENCOUNTER — Telehealth: Payer: Self-pay | Admitting: Neurology

## 2021-10-31 NOTE — Telephone Encounter (Signed)
Pt called in wanting to see if he could switch his current seizure medication to CBD. He thinks he might have to slowly transition. ?

## 2021-11-03 NOTE — Telephone Encounter (Signed)
Pls let him know that we will have to discuss this in the office, prescription CBD for seizures is not without side effects, it can affect the liver and will need bloodwork for liver tests every 1,3, 6 months from starting. Will have to discuss in office if this is an option for him. Thanks ?

## 2021-11-03 NOTE — Telephone Encounter (Signed)
Pt called an informed that he and Dr Delice Lesch will have to discuss this in the office, prescription CBD for seizures is not without side effects, it can affect the liver and will need bloodwork for liver tests every 1,3, 6 months from starting. Will have to discuss in office if this is an option for him. ?

## 2022-01-08 ENCOUNTER — Ambulatory Visit: Payer: Medicaid Other | Admitting: Neurology

## 2022-01-13 ENCOUNTER — Ambulatory Visit: Payer: Medicaid Other | Admitting: Neurology

## 2022-01-13 ENCOUNTER — Encounter: Payer: Self-pay | Admitting: Neurology

## 2022-01-13 VITALS — BP 123/77 | HR 97 | Ht 73.0 in | Wt 236.0 lb

## 2022-01-13 DIAGNOSIS — G40201 Localization-related (focal) (partial) symptomatic epilepsy and epileptic syndromes with complex partial seizures, not intractable, with status epilepticus: Secondary | ICD-10-CM | POA: Diagnosis not present

## 2022-01-13 DIAGNOSIS — G43001 Migraine without aura, not intractable, with status migrainosus: Secondary | ICD-10-CM

## 2022-01-13 DIAGNOSIS — M5386 Other specified dorsopathies, lumbar region: Secondary | ICD-10-CM | POA: Diagnosis not present

## 2022-01-13 MED ORDER — NORTRIPTYLINE HCL 50 MG PO CAPS: ORAL_CAPSULE | ORAL | 2 refills | Status: DC

## 2022-01-13 MED ORDER — LAMOTRIGINE 100 MG PO TABS: 100.0000 mg | ORAL_TABLET | Freq: Two times a day (BID) | ORAL | 3 refills | Status: DC

## 2022-01-13 MED ORDER — ZONISAMIDE 100 MG PO CAPS: ORAL_CAPSULE | ORAL | 3 refills | Status: DC

## 2022-01-13 MED ORDER — ELETRIPTAN HYDROBROMIDE 20 MG PO TABS: ORAL_TABLET | ORAL | 11 refills | Status: DC

## 2022-01-13 MED ORDER — LEVETIRACETAM 500 MG PO TABS: ORAL_TABLET | ORAL | 11 refills | Status: DC

## 2022-01-13 NOTE — Progress Notes (Unsigned)
NEUROLOGY FOLLOW UP OFFICE NOTE  Don Reed [id number] May 08, 1986  HISTORY OF PRESENT ILLNESS: I had the pleasure of seeing Don Reed in follow-up in the neurology clinic on 01/13/2022.  The patient was last seen on *** and is accompanied by *** today.  Records and images were personally reviewed where available.  ***.  Last sz was in March Stopped emgality 2 mos ago bec not having HAs Last HA was May, none from May to now Chiropractor for his sciatic nerve, not getting better, when he lays down, eases up, takes time for pain to go away and he can sleep, wakes up in middle of night due to pain, then when he gets up it starts again Back pain, going down right leg, around side of knee; more recently feeling also on the left leg When he stretches it pulls and hurts on the groin When he uriantes, takes a little time to come out; also with BM, no incontinence Right arm still hurts, shows on the inside of arm, sleeve helps Last fall was in March   I had the pleasure of seeing Don Reed in follow-up in the neurology clinic on 07/09/2021.  The patient was last seen 4 months ago for seizures and headaches. He is alone in the office today. Records and images were personally reviewed where available.  Since his last visit, he called our office to report a seizure in 03/2021, Lamotrigine dose was increased to '100mg'$  BID. He is also on Levetiracetam '1500mg'$  BID and Zonisamide '600mg'$  qhs without side effects. He denies any seizures since 03/2021. The headaches are better with Emgality and nortriptyline '100mg'$  qhs, he is not having them frequently like before. He reports times where he feels a "daze-y" feeling similar to when he bends down and gets up too fast, but it happens if he is just walking. His ears get muffled. He closes his eyes and the symptoms resolve, but they recur all day. He is able to speak and comprehend during them. No falls. He had a brain MRI with and without contrast in  02/2021 which did not show any acute changes, there were stable postsurgical changes in the posterior fossa, most of vermis absent, mild encephalomalacia in the parasagittal portions of the cerebellar hemispheres, right frontal ventriculostomy cathter with unchanged ventricle size. He also reports numbness in both hands that occur when he flexes his elbows or rests his elbows on a table. He has noticed left hand shakiness, as well as weakness where he has trouble lifting things with his left hand. It is hard to do laundry. He needs help at home with making meals, he only uses the microwave. His vision also makes it harder to do things at home. He does not drive.    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 He states that at age 6, he was having episodes of slurred speech, falls, recurrent syncope, and headaches, and was found to have the brain tumor. He recalls when he was younger, his head kept turning to one side and he would try to stop it. After the surgery, he did fairly well except for frequent headaches, then had his first seizure at age 36 or 25. He describes seizures as starting with back pain, then he gets a metallic taste in his mouth, his head starts hurting more, then he gets disoriented and has been told he  would become unresponsive then proceed to generalized shaking, at times with urinary incontinence. He lives with his fiancee and brother, and denies any isolated staring spells. He has been told he has seizures in his sleep. After a seizure, he denies any focal weakness, he can hear people around him but has difficulty getting his words out. He also has some nausea. He has fallen twice this year with his seizures, he was in the ER on 09/05/15 and 12/02/15 for seizure, during both ER visits, he would have a witnessed seizure in the ER. With the most recent seizure on 12/02/15, he had ran out of Vimpat. He  has been taking Vimpat '100mg'$  BID since 2015, on Keppra '1500mg'$  BID since 2011. He recalls taking Depakote and Topamax in the past, with cognitive side effects. He continues to report memory issues, "I can't think too good." He feels his left side is different, with numbness in his left arm and leg. He had cervical fusion surgery and has been told this is the cause of his left-sided symptoms. His last shunt revision was in 1999. 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    He has a history of chronic headaches, with throbbing headaches occurring around twice a week over the frontal and temporal regions, with associated nausea, vomiting, photo/phonophobia. They would last for 1 to 1-1/2 days, he does not take any medication because he feels they have provoked seizures in the past. He reports taking an unrecalled headache preventative medication in [place] which was helpful. He denies any dizziness, bowel/bladder dysfunction, no myoclonic jerks. He has occasional twitching in an arm or leg. He has chronic neck and back pain.    Epilepsy Risk Factors:  His father had generalized convulsions, his mother had seizures as well. He has a history of benign brain tumor s/p VP shunt, last revision in 1999. He reports several head injuries with loss of consciousness, most recently when hit in the face by a ball a few years ago. Otherwise he had a normal birth and early development.  There is no history of febrile convulsions, CNS infections such as meningitis/encephalitis.   Prior AEDs: Depakote, Topamax, Vimpat  Diagnostic Data: MRI brain with and without contrast 07/27/2016: no acute changes seen. There were stable postsurgical changes in the posterior fossa related to resection of previous mass. 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 No recurrent mass or abnormal enhancement seen. There is a right transfrontal ventricular shunt seen  terminating within the right ventricle. Hippocami symmetric.  MRI cervical spine in 07/2017 showed moderate stenosis at C3-4, moderate foraminal encroachment right greater than left at C3-4, no change from prior MRI. There is moderate right foraminal narrowing at C4-5 and mild left foraminal narrowing at C4-5, unchanged. 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    PAST MEDICAL HISTORY: Past Medical History:  Diagnosis Date   Asthma    Cancer ([place])    Chronic back pain    History of brain shunt    History of brain tumor    Hydrocephalus ([place])    Legally blind    Migraine    Seizures ([place])    Vision loss     MEDICATIONS: Current Outpatient Medications on File Prior to Visit  Medication Sig Dispense Refill   eletriptan (RELPAX) 20 MG tablet Take  1 tablet as needed at onset of migraine. Do not take more than 2-3 a week 10 tablet 11   lamoTRIgine (LAMICTAL) 100 MG tablet Take 1 tablet (100 mg total) by mouth 2 (two) times daily. 180 tablet 3   levETIRAcetam (KEPPRA) 500 MG tablet TAKE 3 TABLETS BY MOUTH TWICE DAILY 180 tablet 11   methocarbamol (ROBAXIN) 500 MG tablet Take by mouth 2 (two) times daily.     Multiple Vitamin (MULTIVITAMIN) tablet Take 1 tablet by mouth daily.     nortriptyline (PAMELOR) 50 MG capsule TAKE 2 CAPSULES BY MOUTH EVERY NIGHT 180 capsule 2   PROVENTIL HFA 108 (90 BASE) MCG/ACT inhaler Inhale 2 puffs into the lungs every 6 (six) hours as needed for shortness of breath.   3   zonisamide (ZONEGRAN) 100 MG capsule Take 6 capsules at night 540 capsule 3   Galcanezumab-gnlm (EMGALITY) 120 MG/ML SOAJ Inject 1 pen into the skin every 30 (thirty) days. (Patient not taking: Reported on 01/13/2022) 1.12 mL 11   Omega-3 Fatty Acids (FISH OIL OMEGA-3) 1000 MG CAPS Take by mouth. (Patient not taking: Reported on 01/13/2022)     [DISCONTINUED]  diphenhydramine-acetaminophen (TYLENOL PM) 25-500 MG TABS tablet Take 2 tablets by mouth at bedtime as needed (for sleep).      No current facility-administered medications on file prior to visit.    ALLERGIES: Allergies  Allergen Reactions   Amoxicillin Itching and Rash    Has patient had a PCN reaction causing immediate rash, facial/tongue/throat swelling, SOB or lightheadedness with hypotension: Yes Has patient had a PCN reaction causing severe rash involving mucus membranes or skin necrosis: No Has patient had a PCN reaction that required hospitalization No Has patient had a PCN reaction occurring within the last 10 years: Yes If all of the above answers are "NO", then may proceed with Cephalosporin use.    Shellfish Allergy Anaphylaxis, Hives, Shortness Of Breath and Rash    Cannot breathe    Vancomycin Itching and Rash    FAMILY HISTORY: Family History  Problem Relation Age of Onset   Hypertension Mother    Seizures Mother    Cataracts Mother    Stroke Mother    Stroke Father    Seizures Father    Scoliosis Sister    Vision loss Sister    Vision loss Brother    Diabetes Brother    Leukemia Brother     SOCIAL HISTORY: Social History   Socioeconomic History   Marital status: Single    Spouse name: Not on file   Number of children: 2   Years of education: GED   Highest education level: Not on file  Occupational History    Employer: [employer]  Tobacco Use   Smoking status: Never   Smokeless tobacco: Never  Vaping Use   Vaping Use: Never used  Substance and Sexual Activity   Alcohol use: Not Currently   Drug use: No   Sexual activity: Not on file  Other Topics Concern   Not on file  Social History Narrative   Patient lives at home with family.   Caffeine Use: 1-3 time weekly   Right handed   Social Determinants of Health   Financial Resource Strain: Not on file  Food Insecurity: Not on file  Transportation Needs: Not on file  Physical  Activity: Not on file  Stress: Not on file  Social Connections: Not on file  Intimate Partner Violence: Not on file     PHYSICAL  EXAM: Vitals:   01/13/22 1604  BP: 123/77  Pulse: 97  SpO2: 98%   General: No acute distress Head:  Normocephalic/atraumatic Skin/Extremities: No rash, no edema Neurological Exam: alert and oriented to person, place, and time. No aphasia or dysarthria. Fund of knowledge is appropriate.  Recent and remote memory are intact.  Attention and concentration are normal.   Cranial nerves: Pupils equal, round. Extraocular movements intact with no nystagmus. Visual fields full.  No facial asymmetry.  Motor: Bulk and tone normal, muscle strength 5/5 throughout with no pronator drift.   Finger to nose testing intact.  Gait narrow-based and steady, able to tandem walk adequately.  Romberg negative.   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 EEG normal, MRI brain in 02/2021 showed no acute changes with postsurgical posterior fossa changes seen, right transfrontal shunt in place. He has had improvement in seizures with addition of Lamotrigine '100mg'$  BID to Levetiracetam '1500mg'$  BID and Zonisamide '600mg'$  qhs, last seizure 03/2021. Migraines improved as well on Emgality and nortriptyline '100mg'$  qhs. He has prn Relpax for rescue. Refills sent. He is reporting left hand tremors (?weakness) and numbness in both arms, L>R, EMG/NCV of both UE will be ordered to further evaluate his symptoms. He does not drive. Follow-up in 6 months, call for any changes.      Thank you for allowing me to participate in *** care.  Please do not hesitate to call for any questions or concerns.  The duration of this appointment visit was *** minutes of face-to-face time with the patient.  Greater than 50% of this time  was spent in counseling, explanation of diagnosis, planning of further management, and coordination of care.   Ellouise Newer, M.D.   CC: ***

## 2022-01-13 NOTE — Patient Instructions (Signed)
Good to see you.  Have bloodwork done for CBC, CMP  2. We will to to get the Epidiolex (CBD) approved  3. Continue all your other medications  4. Follow-up in 6 months, call for any changes   Seizure Precautions: 1. If medication has been prescribed for you to prevent seizures, take it exactly as directed.  Do not stop taking the medicine without talking to your doctor first, even if you have not had a seizure in a long time.   2. Avoid activities in which a seizure would cause danger to yourself or to others.  Don't operate dangerous machinery, swim alone, or climb in high or dangerous places, such as on ladders, roofs, or girders.  Do not drive unless your doctor says you may.  3. If you have any warning that you may have a seizure, lay down in a safe place where you can't hurt yourself.    4.  No driving for 6 months from last seizure, as per [facility].   Please refer to the following link on the [place] website for more information: http://www.epilepsyfoundation.org/answerplace/Social/driving/drivingu.cfm   5.  Maintain good sleep hygiene. Avoid alcohol.  6.  Contact your doctor if you have any problems that may be related to the medicine you are taking.  7.  Call 911 and bring the patient back to the ED if:        A.  The seizure lasts longer than 5 minutes.       B.  The patient doesn't awaken shortly after the seizure  C.  The patient has new problems such as difficulty seeing, speaking or moving  D.  The patient was injured during the seizure  E.  The patient has a temperature over 102 F (39C)  F.  The patient vomited and now is having trouble breathing

## 2022-01-14 ENCOUNTER — Telehealth (HOSPITAL_COMMUNITY): Payer: Self-pay | Admitting: Pharmacy Technician

## 2022-01-14 NOTE — Telephone Encounter (Signed)
Patient Advocate Encounter   Received notification that prior authorization for Eletriptan Hydrobromide '20MG'$  tablets is required.   PA submitted on 01/14/2022 Key [id number] Status is pending       Lyndel Safe, [place] Patient Advocate Specialist [place] Patient Advocate Team Direct Number: [phone]  Fax: [phone]

## 2022-01-15 NOTE — Telephone Encounter (Signed)
Patient Advocate Encounter  Received notification that the request for prior authorization for Eletriptan Hydrobromide '20MG'$  tablets has been denied due to Per your health plan's criteria, this drug is covered if you meet the following: You have tried two preferred drugs: rizatriptan and sumatriptan.        Lyndel Safe, [place] Patient Advocate Specialist [place] Patient Advocate Team Direct Number: [phone]  Fax: [phone]

## 2022-01-15 NOTE — Telephone Encounter (Signed)
Pt called he is not having headaches we will hold off on this at this time as Dr Delice Lesch stated

## 2022-01-15 NOTE — Telephone Encounter (Signed)
He told me he is not having headaches. Does he still want refills for this? If not, we can just hold off. thanks

## 2022-01-27 ENCOUNTER — Other Ambulatory Visit (INDEPENDENT_AMBULATORY_CARE_PROVIDER_SITE_OTHER): Payer: Medicaid Other

## 2022-01-27 DIAGNOSIS — G40201 Localization-related (focal) (partial) symptomatic epilepsy and epileptic syndromes with complex partial seizures, not intractable, with status epilepticus: Secondary | ICD-10-CM

## 2022-01-27 DIAGNOSIS — G43001 Migraine without aura, not intractable, with status migrainosus: Secondary | ICD-10-CM

## 2022-01-27 LAB — COMPREHENSIVE METABOLIC PANEL
ALT: 28 U/L (ref 0–53)
AST: 17 U/L (ref 0–37)
Albumin: 5 g/dL (ref 3.5–5.2)
Alkaline Phosphatase: 63 U/L (ref 39–117)
BUN: 17 mg/dL (ref 6–23)
CO2: 25 mEq/L (ref 19–32)
Calcium: 9.9 mg/dL (ref 8.4–10.5)
Chloride: 105 mEq/L (ref 96–112)
Creatinine, Ser: 1.86 mg/dL — ABNORMAL HIGH (ref 0.40–1.50)
GFR: 45.79 mL/min — ABNORMAL LOW (ref >60.00)
Glucose, Bld: 111 mg/dL — ABNORMAL HIGH (ref 70–99)
Potassium: 4.2 mEq/L (ref 3.5–5.1)
Sodium: 140 mEq/L (ref 135–145)
Total Bilirubin: 0.3 mg/dL (ref 0.2–1.2)
Total Protein: 8.3 g/dL (ref 6.0–8.3)

## 2022-01-27 LAB — CBC
HCT: 42.1 % (ref 39.0–52.0)
Hemoglobin: 13.6 g/dL (ref 13.0–17.0)
MCHC: 32.2 g/dL (ref 30.0–36.0)
MCV: 81.3 fl (ref 78.0–100.0)
Platelets: 231 10*3/uL (ref 150.0–400.0)
RBC: 5.18 Mil/uL (ref 4.22–5.81)
RDW: 14.5 % (ref 11.5–15.5)
WBC: 6.4 10*3/uL (ref 4.0–10.5)

## 2022-01-28 ENCOUNTER — Telehealth: Payer: Self-pay

## 2022-01-28 MED ORDER — EPIDIOLEX 100 MG/ML PO SOLN
ORAL | 5 refills | Status: DC
Start: 2022-01-28 — End: 2022-05-27

## 2022-01-28 NOTE — Telephone Encounter (Signed)
Pt called an informed that the bloodwork showed normal liver function, we can start the Epidiolex, Dr Delice Lesch sent in the prescription to the specialty pharmacy. He will start at 2.38m twice a day. He needs to follow-up with his PCP for his kidney function which is worse than last time, pls confirm PCP and fax bloodwork results. Pls  increase water intake. Labs faxed to PCP

## 2022-01-28 NOTE — Telephone Encounter (Signed)
-----   Message from Cameron Sprang, MD sent at 01/28/2022  2:38 PM EDT ----- Pls let him know the bloodwork showed normal liver function, we can start the Epidiolex, I sent in the prescription to the specialty pharmacy. He will start at 2.7m twice a day. He needs to follow-up with his PCP for his kidney function which is worse than last time, pls confirm PCP and fax bloodwork results. Pls have him increase water intake, thanks

## 2022-01-30 ENCOUNTER — Telehealth (HOSPITAL_COMMUNITY): Payer: Self-pay | Admitting: Pharmacy Technician

## 2022-01-30 NOTE — Telephone Encounter (Signed)
Patient Advocate Encounter   Received notification that prior authorization for Epidiolex '100MG'$ /ML solution is required.   PA submitted on 01/30/2022 Key [id number] Status is pending       Lyndel Safe, [place] Patient Advocate Specialist [place] Patient Advocate Team Direct Number: [phone]  Fax: [phone]

## 2022-02-02 NOTE — Telephone Encounter (Signed)
Patient Advocate Encounter  Received notification that the request for prior authorization for Epidiolex '100MG'$ /ML solution  has been denied due to  Per your health plan's criteria, this drug is covered if you meet the following: You have a seizure disorder (seizures associated with Lennox Gastaut syndrome, Dravet syndrome or tuberous sclerosis complex). The information provided does not show that you meet the criteria listed above.Lyndel Safe, [place] Patient Advocate Specialist [place] Patient Advocate Team Direct Number: [phone]  Fax: [phone]

## 2022-02-05 ENCOUNTER — Ambulatory Visit: Payer: Medicaid Other | Admitting: Audiology

## 2022-02-10 ENCOUNTER — Telehealth: Payer: Self-pay | Admitting: Neurology

## 2022-02-10 NOTE — Telephone Encounter (Signed)
From PA team  We are working on the [place].  Insurance denied the PA and we are working on appealing it.

## 2022-02-10 NOTE — Telephone Encounter (Signed)
Patient called and stated he needs prior auth for the epidiolex.

## 2022-02-10 NOTE — Telephone Encounter (Signed)
Sent  to Rx PA team

## 2022-02-12 ENCOUNTER — Telehealth: Payer: Self-pay

## 2022-02-12 NOTE — Telephone Encounter (Signed)
Received a call from [employer] that the patients appeal has been overturned and he is good to pick up his medication Epidiolex at the pharmacy  PA# [id number] Approved august 16,2023-August 16 ,2024  Called patient and left voicemail that he is good to pick up his medication at the pharmacy

## 2022-02-17 NOTE — Telephone Encounter (Signed)
See other phone note, medication approved.

## 2022-02-24 ENCOUNTER — Ambulatory Visit: Payer: Medicaid Other | Admitting: Orthopaedic Surgery

## 2022-03-03 ENCOUNTER — Ambulatory Visit: Payer: Medicaid Other | Admitting: Orthopaedic Surgery

## 2022-03-03 ENCOUNTER — Telehealth: Payer: Self-pay | Admitting: Neurology

## 2022-03-03 MED ORDER — EMGALITY 120 MG/ML ~~LOC~~ SOAJ: 1.0000 "pen " | SUBCUTANEOUS | 11 refills | Status: DC

## 2022-03-03 NOTE — Telephone Encounter (Signed)
No answer at 2:30pm

## 2022-03-03 NOTE — Telephone Encounter (Signed)
Patient  states that he is having headaches again and would like the medication called into the [facility] on [address]. He states that he was not sure of the name of the medication and the injection that DR Delice Lesch had him on

## 2022-03-03 NOTE — Telephone Encounter (Signed)
Pls let him know I sent in refills to [facility] on [address] for [employer] injection every month, thanks

## 2022-03-03 NOTE — Telephone Encounter (Signed)
Emgality is the medication requesting, please advise

## 2022-04-20 ENCOUNTER — Telehealth: Payer: Self-pay

## 2022-04-20 ENCOUNTER — Other Ambulatory Visit (HOSPITAL_COMMUNITY): Payer: Self-pay

## 2022-04-20 NOTE — Telephone Encounter (Signed)
Patient Advocate Encounter   Received notification that prior authorization for Eletriptan Hydrobromide '20MG'$  tablets is required/requested.   PA submitted on 04/20/2022 to [employer] via CoverMyMeds [place]  [id number] Status is pending

## 2022-04-21 ENCOUNTER — Other Ambulatory Visit: Payer: Self-pay | Admitting: Neurology

## 2022-04-21 MED ORDER — RIZATRIPTAN BENZOATE 10 MG PO TABS: ORAL_TABLET | ORAL | 11 refills | Status: DC

## 2022-04-21 NOTE — Telephone Encounter (Signed)
patient called an informed that  insurance will not approve eletriptan/Relpax. They want him to try the rizatriptan/Maxalt first. Dr Delice Lesch sent in a prescription. Pls let him know he can take 1 tab at onset of migraine, do not take more than 3 a week.

## 2022-04-21 NOTE — Telephone Encounter (Signed)
Received a fax from [place] regarding Prior Authorization for Eletriptan Hydrobromide '20MG'$  tablets.   Authorization has been DENIED due to Per your health plan's criteria, this drug is covered if you meet the following: You have tried two preferred drugs: rizatriptan tablet / ODT (oral dissolving tablet) (generic for Maxalt) and sumatriptan nasal spray / tablet / vial (generic for Imitrex). The information provided does not show that you meet the criteria listed above. Marland Kitchen

## 2022-04-21 NOTE — Telephone Encounter (Signed)
Pls let patient know that insurance will not approve eletriptan/Relpax. They want him to try the rizatriptan/Maxalt first. I sent in a prescription. Pls let him know he can take 1 tab at onset of migraine, do not take more than 3 a week.

## 2022-05-20 ENCOUNTER — Telehealth: Payer: Self-pay | Admitting: Neurology

## 2022-05-20 NOTE — Telephone Encounter (Signed)
Called patient he is going to try medication and see if it helps

## 2022-05-20 NOTE — Telephone Encounter (Signed)
Would still try and see if it helps.

## 2022-05-20 NOTE — Telephone Encounter (Signed)
Pt is just now getting his rizatriptan today. His pharmacy hasn't been able to get it until now. He has had a migraine the last 4 days. He would like to make sure it will still work if he takes it today?

## 2022-05-26 ENCOUNTER — Telehealth: Payer: Self-pay

## 2022-05-26 NOTE — Telephone Encounter (Signed)
Pt c/o: seizure Missed medications?  No. Sleep deprived?  No. Alcohol intake?  No. Increased stress? No. Any change in medication color or shape? Yes.   Back to their usual baseline self?  Yes.  . If no, advise go to ER Current medications prescribed by Dr. Delice Lesch: Epidiolex  2x a day Lamictal 2x a day  Keppra 3X 2x a day  Patient had two seizures the day after Thanksgiving and yesterday. Patient feels he is back to baseline. Patient said he had a headache for a week before the seizure. Patient said his legs are hurting and he feels he can only work slow

## 2022-05-27 ENCOUNTER — Other Ambulatory Visit: Payer: Self-pay | Admitting: Neurology

## 2022-05-27 MED ORDER — EPIDIOLEX 100 MG/ML PO SOLN: ORAL | 5 refills | Status: DC

## 2022-05-27 NOTE — Telephone Encounter (Signed)
Pls let him know that I would like to increase the Epidiolex dose, we started on low dose. Please have him increase to 5.48m twice a day. I will send in the updated prescription. Thanks

## 2022-05-27 NOTE — Telephone Encounter (Signed)
Called pateint and sent to his My Chart I told patient to call us if he has anymore seizures

## 2022-06-16 ENCOUNTER — Telehealth: Payer: Self-pay | Admitting: Neurology

## 2022-06-16 NOTE — Telephone Encounter (Signed)
Patient wants a referral to the nutrition office up stairs  he wants to do the keto diet please call patient

## 2022-06-17 NOTE — Telephone Encounter (Signed)
Nutrition and diabetes will not be able to see the pt to do a diet plan they said for the pt to call Dr Leafy Ro office to see if they can help him at [phone]. Pt called to give that information no answer and his voice mail was full.

## 2022-06-17 NOTE — Telephone Encounter (Signed)
Pt called an given the number to Dr Leafy Ro office [phone] so he can call them to see if they can see him to help start a keto diet plan. Pt will call our office back if he needs [place] to send in a referral,

## 2022-07-15 ENCOUNTER — Telehealth: Payer: Self-pay

## 2022-07-15 NOTE — Telephone Encounter (Signed)
Don Reed is calling in requesting to speak with Don Reed in regards to cannabidiol ([place]) 100 MG/ML solution.

## 2022-07-15 NOTE — Telephone Encounter (Signed)
Spoke with Don Reed he asked me to call the pt to have him call epidiolex to have him refill his medication Kin called he was advised he needed to call an have his medication refilled he said he didn't have any missed calls but he will call them,

## 2022-08-03 ENCOUNTER — Ambulatory Visit (INDEPENDENT_AMBULATORY_CARE_PROVIDER_SITE_OTHER): Payer: Medicaid Other | Admitting: Neurology

## 2022-08-03 ENCOUNTER — Encounter: Payer: Self-pay | Admitting: Neurology

## 2022-08-03 ENCOUNTER — Other Ambulatory Visit (INDEPENDENT_AMBULATORY_CARE_PROVIDER_SITE_OTHER): Payer: Medicaid Other

## 2022-08-03 DIAGNOSIS — G43001 Migraine without aura, not intractable, with status migrainosus: Secondary | ICD-10-CM | POA: Diagnosis not present

## 2022-08-03 DIAGNOSIS — G40201 Localization-related (focal) (partial) symptomatic epilepsy and epileptic syndromes with complex partial seizures, not intractable, with status epilepticus: Secondary | ICD-10-CM

## 2022-08-03 MED ORDER — ZONISAMIDE 100 MG PO CAPS: ORAL_CAPSULE | ORAL | 3 refills | Status: DC

## 2022-08-03 MED ORDER — RIZATRIPTAN BENZOATE 10 MG PO TABS: ORAL_TABLET | ORAL | 11 refills | Status: DC

## 2022-08-03 MED ORDER — EMGALITY 120 MG/ML ~~LOC~~ SOAJ: 1.0000 "pen " | SUBCUTANEOUS | 11 refills | Status: DC

## 2022-08-03 MED ORDER — LAMOTRIGINE 100 MG PO TABS: 100.0000 mg | ORAL_TABLET | Freq: Two times a day (BID) | ORAL | 3 refills | Status: DC

## 2022-08-03 MED ORDER — NORTRIPTYLINE HCL 50 MG PO CAPS: ORAL_CAPSULE | ORAL | 2 refills | Status: DC

## 2022-08-03 MED ORDER — LEVETIRACETAM 500 MG PO TABS: ORAL_TABLET | ORAL | 11 refills | Status: DC

## 2022-08-03 NOTE — Patient Instructions (Signed)
Good to see you.  Continue all your seizure medications  2. Bloodwork for LFTs (liver function test)  3. A prescription for the Emgality (monthly injection) to help cut down on headaches has been sent to your pharmacy  4. Discuss the possibility of fibromyalgia with your PCP  5. Keep a calendar of your headaches as you start the Emgality. Follow-up in 4 months, call for any changes   Seizure Precautions: 1. If medication has been prescribed for you to prevent seizures, take it exactly as directed.  Do not stop taking the medicine without talking to your doctor first, even if you have not had a seizure in a long time.   2. Avoid activities in which a seizure would cause danger to yourself or to others.  Don't operate dangerous machinery, swim alone, or climb in high or dangerous places, such as on ladders, roofs, or girders.  Do not drive unless your doctor says you may.  3. If you have any warning that you may have a seizure, lay down in a safe place where you can't hurt yourself.    4.  No driving for 6 months from last seizure, as per [facility].   Please refer to the following link on the [place] website for more information: http://[email or web address].org/answerplace/Social/driving/drivingu.cfm   5.  Maintain good sleep hygiene.  6.  Contact your doctor if you have any problems that may be related to the medicine you are taking.  7.  Call 911 and bring the patient back to the ED if:        A.  The seizure lasts longer than 5 minutes.       B.  The patient doesn't awaken shortly after the seizure  C.  The patient has new problems such as difficulty seeing, speaking or moving  D.  The patient was injured during the seizure  E.  The patient has a temperature over 102 F (39C)  F.  The patient vomited and now is having trouble breathing

## 2022-08-03 NOTE — Progress Notes (Signed)
NEUROLOGY FOLLOW UP OFFICE NOTE  Don Reed [id number] 09/16/1985  HISTORY OF PRESENT ILLNESS: I had the pleasure of seeing Don Reed in follow-up in the neurology clinic on 08/03/2022.  The patient was last seen 7 months ago for seizures and headaches. He is alone in the office today. Records and images were personally reviewed where available.  Since his last visit, Epidiolex was added to his regimen. He is now on 4 ASMs, Epidiolex 5.67m BID, Levetiracetam '1500mg'$  BID, Lamotrigine '100mg'$  BID, and Zonisamide '600mg'$  qhs. He denies any side effects on medications. His last seizure was the day after Thanksgiving, he had a GTC and fell and hit his head. He had urinary incontinence and his fHarrell Larksaid he made a sound she had not heard before. Epidiolex dose increased to 5.472mBID that time. No further seizures since then. He continues to have headaches but had not filled the Emgality injections. He has not been able to get the prn Maxalt either. He is on nortriptyline '100mg'$  qhs. He has been seen by Ortho for his back pain and received an injection last week, which has helped. He reports bilateral leg and feet pain, he cannot stand for a long time and has to lie down. He reports pain and numbness in his legs. He was also reporting pain in his arms, EMG/NCV of both upper extremities did not show any radiculopathy, there was mild right ulnar neuropathy. He has been doing home PT leg exercises. He lives with his fiancee. He does not drive.    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 He states that at age 89,15he was having episodes of slurred speech, falls, recurrent syncope, and headaches, and was found to have the brain tumor. He recalls when he was younger, his head kept turning to one side and he would try to stop it. After the surgery, he did fairly well except for frequent headaches, then  had his first seizure at age [id number] describes seizures as starting with back pain, then he gets a metallic taste in his mouth, his head starts hurting more, then he gets disoriented and has been told he would become unresponsive then proceed to generalized shaking, at times with urinary incontinence. He lives with his fiancee and brother, and denies any isolated staring spells. He has been told he has seizures in his sleep. After a seizure, he denies any focal weakness, he can hear people around him but has difficulty getting his words out. He also has some nausea. He has fallen twice this year with his seizures, he was in the ER on 09/05/15 and 12/02/15 for seizure, during both ER visits, he would have a witnessed seizure in the ER. With the most recent seizure on 12/02/15, he had ran out of Vimpat. He has been taking Vimpat '100mg'$  BID since 2015, on Keppra '1500mg'$  BID since 2011. He recalls taking Depakote and Topamax in the past, with cognitive side effects. He continues to report memory issues, "I can't think too good." He feels his left side is different, with numbness in his left arm and leg. He had cervical fusion surgery and has been told this is the cause of his left-sided symptoms. His last shunt revision was in 1999. 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    He has a history of chronic headaches, with  throbbing headaches occurring around twice a week over the frontal and temporal regions, with associated nausea, vomiting, photo/phonophobia. They would last for 1 to 1-1/2 days, he does not take any medication because he feels they have provoked seizures in the past. He reports taking an unrecalled headache preventative medication in [place] which was helpful. He denies any dizziness, bowel/bladder dysfunction, no myoclonic jerks. He has occasional twitching in an arm or leg. He has chronic neck and back pain.    Epilepsy Risk Factors:  His father had generalized convulsions, his mother had  seizures as well. He has a history of benign brain tumor s/p VP shunt, last revision in 1999. He reports several head injuries with loss of consciousness, most recently when hit in the face by a ball a few years ago. Otherwise he had a normal birth and early development.  There is no history of febrile convulsions, CNS infections such as meningitis/encephalitis.   Prior AEDs: Depakote, Topamax, Vimpat  Diagnostic Data: MRI brain with and without contrast 07/27/2016: no acute changes seen. There were stable postsurgical changes in the posterior fossa related to resection of previous mass. 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 No recurrent mass or abnormal enhancement seen. There is a right transfrontal ventricular shunt seen terminating within the right ventricle. Hippocami symmetric.  MRI cervical spine in 07/2017 showed moderate stenosis at C3-4, moderate foraminal encroachment right greater than left at C3-4, no change from prior MRI. There is moderate right foraminal narrowing at C4-5 and mild left foraminal narrowing at C4-5, unchanged. 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    PAST MEDICAL HISTORY: Past Medical History:  Diagnosis Date   Asthma    Cancer ([place])    Chronic back pain    History of brain shunt    History of brain tumor    Hydrocephalus ([place])    Legally blind    Migraine    Seizures ([place])    Vision loss     MEDICATIONS: Current Outpatient Medications on File Prior to Visit  Medication Sig Dispense Refill   cannabidiol (EPIDIOLEX) 100 MG/ML solution Take 5.71m twice a day 324 mL 5   lamoTRIgine (LAMICTAL) 100 MG tablet Take 1 tablet (100 mg total) by mouth 2 (two) times daily. 180 tablet 3   levETIRAcetam (KEPPRA) 500 MG tablet TAKE 3 TABLETS BY MOUTH TWICE DAILY 180  tablet 11   methocarbamol (ROBAXIN) 500 MG tablet Take by mouth 2 (two) times daily.     nortriptyline (PAMELOR) 50 MG capsule TAKE 2 CAPSULES BY MOUTH EVERY NIGHT 180 capsule 2   PROVENTIL HFA 108 (90 BASE) MCG/ACT inhaler Inhale 2 puffs into the lungs every 6 (six) hours as needed for shortness of breath.   3   rizatriptan (MAXALT) 10 MG tablet Take 1 tablet at onset of migraine. Do not take more than 3 in a week 10 tablet 11   zonisamide (ZONEGRAN) 100 MG capsule Take 6 capsules at night 540 capsule 3   Galcanezumab-gnlm (EMGALITY) 120 MG/ML SOAJ Inject 1 pen  into the skin every 30 (thirty) days. (Patient not taking: Reported on 08/03/2022) 1.12 mL 11   [DISCONTINUED] diphenhydramine-acetaminophen (TYLENOL PM) 25-500 MG TABS tablet Take 2 tablets by mouth at bedtime as needed (for sleep).      No current facility-administered medications on file  prior to visit.    ALLERGIES: Allergies  Allergen Reactions   Amoxicillin Itching and Rash    Has patient had a PCN reaction causing immediate rash, facial/tongue/throat swelling, SOB or lightheadedness with hypotension: Yes Has patient had a PCN reaction causing severe rash involving mucus membranes or skin necrosis: No Has patient had a PCN reaction that required hospitalization No Has patient had a PCN reaction occurring within the last 10 years: Yes If all of the above answers are "NO", then may proceed with Cephalosporin use.    Shellfish Allergy Anaphylaxis, Hives, Shortness Of Breath and Rash    Cannot breathe    Vancomycin Itching and Rash    FAMILY HISTORY: Family History  Problem Relation Age of Onset   Hypertension Mother    Seizures Mother    Cataracts Mother    Stroke Mother    Stroke Father    Seizures Father    Scoliosis Sister    Vision loss Sister    Vision loss Brother    Diabetes Brother    Leukemia Brother     SOCIAL HISTORY: Social History   Socioeconomic History   Marital status: Single    Spouse name:  Not on file   Number of children: 2   Years of education: GED   Highest education level: Not on file  Occupational History    Employer: [employer]  Tobacco Use   Smoking status: Never   Smokeless tobacco: Never  Vaping Use   Vaping Use: Never used  Substance and Sexual Activity   Alcohol use: Yes    Comment: wine   Drug use: No   Sexual activity: Not on file  Other Topics Concern   Not on file  Social History Narrative   Patient lives at home with family.   Caffeine Use: 1-3 time weekly   Right handed   Social Determinants of Health   Financial Resource Strain: Not on file  Food Insecurity: Not on file  Transportation Needs: Not on file  Physical Activity: Not on file  Stress: Not on file  Social Connections: Not on file  Intimate Partner Violence: Not on file     PHYSICAL EXAM: Vitals:   08/03/22 1442  BP: 122/74  Pulse: 96  SpO2: 99%   General: No acute distress, flat affect Head:  Normocephalic/atraumatic Skin/Extremities: No rash, no edema Neurological Exam: alert and awake. No aphasia or dysarthria. Fund of knowledge is appropriate. Attention and concentration are normal.   Cranial nerves: Pupils equal, round. Extraocular movements intact with right-beating nystagmus on primary gaze, less (almost none) nystagmus on left gaze, more prominent right-beating nystagmus on right gaze. Visual fields full.  No facial asymmetry.  Motor: Bulk and tone normal, muscle strength 5/5 throughout with no pronator drift. Reflexes +1 throughout, patient reports pain where reflex hammer hit on arm.  Finger to nose testing intact.  Gait slow and cautious, no ataxia.    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 EEG normal, MRI brain in 02/2021 showed no acute changes with postsurgical  posterior fossa changes seen, right transfrontal shunt in place. He is now on 4 ASMs, last seizure 04/2022. Continue Epidiolex 5.80m BID, Lamotrigine '100mg'$  BID, Levetiracetam '1500mg'$  BID, and Zonisamide '600mg'$  qhs. If he has good response to Epidiolex, we will plan  to start weaning off one of his ASMs. He would like to restart Emgality for migraine prophylaxis, refills sent. Continue nortriptyline '100mg'$  qhs, prn Maxalt for migraine rescue. We discussed need for monitoring LFTs on Epidiolex. He now sees Ortho for back pain, however reports pain in upper and lower extremities, EMG/NCV did not show any clear neurological cause, I wonder about fibromyalgia, he was advised to speak to his PCP. He does not drive. Follow-up in 4 months, call for any changes.    Thank you for allowing me to participate in his care.  Please do not hesitate to call for any questions or concerns.    Ellouise Newer, M.D.   CC: [facility]

## 2022-08-04 LAB — HEPATIC FUNCTION PANEL
ALT: 17 IU/L (ref 0–44)
AST: 17 IU/L (ref 0–40)
Albumin: 4.7 g/dL (ref 4.1–5.1)
Alkaline Phosphatase: 64 IU/L (ref 44–121)
Bilirubin Total: <0.2 mg/dL (ref 0.0–1.2)
Bilirubin, Direct: <0.1 mg/dL (ref 0.00–0.40)
Total Protein: 7.8 g/dL (ref 6.0–8.5)

## 2022-08-05 ENCOUNTER — Telehealth: Payer: Self-pay

## 2022-08-05 NOTE — Telephone Encounter (Signed)
-----   Message from Cameron Sprang, MD sent at 08/04/2022  8:42 AM EST ----- Pls let him know liver function is good, thanks

## 2022-08-05 NOTE — Telephone Encounter (Signed)
Pt called no answer per DPR left a voice mail that liver function is good any questions he can call the office back

## 2022-09-09 ENCOUNTER — Other Ambulatory Visit (HOSPITAL_COMMUNITY): Payer: Self-pay

## 2022-09-23 ENCOUNTER — Other Ambulatory Visit (HOSPITAL_COMMUNITY): Payer: Self-pay

## 2022-09-23 ENCOUNTER — Telehealth: Payer: Self-pay

## 2022-09-23 NOTE — Telephone Encounter (Signed)
Patient Advocate Encounter   Received notification from [facility] that prior authorization is required for Emgality 120MG /ML auto-injectors (migraine)   Submitted: 09-23-2022 Key [id number]  Status is pending

## 2022-10-07 ENCOUNTER — Other Ambulatory Visit (HOSPITAL_COMMUNITY): Payer: Self-pay

## 2022-10-07 NOTE — Telephone Encounter (Signed)
Patient Advocate Encounter  Prior Authorization for  Emgality 120MG /ML auto-injectors (migraine) has been approved.      Effective: 10-06-2022 to 12-25-2022

## 2022-10-07 NOTE — Telephone Encounter (Signed)
Pt called no answer left a voice mail per DPR that emgality was approved

## 2022-11-28 ENCOUNTER — Telehealth: Payer: Self-pay | Admitting: Pharmacy Technician

## 2022-11-28 NOTE — Telephone Encounter (Signed)
Patient Advocate Encounter  Received notification from [facility] that prior authorization for [facility] 120MG  is required.   PA submitted on 6.1.24 Key [id number] Status is pending

## 2022-12-16 ENCOUNTER — Ambulatory Visit: Payer: Self-pay | Admitting: Neurology

## 2022-12-16 ENCOUNTER — Encounter: Payer: Self-pay | Admitting: Neurology

## 2023-01-09 ENCOUNTER — Ambulatory Visit (HOSPITAL_COMMUNITY)
Admission: EM | Admit: 2023-01-09 | Discharge: 2023-01-09 | Disposition: A | Discharge status: Home / Self Care | Payer: Medicaid Other

## 2023-01-09 ENCOUNTER — Encounter (HOSPITAL_COMMUNITY): Payer: Self-pay

## 2023-01-09 DIAGNOSIS — T63481A Toxic effect of venom of other arthropod, accidental (unintentional), initial encounter: Secondary | ICD-10-CM

## 2023-01-09 DIAGNOSIS — R220 Localized swelling, mass and lump, head: Secondary | ICD-10-CM | POA: Diagnosis not present

## 2023-01-09 MED ORDER — METHYLPREDNISOLONE ACETATE 80 MG/ML IJ SUSP
INTRAMUSCULAR | Status: AC
  Filled 2023-01-09: qty 1

## 2023-01-09 MED ORDER — METHYLPREDNISOLONE ACETATE 80 MG/ML IJ SUSP
80.0000 mg | Freq: Once | INTRAMUSCULAR | Status: AC
  Administered 2023-01-09: 80 mg via INTRAMUSCULAR

## 2023-01-09 NOTE — Discharge Instructions (Signed)
You were seen today for an insect sting.  You received a steroid injection to help reduce the swelling.  You may take up to 50 mg of Benadryl every 8 hours as needed.  Please follow-up if your symptoms persist or worsen.

## 2023-01-09 NOTE — ED Triage Notes (Signed)
Here for insect bite to his lip. Pt took benadryl.

## 2023-01-09 NOTE — ED Provider Notes (Signed)
[facility]    CSN: [id number] Arrival date & time: 01/09/23  1213      History   Chief Complaint Chief Complaint  Patient presents with   Insect Bite    HPI Don Reed is a 37 y.o. male presents to urgent care today with complaint of 2-day history of upper lip swelling.  He reports he got stung by something but was not sure if it was a bee, hornet or wasp.  He denies any swelling of the tongue or throat.  He has not noticed any other facial edema.  He has tried Benadryl OTC with minimal relief of symptoms.  HPI  Past Medical History:  Diagnosis Date   Asthma    Cancer ([facility])    Chronic back pain    History of brain shunt    History of brain tumor    Hydrocephalus ([facility])    Legally blind    Migraine    Seizures ([facility])    Vision loss     Patient Active Problem List   Diagnosis Date Noted   Unintentional weight loss 11/02/2017   Localization-related (focal) (partial) symptomatic epilepsy and epileptic syndromes with complex partial seizures, not intractable, without status epilepticus ([facility]) 02/14/2016   Migraine without aura and without status migrainosus, not intractable 02/14/2016   Generalized seizure disorder ([facility]) 04/06/2014   Brain tumor (benign) ([facility]) 04/06/2014   S/P ventricular shunt placement 04/06/2014   S/P cervical spinal fusion 04/06/2014   Neck pain 04/06/2014    Past Surgical History:  Procedure Laterality Date   BRAIN SURGERY  age 37   tumor   SPINAL FUSION  2011   VENTRICULO-PERITONEAL SHUNT PLACEMENT / LAPAROSCOPIC INSERTION PERITONEAL CATHETER     WISDOM TOOTH EXTRACTION Bilateral        Home Medications    Prior to Admission medications   Medication Sig Start Date End Date Taking? Authorizing Provider  cannabidiol (EPIDIOLEX) 100 MG/ML solution Take 5.28mL twice a day 05/27/22  Yes Van Clines, MD  Galcanezumab-gnlm [facility]) 120 MG/ML SOAJ Inject 1 pen  into the skin every 30 (thirty) days. 08/03/22  Yes Van Clines, MD  lamoTRIgine (LAMICTAL) 100 MG tablet Take 1 tablet (100 mg total) by mouth 2 (two) times daily. 08/03/22  Yes Van Clines, MD  levETIRAcetam (KEPPRA) 500 MG tablet TAKE 3 TABLETS BY MOUTH TWICE DAILY 08/03/22  Yes Van Clines, MD  methocarbamol (ROBAXIN) 500 MG tablet Take by mouth 2 (two) times daily. 01/02/21  Yes [provider]  nortriptyline (PAMELOR) 50 MG capsule TAKE 2 CAPSULES BY MOUTH EVERY NIGHT 08/03/22  Yes Van Clines, MD  pregabalin (LYRICA) 50 MG capsule Take 50 mg by mouth 2 (two) times daily. 12/02/22  Yes [provider]  PROVENTIL HFA 108 (90 BASE) MCG/ACT inhaler Inhale 2 puffs into the lungs every 6 (six) hours as needed for shortness of breath.  11/13/14  Yes [provider]  rizatriptan (MAXALT) 10 MG tablet Take 1 tablet at onset of migraine. Do not take more than 3 in a week 08/03/22  Yes Van Clines, MD  zonisamide [facility]) 100 MG capsule Take 6 capsules at night 08/03/22  Yes Van Clines, MD  diphenhydramine-acetaminophen (TYLENOL PM) 25-500 MG TABS tablet Take 2 tablets by mouth at bedtime as needed (for sleep).   12/04/19  [provider]    Family History Family History  Problem Relation Age of Onset   Hypertension Mother    Seizures  Mother    Cataracts Mother    Stroke Mother    Stroke Father    Seizures Father    Scoliosis Sister    Vision loss Sister    Vision loss Brother    Diabetes Brother    Leukemia Brother     Social History Social History   Tobacco Use   Smoking status: Never   Smokeless tobacco: Never  Vaping Use   Vaping status: Never Used  Substance Use Topics   Alcohol use: Yes    Comment: wine   Drug use: No     Allergies   Amoxicillin, Shellfish allergy, and Vancomycin   Review of Systems Review of Systems  Constitutional:  Negative for chills and fever.  HENT:  Negative for sore throat and trouble swallowing.        Positive for lip swelling  Respiratory:  Negative for  chest tightness, shortness of breath and stridor.   Cardiovascular:  Negative for chest pain.  Skin:  Negative for rash.  Neurological:  Negative for weakness and headaches.     Physical Exam Triage Vital Signs ED Triage Vitals [01/09/23 1242]  Encounter Vitals Group     BP 108/77     Systolic BP Percentile      Diastolic BP Percentile      Pulse Rate 82     Resp 16     Temp 98.4 F (36.9 C)     Temp Source Oral     SpO2 95 %     Weight      Height      Head Circumference      Peak Flow      Pain Score      Pain Loc      Pain Education      Exclude from Growth Chart    No data found.  Updated Vital Signs BP 108/77 (BP Location: Left Arm)   Pulse 82   Temp 98.4 F (36.9 C) (Oral)   Resp 16   SpO2 95%       Physical Exam Constitutional:      General: He is not in acute distress. HENT:     Head: Normocephalic.     Mouth/Throat:     Mouth: Mucous membranes are moist.     Pharynx: Oropharynx is clear. No posterior oropharyngeal erythema.     Comments: 1+ swelling noted of the upper lip Cardiovascular:     Rate and Rhythm: Normal rate and regular rhythm.     Heart sounds: Normal heart sounds.  Pulmonary:     Effort: Pulmonary effort is normal. No respiratory distress.     Breath sounds: Normal breath sounds. No stridor. No wheezing.  Skin:    Findings: No rash.  Neurological:     Mental Status: He is alert and oriented to person, place, and time.      UC Treatments / Results  Labs  EKG   Radiology No results found.    Medications Ordered in UC Medications  methylPREDNISolone acetate (DEPO-MEDROL) injection 80 mg (has no administration in time range)    Initial Impression / Assessment and Plan / UC Course  I have reviewed the triage vital signs and the nursing notes.  Pertinent labs & imaging results that were available during my care of the patient were reviewed by me and considered in my medical decision making (see chart for details).      37 year old male with 2-day history of upper lip swelling secondary to  insect sting.  No signs of respiratory distress.  80 mg of Depo-Medrol IM x 1.  Advised him to continue up to 50 mg of Benadryl every 8 hours.  Monitor for increased swelling, discomfort or difficulty breathing.  Follow-up if symptoms persist. Final Clinical Impressions(s) / UC Diagnoses   Final diagnoses:  Insect stings, accidental or unintentional, initial encounter  Swelling of upper lip     Discharge Instructions      You were seen today for an insect sting.  You received a steroid injection to help reduce the swelling.  You may take up to 50 mg of Benadryl every 8 hours as needed.  Please follow-up if your symptoms persist or worsen.     ED Prescriptions   None    PDMP not reviewed this encounter.   Lorre Munroe, NP 01/09/23 1256

## 2023-01-29 ENCOUNTER — Telehealth: Payer: Self-pay | Admitting: Neurology

## 2023-01-29 MED ORDER — PREDNISONE 10 MG (21) PO TBPK: ORAL_TABLET | ORAL | 0 refills | Status: DC

## 2023-01-29 NOTE — Telephone Encounter (Signed)
Pt called informed we can try a steroid dose pack to break up his headache he said yes he would like to try it,  script sent to [facility] on [address]

## 2023-01-29 NOTE — Telephone Encounter (Signed)
How frequent or the headaches (on average, how many days a week/month are they occurring)? unsure How long do the headaches last?  This one has been 4 days  Verify what preventative medication and dose you are taking (e.g. topiramate, propranolol, amitriptyline, Emgality, etc)  emgality ( hasn't had the emgality in a while said they pharmacy called today and said it was ready)     He is taken the  nortriptyline Verify which rescue medication you are taking (triptan, Advil, Excedrin, Aleve, Ubrelvy, etc)  maxalt How often are you taking pain relievers/analgesics/rescue mediction?  Taken it 3 times    Pt stated that the heat and going out side is making it worse,

## 2023-01-29 NOTE — Telephone Encounter (Signed)
Pt is calling in stating that he has had to use his recovery medication 2-3 times this week for a migraine and he is not sure what he needs to do.  He stated that he has had the migraine since the beginning of the week.  Pt would like to have a call back

## 2023-03-22 ENCOUNTER — Other Ambulatory Visit (INDEPENDENT_AMBULATORY_CARE_PROVIDER_SITE_OTHER): Payer: Medicaid Other

## 2023-03-22 ENCOUNTER — Encounter: Payer: Self-pay | Admitting: Neurology

## 2023-03-22 ENCOUNTER — Ambulatory Visit: Payer: Medicaid Other | Admitting: Neurology

## 2023-03-22 VITALS — BP 110/77 | HR 85 | Ht 73.0 in | Wt 240.0 lb

## 2023-03-22 DIAGNOSIS — G40201 Localization-related (focal) (partial) symptomatic epilepsy and epileptic syndromes with complex partial seizures, not intractable, with status epilepticus: Secondary | ICD-10-CM | POA: Diagnosis not present

## 2023-03-22 DIAGNOSIS — G43001 Migraine without aura, not intractable, with status migrainosus: Secondary | ICD-10-CM

## 2023-03-22 DIAGNOSIS — R519 Headache, unspecified: Secondary | ICD-10-CM

## 2023-03-22 MED ORDER — EPIDIOLEX 100 MG/ML PO SOLN: ORAL | 5 refills | Status: DC

## 2023-03-22 MED ORDER — ZONISAMIDE 100 MG PO CAPS
ORAL_CAPSULE | ORAL | 3 refills | Status: DC
Start: 2023-03-22 — End: 2023-10-04

## 2023-03-22 MED ORDER — EMGALITY 120 MG/ML ~~LOC~~ SOAJ: 1.0000 "pen " | SUBCUTANEOUS | 11 refills | Status: DC

## 2023-03-22 MED ORDER — LAMOTRIGINE 100 MG PO TABS: 100.0000 mg | ORAL_TABLET | Freq: Two times a day (BID) | ORAL | 3 refills | Status: DC

## 2023-03-22 MED ORDER — LEVETIRACETAM 500 MG PO TABS
ORAL_TABLET | ORAL | 11 refills | Status: DC
Start: 2023-03-22 — End: 2023-10-04

## 2023-03-22 MED ORDER — NORTRIPTYLINE HCL 50 MG PO CAPS
ORAL_CAPSULE | ORAL | 2 refills | Status: DC
Start: 2023-03-22 — End: 2023-10-04

## 2023-03-22 MED ORDER — RIZATRIPTAN BENZOATE 10 MG PO TABS: ORAL_TABLET | ORAL | 11 refills | Status: DC

## 2023-03-22 NOTE — Progress Notes (Signed)
NEUROLOGY FOLLOW UP OFFICE NOTE  Don Reed [id number] 1986-05-26  HISTORY OF PRESENT ILLNESS: I had the pleasure of seeing Don Reed in follow-up in the neurology clinic on 03/22/2023.  The patient was last seen 7 months ago for seizures and headaches. He is alone in the office today.  Records and images were personally reviewed where available.  Since his last visit, he denies any seizures since 04/2022. He is on 4 ASMs, including Epidiolex 5.92mL Bid, Levetiracetam 1500mg  BID, Lamotrigine 100mg  BID, and Zonisamide 600mg  at bedtime. No side effects on medications. The migraines are not as bad, he is on Emgality and nortriptyline 100mg  at bedtime, with prn Maxalt for rescue. He called our office last month for a prolonged migraine and was given a course of steroid with helped. Recently he has been having a different type of headache with sharp pains localized over the right parietal region.They can last for 3 days, no associated nausea/vomiting. He has also been diagnosed by his PCP with fibromyalgia, started on Lyrica 50mg  BID, cyclobenzaprine, and prn Celebrex. He felt it did help, however he ran out for a few days. He is back on medication but last night slept with his arms tucked down, he woke up with elbows hurting so bad, sensitive to touch. Sleep is okay, mood is alright. He denies any falls. He lives with his wife [place]. He does not drive.    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 He states that at age 79, he was having episodes of slurred speech, falls, recurrent syncope, and headaches, and was found to have the brain tumor. He recalls when he was younger, his head kept turning to one side and he would try to stop it. After the surgery, he did  fairly well except for frequent headaches, then had his first seizure at age 38 or 19. He describes seizures as starting with back pain, then he gets a metallic taste in his mouth, his head starts hurting more, then he gets disoriented and has been told he would become unresponsive then proceed to generalized shaking, at times with urinary incontinence. He lives with his fiancee and brother, and denies any isolated staring spells. He has been told he has seizures in his sleep. After a seizure, he denies any focal weakness, he can hear people around him but has difficulty getting his words out. He also has some nausea. He has fallen twice this year with his seizures, he was in the ER on 09/05/15 and 12/02/15 for seizure, during both ER visits, he would have a witnessed seizure in the ER. With the most recent seizure on 12/02/15, he had ran out of Vimpat. He has been taking Vimpat 100mg  BID since 2015, on Keppra 1500mg  BID since 2011. He recalls taking Depakote and Topamax in the past, with cognitive side effects. He continues to report memory issues, "I can't think too good." He feels his left side is different, with numbness in his left arm and leg. He had cervical fusion surgery and has been told this is the cause of his left-sided symptoms. His last shunt revision was in 1999. 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  He has a history of chronic headaches, with throbbing headaches occurring around twice a week over the frontal and temporal regions, with associated nausea, vomiting, photo/phonophobia. They would last for 1 to 1-1/2 days, he does not take any medication because he feels they have provoked seizures in the past. He reports taking an unrecalled headache preventative medication in [place] which was helpful. He denies any dizziness, bowel/bladder dysfunction, no myoclonic jerks. He has occasional twitching in an arm or leg. He has chronic neck and back pain.    Epilepsy Risk Factors:  His  father had generalized convulsions, his mother had seizures as well. He has a history of benign brain tumor s/p VP shunt, last revision in 1999. He reports several head injuries with loss of consciousness, most recently when hit in the face by a ball a few years ago. Otherwise he had a normal birth and early development.  There is no history of febrile convulsions, CNS infections such as meningitis/encephalitis.   Prior AEDs: Depakote, Topamax, Vimpat  Diagnostic Data: MRI brain with and without contrast 07/27/2016: no acute changes seen. There were stable postsurgical changes in the posterior fossa related to resection of previous mass. 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 No recurrent mass or abnormal enhancement seen. There is a right transfrontal ventricular shunt seen terminating within the right ventricle. Hippocami symmetric.  MRI cervical spine in 07/2017 showed moderate stenosis at C3-4, moderate foraminal encroachment right greater than left at C3-4, no change from prior MRI. There is moderate right foraminal narrowing at C4-5 and mild left foraminal narrowing at C4-5, unchanged. 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   PAST MEDICAL HISTORY: Past Medical History:  Diagnosis Date   Asthma    Cancer (HCC)    Chronic back pain    History of brain shunt    History of brain tumor    Hydrocephalus (HCC)    Legally blind    Migraine    Seizures (HCC)    Vision loss     MEDICATIONS: Current Outpatient Medications on File Prior to Visit  Medication Sig Dispense Refill   cannabidiol (EPIDIOLEX) 100 MG/ML solution Take 5.50mL twice a day 324 mL 5   Galcanezumab-gnlm (EMGALITY) 120 MG/ML SOAJ Inject 1 pen  into the skin every 30 (thirty) days. 1.12 mL 11   lamoTRIgine (LAMICTAL) 100 MG  tablet Take 1 tablet (100 mg total) by mouth 2 (two) times daily. 180 tablet 3   levETIRAcetam (KEPPRA) 500 MG tablet TAKE 3 TABLETS BY MOUTH TWICE DAILY 180 tablet 11   methocarbamol (ROBAXIN) 500 MG tablet Take by mouth 2 (two) times daily.     nortriptyline (PAMELOR) 50 MG capsule TAKE 2 CAPSULES BY MOUTH EVERY NIGHT 180 capsule 2   predniSONE (STERAPRED UNI-PAK 21 TAB) 10 MG (21) TBPK tablet take 60mg  day 1, then 50mg  day 2, then 40mg  day 3, then 30mg  day 4, then 20mg  day 5, then 10mg  day 6, then STOP 21 tablet 0   pregabalin (LYRICA) 50 MG capsule Take 50 mg by mouth 2 (two) times daily.     PROVENTIL HFA 108 (90 BASE) MCG/ACT inhaler Inhale 2 puffs into the lungs every 6 (six) hours as needed for shortness of breath.   3   rizatriptan (MAXALT) 10 MG tablet Take 1 tablet at onset of  migraine. Do not take more than 3 in a week 10 tablet 11   zonisamide (ZONEGRAN) 100 MG capsule Take 6 capsules at night 540 capsule 3   [DISCONTINUED] diphenhydramine-acetaminophen (TYLENOL PM) 25-500 MG TABS tablet Take 2 tablets by mouth at bedtime as needed (for sleep).      No current facility-administered medications on file prior to visit.    ALLERGIES: Allergies  Allergen Reactions   Amoxicillin Itching and Rash    Has patient had a PCN reaction causing immediate rash, facial/tongue/throat swelling, SOB or lightheadedness with hypotension: Yes Has patient had a PCN reaction causing severe rash involving mucus membranes or skin necrosis: No Has patient had a PCN reaction that required hospitalization No Has patient had a PCN reaction occurring within the last 10 years: Yes If all of the above answers are "NO", then may proceed with Cephalosporin use.    Shellfish Allergy Anaphylaxis, Hives, Shortness Of Breath and Rash    Cannot breathe    Vancomycin Itching and Rash    FAMILY HISTORY: Family History  Problem Relation Age of Onset   Hypertension Mother    Seizures Mother    Cataracts Mother     Stroke Mother    Stroke Father    Seizures Father    Scoliosis Sister    Vision loss Sister    Vision loss Brother    Diabetes Brother    Leukemia Brother     SOCIAL HISTORY: Social History   Socioeconomic History   Marital status: Single    Spouse name: Not on file   Number of children: 2   Years of education: GED   Highest education level: Not on file  Occupational History    Employer: [employer]  Tobacco Use   Smoking status: Never   Smokeless tobacco: Never  Vaping Use   Vaping status: Never Used  Substance and Sexual Activity   Alcohol use: Yes    Comment: wine   Drug use: No   Sexual activity: Not on file  Other Topics Concern   Not on file  Social History Narrative   Patient lives at home with family.   Caffeine Use: 1-3 time weekly   Right handed   Social Determinants of Health   Financial Resource Strain: Not on file  Food Insecurity: Not on file  Transportation Needs: Not on file  Physical Activity: Not on file  Stress: Not on file  Social Connections: Unknown (10/31/2021)   [facility]   Social Network    Social Network: Not on file  Intimate Partner Violence: Unknown (10/01/2021)   Received from [facility]   HITS    Physically Hurt: Not on file    Insult or Talk Down To: Not on file    Threaten Physical Harm: Not on file    Scream or Curse: Not on file     PHYSICAL EXAM: Vitals:   03/22/23 1455  BP: 110/77  Pulse: 85  SpO2: 98%   General: No acute distress Head:  Normocephalic/atraumatic Skin/Extremities: No rash, no edema Neurological Exam: alert and awake. No aphasia or dysarthria. Fund of knowledge is appropriate. Attention and concentration are normal.   Cranial nerves: Pupils equal, round. Extraocular movements intact with right-beating nystagmus on primary gaze, more prominent on right gaze (similar to prior). Visual fields full.  No facial asymmetry.  Motor: Bulk and tone  normal, muscle strength 5/5 throughout with no pronator drift.   Finger to nose  testing intact.  Gait narrow-based and steady,difficulty with tandem walk. Romberg negative. 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 EEG normal, MRI brain in 02/2021 showed no acute changes with postsurgical posterior fossa changes seen, right transfrontal shunt in place. He has been doing well seizure-free since 04/2022 on 4 ASMs, continue Epidiolex 5.50mL BID, Lamotrigine 100mg  BID, Levetiracetam 1500mg  BID, and Zonisamide 600mg  qhs. Migraines overall better with Emgality, nortriptyline 100mg  at bedtime, and prn Maxalt. He is reporting a new type of localized right-sided sharp head pains, MRI brain with and without contrast will be ordered to assess for underlying structural abnormality. He is reporting more body pains and will discuss increasing Lyrica with PCP. He does not drive. Safety labs with CBC, CMP will be ordered today. Follow-up in 6 months, call for any changes.      Thank you for allowing me to participate in his care.  Please do not hesitate to call for any questions or concerns.   Patrcia Dolly, M.D.   CC: [facility]

## 2023-03-22 NOTE — Patient Instructions (Signed)
Good to see you overall doing better.  Have bloodwork done for CBC, CMP  2. Schedule open MRI brain with and without contrast  3. Continue all your medications  4. Can discuss increasing Lyrica with your PCP  5. Follow-up in 6 months, call for any changes   Seizure Precautions: 1. If medication has been prescribed for you to prevent seizures, take it exactly as directed.  Do not stop taking the medicine without talking to your doctor first, even if you have not had a seizure in a long time.   2. Avoid activities in which a seizure would cause danger to yourself or to others.  Don't operate dangerous machinery, swim alone, or climb in high or dangerous places, such as on ladders, roofs, or girders.  Do not drive unless your doctor says you may.  3. If you have any warning that you may have a seizure, lay down in a safe place where you can't hurt yourself.    4.  No driving for 6 months from last seizure, as per [employer]   Please refer to the following link on the Epilepsy Foundation of America's website for more information: http://www.epilepsyfoundation.org/answerplace/Social/driving/drivingu.cfm   5.  Maintain good sleep hygiene.  6.  Contact your doctor if you have any problems that may be related to the medicine you are taking.  7.  Call 911 and bring the patient back to the ED if:        A.  The seizure lasts longer than 5 minutes.       B.  The patient doesn't awaken shortly after the seizure  C.  The patient has new problems such as difficulty seeing, speaking or moving  D.  The patient was injured during the seizure  E.  The patient has a temperature over 102 F (39C)  F.  The patient vomited and now is having trouble breathing

## 2023-03-23 LAB — COMPREHENSIVE METABOLIC PANEL
ALT: 19 U/L (ref 0–53)
AST: 15 U/L (ref 0–37)
Albumin: 4.7 g/dL (ref 3.5–5.2)
Alkaline Phosphatase: 54 U/L (ref 39–117)
BUN: 11 mg/dL (ref 6–23)
CO2: 23 mEq/L (ref 19–32)
Calcium: 9.5 mg/dL (ref 8.4–10.5)
Chloride: 108 mEq/L (ref 96–112)
Creatinine, Ser: 1.45 mg/dL (ref 0.40–1.50)
GFR: 61.25 mL/min (ref >60.00)
Glucose, Bld: 96 mg/dL (ref 70–99)
Potassium: 4.2 mEq/L (ref 3.5–5.1)
Sodium: 140 mEq/L (ref 135–145)
Total Bilirubin: 0.4 mg/dL (ref 0.2–1.2)
Total Protein: 7.9 g/dL (ref 6.0–8.3)

## 2023-03-23 LAB — CBC
HCT: 42.7 % (ref 39.0–52.0)
Hemoglobin: 13.7 g/dL (ref 13.0–17.0)
MCHC: 32.2 g/dL (ref 30.0–36.0)
MCV: 81.1 fl (ref 78.0–100.0)
Platelets: 257 10*3/uL (ref 150.0–400.0)
RBC: 5.26 Mil/uL (ref 4.22–5.81)
RDW: 14.4 % (ref 11.5–15.5)
WBC: 6.4 10*3/uL (ref 4.0–10.5)

## 2023-03-24 ENCOUNTER — Telehealth: Payer: Self-pay

## 2023-03-24 NOTE — Telephone Encounter (Signed)
Pt called no answer per DPR left a voice mail bloodwork looks good, normal liver and kidney function

## 2023-03-24 NOTE — Telephone Encounter (Signed)
-----   Message from Van Clines sent at 03/23/2023  3:19 PM EDT ----- Pls let him know bloodwork looks good, normal liver and kidney function, thanks

## 2023-03-25 ENCOUNTER — Telehealth: Payer: Self-pay

## 2023-03-25 NOTE — Telephone Encounter (Signed)
Pt needs a PA for his epidiolex

## 2023-03-26 ENCOUNTER — Other Ambulatory Visit (HOSPITAL_COMMUNITY): Payer: Self-pay

## 2023-03-26 ENCOUNTER — Telehealth: Payer: Self-pay | Admitting: Pharmacy Technician

## 2023-03-26 NOTE — Telephone Encounter (Signed)
Pharmacy Patient Advocate Encounter   Received notification from Physician's Office that prior authorization for EPIDIOLEX 100MG  is required/requested.   Insurance verification completed.   The patient is insured through [facility] MEDICAID .   Per test claim: PA required; PA submitted to [facility] MEDICAID via CoverMyMeds Key/confirmation #/EOC [id number] Status is pending

## 2023-04-02 ENCOUNTER — Telehealth: Payer: Self-pay

## 2023-04-02 NOTE — Telephone Encounter (Signed)
Pt called an informed that brain MRI did not show any new changes, no tumor, stroke, or bleed. It showed old post-surgical changes

## 2023-04-02 NOTE — Telephone Encounter (Signed)
-----   Message from Van Clines sent at 04/02/2023  8:50 AM EDT ----- Regarding: MRI results Pls let him know brain MRI did not show any new changes, no tumor, stroke, or bleed. It showed old post-surgical changes. Thanks ----- Message ----- From: Rosezena Sensor Sent: 04/01/2023   3:03 PM EDT To: Van Clines, MD

## 2023-04-23 ENCOUNTER — Other Ambulatory Visit (HOSPITAL_COMMUNITY): Payer: Self-pay

## 2023-04-23 NOTE — Telephone Encounter (Signed)
Pharmacy Patient Advocate Encounter  Received notification from [facility] that Prior Authorization for Epidiolex (cannabidiol) 100MG /ML solution has been APPROVED from 03-26-2023 to 03-25-2024   PA #/Case ID/Reference #: [id number]

## 2023-04-23 NOTE — Telephone Encounter (Signed)
Following up on PT PA for epidiolex

## 2023-04-23 NOTE — Telephone Encounter (Signed)
Pharmacy Patient Advocate Encounter  Received notification from [facility] that Prior Authorization for Emgality 120MG /ML auto-injectors (migraine) has been APPROVED from 11-28-2022 to 11-28-2023   PA #/Case ID/Reference #: [id number]

## 2023-04-23 NOTE — Telephone Encounter (Signed)
PA request has been Approved.  Patient filled 04-01-2023 and cannot fill again until 04-24-2023

## 2023-05-06 ENCOUNTER — Ambulatory Visit: Payer: Medicaid Other | Admitting: Family Medicine

## 2023-05-14 ENCOUNTER — Encounter: Payer: Self-pay | Admitting: Neurology

## 2023-05-31 ENCOUNTER — Telehealth: Payer: Self-pay | Admitting: Neurology

## 2023-05-31 NOTE — Telephone Encounter (Signed)
Caller stated he had 2 seizures on Friday. First seizure lasted 1 minute and the second seizure lasted 1 minute and a half. Pt needed to let Dr Karel Jarvis know.

## 2023-05-31 NOTE — Telephone Encounter (Signed)
Pt called no answer 

## 2023-06-01 NOTE — Telephone Encounter (Signed)
Pt called no answer left a voice mail to call the office back  °

## 2023-06-02 MED ORDER — LAMOTRIGINE 25 MG PO TABS: 25.0000 mg | ORAL_TABLET | Freq: Two times a day (BID) | ORAL | 1 refills | Status: DC

## 2023-06-02 NOTE — Telephone Encounter (Signed)
He is on 4 AEDs currently.  Has he been on a higher dose of lamictal in the past?  We could increase lamictal to 125mg  twice daily (would need to send Rx for 25mg  tablet) or he can monitor and discuss options with Dr.Aquino when she returns next week.

## 2023-06-02 NOTE — Telephone Encounter (Signed)
Pt called advised that Dr Allena Katz stated that  Has he been on a higher dose of lamictal in the past? We could increase lamictal to 125mg  twice daily or he can monitor and discuss options with Dr.Aquino when she returns next week. Pt would like to try the increased lamictal

## 2023-06-02 NOTE — Telephone Encounter (Signed)
Pt called no answer left a voice mail to call the office back  °

## 2023-06-02 NOTE — Telephone Encounter (Signed)
We can either increase the Lamictal to 125mg  BID (pls send Rx for 25mg  BID to take with 100mg  BID), or we can increase the Epidiolex to 5.36mL BID. He will need to have his LFTs done first. Thanks

## 2023-06-02 NOTE — Telephone Encounter (Signed)
Pt c/o: seizure Missed medications?  No. Sleep deprived?  Yes.   Sometimes hard to go to sleep not getting full 8 hours  Alcohol intake?  No. Increased stress? No. Any trigger? Back started to hurt, had a metal taste in his mouth.  Any change in medication color or shape? Yes lamicatal different shape Back to their usual baseline self?  Yes.  . If no, advise go to ER Current medications prescribed by Dr. Karel Jarvis:  cannabidiol (EPIDIOLEX) 100 MG/ML solution Take 5.9mL twice a day,  lamoTRIgine (LAMICTAL) 100 MG tablet  Take 1 tablet (100 mg total) by mouth 2 (two) times daily.,  levETIRAcetam (KEPPRA) 500 MG tablet TAKE 3 TABLETS BY MOUTH TWICE DAILY  zonisamide (ZONEGRAN) 100 MG capsule Take 6 capsules at night   Pt stated that he has had some dizzy spells after the seizures,  Pt was in the car in the passenger seat when he had the 2 seizures

## 2023-06-03 NOTE — Telephone Encounter (Signed)
Pt increased the Lamictal to 125mg  BID

## 2023-06-04 ENCOUNTER — Telehealth: Payer: Self-pay | Admitting: Neurology

## 2023-06-04 NOTE — Telephone Encounter (Signed)
Pt wants to see if Dr Karel Jarvis will do a letter that states that his Healthcare worker can take him to his appts and to run errands

## 2023-06-04 NOTE — Telephone Encounter (Signed)
Pt called informed that Dr Karel Jarvis is out of the office this week she will be back next week I will call him if she is able to do his letter that is needed.

## 2023-07-28 NOTE — Telephone Encounter (Signed)
I missed this, pls check if this is still needed? Thanks

## 2023-07-28 NOTE — Telephone Encounter (Signed)
Pt stated that he dose still need that letter

## 2023-07-29 ENCOUNTER — Encounter: Payer: Self-pay | Admitting: Neurology

## 2023-07-29 NOTE — Telephone Encounter (Signed)
Pt called no answer voice mail full sent a mychat  message the letter he requested is ready an at the front desk

## 2023-07-29 NOTE — Telephone Encounter (Signed)
Done, thanks

## 2023-10-04 ENCOUNTER — Telehealth (INDEPENDENT_AMBULATORY_CARE_PROVIDER_SITE_OTHER): Payer: Medicaid Other | Admitting: Neurology

## 2023-10-04 ENCOUNTER — Encounter: Payer: Self-pay | Admitting: Neurology

## 2023-10-04 DIAGNOSIS — G43001 Migraine without aura, not intractable, with status migrainosus: Secondary | ICD-10-CM | POA: Diagnosis not present

## 2023-10-04 DIAGNOSIS — G40201 Localization-related (focal) (partial) symptomatic epilepsy and epileptic syndromes with complex partial seizures, not intractable, with status epilepticus: Secondary | ICD-10-CM

## 2023-10-04 MED ORDER — EPIDIOLEX 100 MG/ML PO SOLN: ORAL | 5 refills | Status: DC

## 2023-10-04 MED ORDER — NORTRIPTYLINE HCL 50 MG PO CAPS
ORAL_CAPSULE | ORAL | 2 refills | Status: AC
Start: 2023-10-04 — End: ?

## 2023-10-04 MED ORDER — LAMOTRIGINE 100 MG PO TABS: ORAL_TABLET | ORAL | 3 refills | Status: AC

## 2023-10-04 MED ORDER — ZONISAMIDE 100 MG PO CAPS
ORAL_CAPSULE | ORAL | 3 refills | Status: AC
Start: 2023-10-04 — End: ?

## 2023-10-04 MED ORDER — LEVETIRACETAM 500 MG PO TABS
ORAL_TABLET | ORAL | 11 refills | Status: AC
Start: 2023-10-04 — End: ?

## 2023-10-04 MED ORDER — RIZATRIPTAN BENZOATE 10 MG PO TABS: ORAL_TABLET | ORAL | 11 refills | Status: DC

## 2023-10-04 MED ORDER — LAMOTRIGINE 25 MG PO TABS: ORAL_TABLET | ORAL | 3 refills | Status: AC

## 2023-10-04 NOTE — Patient Instructions (Signed)
 Good to see you! Thank you again for your understanding.  Continue all your medications. Let me know if Emgality is needed if headaches recur  2. Follow-up in 4 months, call for any changes   Seizure Precautions: 1. If medication has been prescribed for you to prevent seizures, take it exactly as directed.  Do not stop taking the medicine without talking to your doctor first, even if you have not had a seizure in a long time.   2. Avoid activities in which a seizure would cause danger to yourself or to others.  Don't operate dangerous machinery, swim alone, or climb in high or dangerous places, such as on ladders, roofs, or girders.  Do not drive unless your doctor says you may.  3. If you have any warning that you may have a seizure, lay down in a safe place where you can't hurt yourself.    4.  No driving for 6 months from last seizure, as per [facility].   Please refer to the following link on the Epilepsy Foundation of America's website for more information: http://www.epilepsyfoundation.org/answerplace/Social/driving/drivingu.cfm   5.  Maintain good sleep hygiene.  6.  Contact your doctor if you have any problems that may be related to the medicine you are taking.  7.  Call 911 and bring the patient back to the ED if:        A.  The seizure lasts longer than 5 minutes.       B.  The patient doesn't awaken shortly after the seizure  C.  The patient has new problems such as difficulty seeing, speaking or moving  D.  The patient was injured during the seizure  E.  The patient has a temperature over 102 F (39C)  F.  The patient vomited and now is having trouble breathing

## 2023-10-04 NOTE — Progress Notes (Signed)
 Virtual Visit via Video Note The purpose of this virtual visit is to provide medical care while limiting exposure to the novel coronavirus.    Consent was obtained for video visit:  Yes.   Answered questions that patient had about telehealth interaction:  Yes.    Pt location: Home Physician Location: office Name of referring provider:  [facility] I connected with Don Reed at patients initiation/request on 10/04/2023 at 11:30 AM EDT by video enabled telemedicine application and verified that I am speaking with the correct person using two identifiers. Pt MRN:  [id number] Pt DOB:  29-Oct-1985 Video Participants:  Don Reed   History of Present Illness:  The patient had a virtual video visit on 10/04/2023. He was last seen in the neurology clinic 6 months ago for seizures and migraines. Since his last visit, he contacted our office on 05/31/23 about 2 seizures that occurred in one day on 05/28/23. One occurred in the car, the second occurred in his bed. He vomited afterwards. Lamotrigine dose was increased to 125mg  BID. He is also on Epidiolex 5.2mL Bid, Levetiracetam 1500mg  BID, and Zonisamide 600mg  at bedtime. He denies any further seizures since 04/2023. No side effects on medications. On his last visit, he was reporting a change in headaches. 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 No hydrocephalus. He is prescribed Emgality and nortriptyline 100mg  at bedtime for migraine prophylaxis and states he stopped the [facility] because he is no longer having a lot of headaches. He has one every other month or so with good  response to prn Maxalt. He is also on Lyrica 50mg  BID prescribed by his PCP for fibromyalgia. He is sleeping well. Mood is good.    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 He states that at age 89, he was having episodes of slurred speech, falls, recurrent syncope, and headaches, and was found to have the brain tumor. He recalls when he was younger, his head kept turning to one side and he would try to stop it. After the surgery, he did fairly well except for frequent headaches, then had his first seizure at age 35 or 89. He describes seizures as starting with back pain, then he gets a metallic taste in his mouth, his head starts hurting more, then he gets disoriented and has been told he would become unresponsive then proceed to generalized shaking, at times with urinary incontinence. He lives with his fiancee and brother, and denies any isolated staring spells. He has been told he has seizures in his sleep. After a seizure, he denies any focal weakness, he can hear people around him but has difficulty getting his words out. He also has some nausea. He has fallen twice this year with his seizures, he was in the ER on 09/05/15 and 12/02/15 for seizure, during both ER visits, he would have a witnessed seizure in the ER. With the most recent seizure on 12/02/15, he had ran out of Vimpat. He has been taking Vimpat 100mg  BID since 2015, on Keppra 1500mg  BID since 2011. He recalls taking Depakote and  Topamax in the past, with cognitive side effects. He continues to report memory issues, "I can't think too good." He feels his left side is different, with numbness in his left arm and leg. He had cervical fusion surgery and has been told this is the cause of his left-sided symptoms. His last shunt revision was in 1999. 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    He has a history of chronic headaches, with throbbing  headaches occurring around twice a week over the frontal and temporal regions, with associated nausea, vomiting, photo/phonophobia. They would last for 1 to 1-1/2 days, he does not take any medication because he feels they have provoked seizures in the past. He reports taking an unrecalled headache preventative medication in [place] which was helpful. He denies any dizziness, bowel/bladder dysfunction, no myoclonic jerks. He has occasional twitching in an arm or leg. He has chronic neck and back pain.    Epilepsy Risk Factors:  His father had generalized convulsions, his mother had seizures as well. He has a history of benign brain tumor s/p VP shunt, last revision in 1999. He reports several head injuries with loss of consciousness, most recently when hit in the face by a ball a few years ago. Otherwise he had a normal birth and early development.  There is no history of febrile convulsions, CNS infections such as meningitis/encephalitis.   Prior AEDs: Depakote, Topamax, Vimpat  Diagnostic Data: MRI brain with and without contrast 07/27/2016: no acute changes seen. There were stable postsurgical changes in the posterior fossa related to resection of previous mass. 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 No recurrent mass or abnormal enhancement seen. There is a right transfrontal ventricular shunt seen terminating within the right ventricle. Hippocami symmetric.   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 No hydrocephalus   MRI cervical spine in 07/2017 showed moderate stenosis at C3-4, moderate foraminal  encroachment right greater than left at C3-4, no change from prior MRI. There is moderate right foraminal narrowing at C4-5 and mild left foraminal narrowing at C4-5, unchanged. 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     Current Outpatient Medications on File Prior to Visit  Medication Sig Dispense Refill   cannabidiol (EPIDIOLEX) 100 MG/ML solution Take 5.7mL twice a day 324 mL 5   celecoxib (CELEBREX) 100 MG capsule Take 100 mg by mouth 2 (two) times daily. As needed     Cholecalciferol 125 MCG (5000 UT) TABS Take by mouth.     Galcanezumab-gnlm (EMGALITY) 120 MG/ML SOAJ Inject 1 pen  into the skin every 30 (thirty) days. 1.12 mL 11   lamoTRIgine (LAMICTAL) 100 MG tablet Take 1 tablet (100 mg total) by mouth 2 (two) times daily. 180 tablet 3   lamoTRIgine (LAMICTAL) 25 MG tablet Take 1 tablet (25 mg total) by mouth 2 (two) times daily. 180 tablet 1   levETIRAcetam (KEPPRA) 500 MG tablet TAKE 3 TABLETS BY MOUTH TWICE DAILY 180 tablet 11   linaclotide (LINZESS) 145 MCG CAPS capsule Take 145 mcg by  mouth daily before breakfast.     nortriptyline (PAMELOR) 50 MG capsule TAKE 2 CAPSULES BY MOUTH EVERY NIGHT 180 capsule 2   pregabalin (LYRICA) 50 MG capsule Take 50 mg by mouth 2 (two) times daily.     PROVENTIL HFA 108 (90 BASE) MCG/ACT inhaler Inhale 2 puffs into the lungs every 6 (six) hours as needed for shortness of breath.   3   rizatriptan (MAXALT) 10 MG tablet Take 1 tablet at onset of migraine. Do not take more than 3 in a week 10 tablet 11   zonisamide (ZONEGRAN) 100 MG capsule Take 6 capsules at night 540 capsule 3   [DISCONTINUED] diphenhydramine-acetaminophen (TYLENOL PM) 25-500 MG TABS tablet Take 2 tablets by mouth at bedtime as needed (for sleep).      No current facility-administered medications on file prior to visit.     Observations/Objective:    Vitals:   10/04/23 1111  Weight: 230 lb (104.3 kg)  Height: 6\' 1"  (1.854 m)   GEN:  The patient appears stated age and is in NAD.  Neurological examination: Patient is awake, alert. No aphasia or dysarthria. Intact fluency and comprehension. Cranial nerves: Extraocular movements intact. No facial asymmetry. Motor: moves all extremities symmetrically, at least anti-gravity x 4.  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 EEG normal, repeat MRI brain 02/2023 no acute changes with postsurgical posterior fossa changes seen, right transfrontal shunt in place. He had initial good response to Epidiolex, seizure-free for for 2 years until 2 seizures in one day last 05/28/23. None since increase in Lamotrigine to 125mg  BID. He continues on Epidiolex 5.67mL BID, Levetiracetam 1500mg  BID, and Zonisamide 600mg  qhs. If seizures continue to be controlled on next visit, we may slowly wean down Zonisamide to streamline medications. Migraines controlled on nortriptyline 100mg  at bedtime, he stopped Emgality. He has prn Maxalt for rescue. He does not drive. Follow-up in 4 months, call for any changes.    Follow Up Instructions:   -I discussed the assessment and treatment plan with the patient. The patient was provided an opportunity to ask questions and all were answered. The patient agreed with the plan and demonstrated an understanding of the instructions.   The patient was advised to call back or seek an in-person evaluation if the symptoms worsen or if the condition fails to improve as anticipated.    Van Clines, MD

## 2023-10-04 NOTE — Progress Notes (Signed)
 Pt has had 2 seizure since last seen Dr Karel Jarvis,

## 2023-10-20 ENCOUNTER — Ambulatory Visit: Payer: Medicaid Other | Admitting: Neurology

## 2023-11-05 ENCOUNTER — Telehealth: Payer: Self-pay | Admitting: Neurology

## 2023-11-05 NOTE — Telephone Encounter (Signed)
 Pt. Would like an EMG as he is hurting in limbs and in arm. Numbness in hands and legs, please call to discuss

## 2023-11-09 NOTE — Telephone Encounter (Signed)
 Pt called an informed We cannot order this test without doing an exam on him, it has to in an in-person appointment and that we will add him to the wait list

## 2023-11-09 NOTE — Telephone Encounter (Signed)
 We cannot order this test without doing an exam on him, it has to in an in-person appointment. Ok for [employer], thanks

## 2023-11-19 ENCOUNTER — Encounter: Payer: Self-pay | Admitting: Neurology

## 2023-11-19 ENCOUNTER — Other Ambulatory Visit

## 2023-11-19 ENCOUNTER — Ambulatory Visit (INDEPENDENT_AMBULATORY_CARE_PROVIDER_SITE_OTHER): Admitting: Neurology

## 2023-11-19 VITALS — BP 121/81 | HR 67 | Ht 73.0 in | Wt 240.2 lb

## 2023-11-19 DIAGNOSIS — R52 Pain, unspecified: Secondary | ICD-10-CM

## 2023-11-19 NOTE — Patient Instructions (Addendum)
 Good to see you.  Have bloodwork done for TSH, B12, B1, ESR, CRP, SPEP, IFE, folate, ANA, SS-A, SS-B  Please discuss increasing Lyrica with your PCP  3. Continue all your medications  4. Follow-up as scheduled in August, call for any changes

## 2023-11-19 NOTE — Progress Notes (Signed)
 NEUROLOGY FOLLOW UP OFFICE NOTE  Don Reed [id number] 10-Jul-1985  HISTORY OF PRESENT ILLNESS: I had the pleasure of seeing Don Reed in follow-up in the neurology clinic on 11/19/2023. He is alone in the office today. The patient was last seen a month ago for seizures and migraines. He presents for an earlier visit for new symptoms of pain in limbs, arm, numbness in hands and legs. He states his whole body hurts. He has pain in his neck, back, elbows going down his hands, above the knees going down his feet. He describes it like an ache, at times burning. There is numbness in his hands and feet. This has affected his ability to workout. It does not hurt as much when he is moving, but when he stands in spot spot it really hurts. He cannot stand for prolonged periods. He wakes up in pain but states he can deal with that. He has tingling in his feet. He has occasional constipation. He drinks alcohol every once in a blue moon. EMG/NCV done in 2023 showed mild right ulnar neuropathy with slowing across the elbow. He also notes he is forgetting things, at work they tell him things, he thinks of it then forgets. He works for [employer]. He denies missing medications. He continues on Lamotrigine  125mg  BID, Epidiolex  5.37mL Bid, Levetiracetam  1500mg  BID, and Zonisamide  600mg  at bedtime. No seizures since 04/2023.    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 He states that at age 38, he was having episodes of slurred speech, falls, recurrent syncope, and headaches, and was found to have the brain tumor. He recalls when he was younger, his head kept turning to one side and he would try to stop it. After the surgery, he did fairly well except for frequent headaches, then had his first seizure at age 38 or 60. He describes seizures as starting with back pain, then he gets a metallic taste in  his mouth, his head starts hurting more, then he gets disoriented and has been told he would become unresponsive then proceed to generalized shaking, at times with urinary incontinence. He lives with his fiancee and brother, and denies any isolated staring spells. He has been told he has seizures in his sleep. After a seizure, he denies any focal weakness, he can hear people around him but has difficulty getting his words out. He also has some nausea. He has fallen twice this year with his seizures, he was in the ER on 09/05/15 and 12/01/37 for seizure, during both ER visits, he would have a witnessed seizure in the ER. With the most recent seizure on 12/01/37, he had ran out of Vimpat . He has been taking Vimpat  100mg  BID since 2015, on Keppra  1500mg  BID since 2011. He recalls taking Depakote and Topamax in the past, with cognitive side effects. He continues to report memory issues, "I can't think too good." He feels his left side is different, with numbness in his left arm and leg. He had cervical fusion surgery and has been told this is the cause of his left-sided symptoms. His last shunt revision was in 1999. 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    He has a history of chronic headaches, with throbbing headaches occurring around twice a week over the frontal and temporal regions, with associated nausea, vomiting, photo/phonophobia. They would  last for 1 to 1-1/2 days, he does not take any medication because he feels they have provoked seizures in the past. He reports taking an unrecalled headache preventative medication in [place]  which was helpful. He denies any dizziness, bowel/bladder dysfunction, no myoclonic jerks. He has occasional twitching in an arm or leg. He has chronic neck and back pain.    Epilepsy Risk Factors:  His father had generalized convulsions, his mother had seizures as well. He has a history of benign brain tumor s/p VP shunt, last revision in 1999. He reports several head  injuries with loss of consciousness, most recently when hit in the face by a ball a few years ago. Otherwise he had a normal birth and early development.  There is no history of febrile convulsions, CNS infections such as meningitis/encephalitis.   Prior AEDs: Depakote, Topamax, Vimpat   Diagnostic Data: MRI brain with and without contrast 07/27/2016: no acute changes seen. There were stable postsurgical changes in the posterior fossa related to resection of previous mass. 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 No recurrent mass or abnormal enhancement seen. There is a right transfrontal ventricular shunt seen terminating within the right ventricle. Hippocami symmetric.   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 No hydrocephalus   MRI cervical spine in 07/2017 showed moderate stenosis at C3-4, moderate foraminal encroachment right greater than left at C3-4, no change from prior MRI. There is moderate right foraminal narrowing at C4-5 and mild left foraminal narrowing at C4-5, unchanged. 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     PAST MEDICAL HISTORY: Past Medical History:  Diagnosis Date   Asthma    Cancer (HCC)    Chronic back pain    History of brain shunt    History of brain tumor    Hydrocephalus (HCC)    Legally Reed    Migraine    Seizures (HCC)    Vision loss     MEDICATIONS: Current Outpatient Medications on File Prior to Visit  Medication Sig  Dispense Refill   cannabidiol  (EPIDIOLEX ) 100 MG/ML solution Take 5.4mL twice a day 324 mL 5   celecoxib (CELEBREX) 100 MG capsule Take 100 mg by mouth 2 (two) times daily. As needed     Cholecalciferol 125 MCG (5000 UT) TABS Take by mouth.     lamoTRIgine  (LAMICTAL ) 100 MG tablet Take 1 tablet twice a day (take with 25mg  tablet for total of 125mg  twice a day) 180 tablet 3   lamoTRIgine  (LAMICTAL ) 25 MG tablet Take 1 tablet twice a day (take with 100mg  tablet for total of 125mg  twice a day) 180 tablet 3   levETIRAcetam  (KEPPRA ) 500 MG tablet TAKE 3 TABLETS BY MOUTH TWICE DAILY 180 tablet 11   linaclotide (LINZESS) 145 MCG CAPS capsule Take 145 mcg by mouth daily before breakfast.     Multiple Vitamin (MULTIVITAMIN) tablet Take 1 tablet by mouth daily.     nortriptyline  (PAMELOR ) 50 MG capsule TAKE 2 CAPSULES BY MOUTH EVERY NIGHT 180 capsule 2   pregabalin (LYRICA) 50 MG capsule  Take 50 mg by mouth 2 (two) times daily.     PROVENTIL HFA 108 (90 BASE) MCG/ACT inhaler Inhale 2 puffs into the lungs every 6 (six) hours as needed for shortness of breath.   3   rizatriptan  (MAXALT ) 10 MG tablet Take 1 tablet at onset of migraine. Do not take more than 3 in a week 10 tablet 11   zonisamide  (ZONEGRAN ) 100 MG capsule Take 6 capsules at night 540 capsule 3   [DISCONTINUED] diphenhydramine -acetaminophen  (TYLENOL  PM) 25-500 MG TABS tablet Take 2 tablets by mouth at bedtime as needed (for sleep).      No current facility-administered medications on file prior to visit.    ALLERGIES: Allergies  Allergen Reactions   Amoxicillin Itching and Rash    Has patient had a PCN reaction causing immediate rash, facial/tongue/throat swelling, SOB or lightheadedness with hypotension: Yes Has patient had a PCN reaction causing severe rash involving mucus membranes or skin necrosis: No Has patient had a PCN reaction that required hospitalization No Has patient had a PCN reaction occurring within the last 10 years:  Yes If all of the above answers are "NO", then may proceed with Cephalosporin use.    Shellfish Allergy Anaphylaxis, Hives, Shortness Of Breath and Rash    Cannot breathe    Vancomycin Itching and Rash    FAMILY HISTORY: Family History  Problem Relation Age of Onset   Hypertension Mother    Seizures Mother    Cataracts Mother    Stroke Mother    Stroke Father    Seizures Father    Scoliosis Sister    Vision loss Sister    Vision loss Brother    Diabetes Brother    Leukemia Brother     SOCIAL HISTORY: Social History   Socioeconomic History   Marital status: Single    Spouse name: Not on file   Number of children: 2   Years of education: GED   Highest education level: Not on file  Occupational History    Employer: [employer]  Tobacco Use   Smoking status: Never   Smokeless tobacco: Never  Vaping Use   Vaping status: Never Used  Substance and Sexual Activity   Alcohol use: Yes    Comment: wine   Drug use: No   Sexual activity: Not on file  Other Topics Concern   Not on file  Social History Narrative   Patient lives at home with family.   Caffeine  Use: 1-3 time weekly   Right handed   Social Drivers of Corporate investment banker Strain: Not on file  Food Insecurity: Not on file  Transportation Needs: Not on file  Physical Activity: Not on file  Stress: Not on file  Social Connections: Unknown (10/31/2021)   [facility]   Social Network    Social Network: Not on file  Intimate Partner Violence: Unknown (10/01/2021)   [facility]   HITS    Physically Hurt: Not on file    Insult or Talk Down To: Not on file    Threaten Physical Harm: Not on file    Scream or Curse: Not on file     PHYSICAL EXAM: Vitals:   11/19/23 1542  BP: 121/81  Pulse: 67  SpO2: 98%   General: No acute distress Head:  Normocephalic/atraumatic Skin/Extremities: No rash, no edema Neurological Exam: alert  and awake. No aphasia or dysarthria. Fund of knowledge is appropriate.   Attention  and concentration are normal.   Cranial nerves: Pupils equal, round.  Extraocular movements intact with right-beating nystagmus on primary gaze, more prominent on right gaze (similar to prior). Visual fields full.  No facial asymmetry.  Motor: Bulk and tone normal, muscle strength 5/5 throughout with no pronator drift.  Sensation intact to all modalities on both UE, decreased cold to knees, decreased pin around knees. Intact vibration sense. Reflexes +1 both UE, +2 both LE. Finger to nose testing intact.  Gait narrow-based and steady, able to tandem walk adequately.  Romberg negative.    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 He presents for evaluation of new symptoms of whole body pain, numbness in hands and legs. Discussed that with history of fibromyalgia, most likely whole body pain is related to this rather than neuropathy. We will do bloodwork for TSH, B12, B1, ESR, CRP, SPEP, IFE, folate, ANA, SS-A, SS-B. He was advised to discuss increasing dose of Lyrica with his PCP. Continue seizure medications. Follow-up as scheduled in August, call for any changes.    Thank you for allowing me to participate in his care.  Please do not hesitate to call for any questions or concerns.    Rayfield Cairo, M.D.   CC: [facility]

## 2023-11-25 LAB — FOLATE: Folate: 20.2 ng/mL

## 2023-11-25 LAB — C-REACTIVE PROTEIN: CRP: 3.7 mg/L (ref <8.0)

## 2023-11-25 LAB — ANA, IFA COMPREHENSIVE PANEL
Anti Nuclear Antibody (ANA): NEGATIVE
ENA SM Ab Ser-aCnc: <1 AI
SM/RNP: <1 AI
SSA (Ro) (ENA) Antibody, IgG: <1 AI
SSB (La) (ENA) Antibody, IgG: <1 AI
Scleroderma (Scl-70) (ENA) Antibody, IgG: <1 AI
ds DNA Ab: <1 [IU]/mL

## 2023-11-25 LAB — IMMUNOFIXATION ELECTROPHORESIS
IgG (Immunoglobin G), Serum: 1740 mg/dL — ABNORMAL HIGH (ref 600–1640)
IgM, Serum: 66 mg/dL (ref 50–300)
Immunoglobulin A: 147 mg/dL (ref 47–310)

## 2023-11-25 LAB — SEDIMENTATION RATE: Sed Rate: 6 mm/h (ref 0–15)

## 2023-11-25 LAB — VITAMIN B12: Vitamin B-12: 482 pg/mL (ref 200–1100)

## 2023-11-25 LAB — TSH: TSH: 0.64 m[IU]/L (ref 0.40–4.50)

## 2023-11-25 LAB — VITAMIN B1: Vitamin B1 (Thiamine): 15 nmol/L (ref 8–30)

## 2023-12-02 ENCOUNTER — Ambulatory Visit: Payer: Self-pay | Admitting: Neurology

## 2023-12-24 ENCOUNTER — Other Ambulatory Visit: Payer: Self-pay

## 2023-12-24 ENCOUNTER — Emergency Department (HOSPITAL_COMMUNITY): Admission: EM | Admit: 2023-12-24 | Discharge: 2023-12-25 | Disposition: A | Discharge status: Home / Self Care

## 2023-12-24 ENCOUNTER — Emergency Department (HOSPITAL_COMMUNITY)

## 2023-12-24 ENCOUNTER — Encounter (HOSPITAL_COMMUNITY): Payer: Self-pay | Admitting: Emergency Medicine

## 2023-12-24 DIAGNOSIS — R569 Unspecified convulsions: Secondary | ICD-10-CM | POA: Diagnosis present

## 2023-12-24 DIAGNOSIS — Z85841 Personal history of malignant neoplasm of brain: Secondary | ICD-10-CM | POA: Insufficient documentation

## 2023-12-24 DIAGNOSIS — J45909 Unspecified asthma, uncomplicated: Secondary | ICD-10-CM | POA: Insufficient documentation

## 2023-12-24 DIAGNOSIS — G40919 Epilepsy, unspecified, intractable, without status epilepticus: Secondary | ICD-10-CM | POA: Diagnosis present

## 2023-12-24 LAB — TROPONIN I (HIGH SENSITIVITY): Troponin I (High Sensitivity): 4 ng/L (ref <18)

## 2023-12-24 LAB — COMPREHENSIVE METABOLIC PANEL WITH GFR
ALT: 59 U/L — ABNORMAL HIGH (ref 0–44)
AST: 50 U/L — ABNORMAL HIGH (ref 15–41)
Albumin: 4.3 g/dL (ref 3.5–5.0)
Alkaline Phosphatase: 40 U/L (ref 38–126)
Anion gap: 11 (ref 5–15)
BUN: 16 mg/dL (ref 6–20)
CO2: 23 mmol/L (ref 22–32)
Calcium: 9.4 mg/dL (ref 8.9–10.3)
Chloride: 104 mmol/L (ref 98–111)
Creatinine, Ser: 1.38 mg/dL — ABNORMAL HIGH (ref 0.61–1.24)
GFR, Estimated: >60 mL/min (ref >60)
Glucose, Bld: 109 mg/dL — ABNORMAL HIGH (ref 70–99)
Potassium: 3.4 mmol/L — ABNORMAL LOW (ref 3.5–5.1)
Sodium: 138 mmol/L (ref 135–145)
Total Bilirubin: 0.5 mg/dL (ref 0.0–1.2)
Total Protein: 7.8 g/dL (ref 6.5–8.1)

## 2023-12-24 LAB — CBC WITH DIFFERENTIAL/PLATELET
Abs Immature Granulocytes: 0.03 10*3/uL (ref 0.00–0.07)
Basophils Absolute: 0.1 10*3/uL (ref 0.0–0.1)
Basophils Relative: 1 %
Eosinophils Absolute: 0.2 10*3/uL (ref 0.0–0.5)
Eosinophils Relative: 2 %
HCT: 39.2 % (ref 39.0–52.0)
Hemoglobin: 12.6 g/dL — ABNORMAL LOW (ref 13.0–17.0)
Immature Granulocytes: 0 %
Lymphocytes Relative: 23 %
Lymphs Abs: 2 10*3/uL (ref 0.7–4.0)
MCH: 25.6 pg — ABNORMAL LOW (ref 26.0–34.0)
MCHC: 32.1 g/dL (ref 30.0–36.0)
MCV: 79.7 fL — ABNORMAL LOW (ref 80.0–100.0)
Monocytes Absolute: 0.8 10*3/uL (ref 0.1–1.0)
Monocytes Relative: 9 %
Neutro Abs: 5.6 10*3/uL (ref 1.7–7.7)
Neutrophils Relative %: 65 %
Platelets: 250 10*3/uL (ref 150–400)
RBC: 4.92 MIL/uL (ref 4.22–5.81)
RDW: 13.7 % (ref 11.5–15.5)
WBC: 8.7 10*3/uL (ref 4.0–10.5)
nRBC: 0 % (ref 0.0–0.2)

## 2023-12-24 MED ORDER — CANNABIDIOL 100 MG/ML PO SOLN: 540.0000 mg | Freq: Once | ORAL | Status: DC

## 2023-12-24 MED ORDER — ZONISAMIDE 100 MG PO CAPS
600.0000 mg | ORAL_CAPSULE | Freq: Every day | ORAL | Status: DC
  Filled 2023-12-24: qty 6

## 2023-12-24 MED ORDER — LEVETIRACETAM (KEPPRA) 500 MG/5 ML ADULT IV PUSH
1500.0000 mg | Freq: Once | INTRAVENOUS | Status: AC
  Administered 2023-12-24: 1500 mg via INTRAVENOUS
  Filled 2023-12-24: qty 15

## 2023-12-24 MED ORDER — LAMOTRIGINE 25 MG PO TABS
150.0000 mg | ORAL_TABLET | Freq: Once | ORAL | Status: AC
  Administered 2023-12-25: 150 mg via ORAL
  Filled 2023-12-24: qty 2

## 2023-12-24 MED ORDER — LORAZEPAM 2 MG/ML IJ SOLN
2.0000 mg | Freq: Once | INTRAMUSCULAR | Status: AC
  Administered 2023-12-24: 2 mg via INTRAVENOUS
  Filled 2023-12-24: qty 1

## 2023-12-24 NOTE — ED Triage Notes (Signed)
 Patient was found in the car at [employer]. He had a seizure which lasted about 8 mins. Now he is A&O x 4. Last seizure was possibly last month. Pregabalin was increased from  50 mg to 75 mg recently.    Hx: VP shunt, Brain tumor, Seizures   EMS vitals: 111 CBG 120/60 BP 70 HR 100% SPO2 on room air

## 2023-12-24 NOTE — ED Provider Notes (Signed)
 [place] EMERGENCY DEPARTMENT AT [facility] Provider Note   CSN: [id number] Arrival date & time: 12/24/23  8171     Patient presents with: No chief complaint on file.   Don Reed is a 38 y.o. male.  {Add pertinent medical, surgical, social history, OB history to YEP:67052} Patient with history of fibromyalgia, VP shunt placement, brain tumor status post resection at age 17, seizures presents today with complaints of seizure. He states that same occurred approximately 1 hour ago, wife was present and states that it lasted 8 minutes 43 seconds. He was seated in a car when this occurred and denies any injuries. She notes that his seizures normally last less than 2 minutes which is why she called EMS. He states that today he was having back pain and a headache which is how he always feels before he has a seizure. Currently he is alert and oriented. States that he has about 1-2 breakthrough seizures per year, last was June 5 and before that was Thanksgiving 2024. He reports that he took his seizure medication today and his wife corroborates the story. He denies fevers, chills, vision changes, chest pain, shortness of breath, nausea, vomiting, abdominal pain, or urinary symptoms.   The history is provided by the patient and the spouse. No language interpreter was used.       Prior to Admission medications   Medication Sig Start Date End Date Taking? Authorizing Provider  cannabidiol  (EPIDIOLEX ) 100 MG/ML solution Take 5.4mL twice a day 10/04/23   Georjean Darice HERO, MD  celecoxib (CELEBREX) 100 MG capsule Take 100 mg by mouth 2 (two) times daily. As needed    [provider]  Cholecalciferol 125 MCG (5000 UT) TABS Take by mouth.    [provider]  lamoTRIgine  (LAMICTAL ) 100 MG tablet Take 1 tablet twice a day (take with 25mg  tablet for total of 125mg  twice a day) 10/04/23   Georjean Darice HERO, MD  lamoTRIgine  (LAMICTAL ) 25 MG tablet Take 1 tablet twice a day (take  with 100mg  tablet for total of 125mg  twice a day) 10/04/23   Georjean Darice HERO, MD  levETIRAcetam  (KEPPRA ) 500 MG tablet TAKE 3 TABLETS BY MOUTH TWICE DAILY 10/04/23   Georjean Darice HERO, MD  linaclotide [facility]) 145 MCG CAPS capsule Take 145 mcg by mouth daily before breakfast.    [provider]  Multiple Vitamin (MULTIVITAMIN) tablet Take 1 tablet by mouth daily.    [provider]  nortriptyline  (PAMELOR ) 50 MG capsule TAKE 2 CAPSULES BY MOUTH EVERY NIGHT 10/04/23   Georjean Darice HERO, MD  pregabalin (LYRICA) 50 MG capsule Take 50 mg by mouth 2 (two) times daily. 12/02/22   [provider]  PROVENTIL HFA 108 (90 BASE) MCG/ACT inhaler Inhale 2 puffs into the lungs every 6 (six) hours as needed for shortness of breath.  11/13/14   [provider]  rizatriptan  (MAXALT ) 10 MG tablet Take 1 tablet at onset of migraine. Do not take more than 3 in a week 10/04/23   Georjean Darice HERO, MD  zonisamide  (ZONEGRAN ) 100 MG capsule Take 6 capsules at night 10/04/23   Georjean Darice HERO, MD  diphenhydramine -acetaminophen  (TYLENOL  PM) 25-500 MG TABS tablet Take 2 tablets by mouth at bedtime as needed (for sleep).   12/04/19  [provider]    Allergies: Amoxicillin, Shellfish allergy, and Vancomycin    Review of Systems  Neurological:  Positive for seizures.  All other systems reviewed and are negative.   Updated  Vital Signs BP 125/85 (BP Location: Right Arm)   Pulse 68   Temp 97.6 F (36.4 C) (Oral)   Resp 12   SpO2 100%   Physical Exam Vitals and nursing note reviewed.  Constitutional:      General: He is not in acute distress.    Appearance: Normal appearance. He is normal weight. He is not ill-appearing, toxic-appearing or diaphoretic.  HENT:     Head: Normocephalic and atraumatic.   Eyes:     Extraocular Movements: Extraocular movements intact.     Pupils: Pupils are equal, round, and reactive to light.    Cardiovascular:     Rate and Rhythm: Normal rate and  regular rhythm.     Heart sounds: Normal heart sounds.  Pulmonary:     Effort: Pulmonary effort is normal. No respiratory distress.     Breath sounds: Normal breath sounds.  Abdominal:     General: Abdomen is flat.     Palpations: Abdomen is soft.     Tenderness: There is no abdominal tenderness.   Musculoskeletal:        General: Normal range of motion.     Cervical back: Normal range of motion and neck supple.   Skin:    General: Skin is warm and dry.   Neurological:     General: No focal deficit present.     Mental Status: He is alert and oriented to person, place, and time.     GCS: GCS eye subscore is 4. GCS verbal subscore is 5. GCS motor subscore is 6.     Sensory: Sensation is intact.     Motor: Motor function is intact.     Coordination: Coordination is intact.     Gait: Gait is intact.     Comments: Alert and oriented to self, place, time and event.    Speech is fluent, clear without dysarthria or dysphasia.    Strength 5/5 in upper/lower extremities   Sensation intact in upper/lower extremities    CN I not tested  CN II grossly intact visual fields bilaterally. Did not visualize posterior eye.  CN III, IV, VI PERRLA and EOMs intact bilaterally  CN V Intact sensation to sharp and light touch to the face  CN VII facial movements symmetric  CN VIII not tested  CN IX, X no uvula deviation, symmetric rise of soft palate  CN XI 5/5 SCM and trapezius strength bilaterally  CN XII Midline tongue protrusion, symmetric L/R movements   Psychiatric:        Mood and Affect: Mood normal.        Behavior: Behavior normal.     (all labs ordered are listed, but only abnormal results are displayed) Labs Reviewed - No data to display  EKG: None  Radiology: No results found.  {Document cardiac monitor, telemetry assessment procedure when appropriate:32947} Procedures   Medications Ordered in the ED - No data to display  Clinical Course as of 12/24/23 2009  Fri Dec 24, 2023  2006 Patient with seizure like activity noted around 7:40PM, resolved without medication. Was apparently complaining of chest pain as well. Currently postictal. Will give Keppra  and ativan   [SS]    Clinical Course User Index [SS] Meilin Brosh, Lauraine LABOR, PA-C   {Click here for ABCD2, HEART and other calculators REFRESH Note before signing:1}  Medical Decision Making Amount and/or Complexity of Data Reviewed Labs: ordered. Radiology: ordered.  Risk Prescription drug management.   This patient is a 38 y.o. male who presents to the ED for concern of seizure, this involves an extensive number of treatment options, and is a complaint that carries with it a high risk of complications and morbidity. The emergent differential diagnosis prior to evaluation includes, but is not limited to,  The differential diagnosis for includes but is not limited to idiopathic seizure, traumatic brain injury, intracranial hemorrhage, vascular lesion, mass or space containing lesion, degenerative neurologic disease, congenital brain abnormality, infectious etiology such as meningitis, encephalitis or abscess, metabolic disturbance including hyper or hypoglycemia, hyper or hyponatremia, hyperosmolar state, uremia, hepatic failure, hypocalcemia, hypomagnesemia.  Toxic substances such as cocaine, lidocaine , antidepressants, theophylline, alcohol withdrawal, drug withdrawal, eclampsia, hypertensive encephalopathy and anoxic brain injury.   This is not an exhaustive differential.   Past Medical History / Co-morbidities / Social History:  has a past medical history of Asthma, Cancer (HCC), Chronic back pain, History of brain shunt, History of brain tumor, Hydrocephalus (HCC), Legally blind, Migraine, Seizures (HCC), and Vision loss.  Additional history: Chart reviewed. Pertinent results include: follows with neurology Dr. Georjean, last saw her on 5/23. Takes Vimpat  and Keppra   Physical  Exam: Physical exam performed. The pertinent findings include: alert and oriented and neurologically intact without focal deficits  Lab Tests: I ordered, and personally interpreted labs.  The pertinent results include:  hgb 12.6, K 3.4, creatinine 1.38 consistent with previous. AST 50, ALT 59   Imaging Studies: I ordered imaging studies including CXR. I independently visualized and interpreted imaging which showed NAD. I agree with the radiologist interpretation.  Medications: I ordered medication including keppra , ativan   for seizure. Reevaluation of the patient after these medicines showed that the patient improved. I have reviewed the patients home medicines and have made adjustments as needed.  Consultations Obtained: I requested consultation with the neurology on call Dr. Jerrie,  and discussed lab and imaging findings as well as pertinent plan - they recommend: their recommendations are pending at shift change   Disposition:  Given reported seizure that is longer than usual at 8 minutes duration and subsequent seizure here, neurology consulted for recommendations. Neurology consult is pending at shift change and will determine disposition.  Care handoff to Olam Slocumb, PA-C at shift change. Please see their note for continued evaluation and dispo  I discussed this case with my attending physician Dr. Gennaro who cosigned this note including patient's presenting symptoms, physical exam, and planned diagnostics and interventions. Attending physician stated agreement with plan or made changes to plan which were implemented.    Final diagnoses:  Seizures [facility])    ED Discharge Orders     None

## 2023-12-24 NOTE — ED Provider Notes (Incomplete)
  Spoke with neurology, Dr. Jerrie-- recommends to increase his lamotrigine  to 150 mg BID, continue all another meds as prescribed and have him follow up with his outpatient neurologist.  Also recommended to go ahead and give home meds.  Patient currently awake/alert.  At baseline per wife.  She is comfortable with care plan.  Night meds ordered and given.  Levels have been sent to help with OP follow-up.  Strict return precautions given for any new/acute changes.  1:32 AM At time of discharge patient projectile vomited.  May be due to large amount of meds given.  Chart reviewed once more, does appear he has a VP shunt.  Will go ahead and obtain CT head as this not done earlier.    2:44 AM CT head without acute findings.  Patient without further emesis.  Tolerated PO.  Feel he is stable for discharge with plan as previous.  Wife requesting some zofran  be sent in as well which has been done.  Can return here for new concerns.   Don Olam HERO, PA-C 12/25/23 0043    Don Olam HERO, PA-C 12/25/23 9747    Don Charmaine FALCON, MD 12/25/23 [phone]

## 2023-12-24 NOTE — Plan of Care (Signed)
 I am asked for recommendations for this patient who had a couple of breakthrough seizures today without clear etiology  Per outpatient neurology notes, current seizure medications are: Lamotrigine  to 125mg  BID. He continues on Epidiolex  5.4mL BID, Levetiracetam  1500mg  BID, and Zonisamide  600mg  qhs.   Reportedly patient is noting adherence to all of his medications  Lamotrigine  level and zonisamide  level ordered to be followed up by outpatient neurologist as they will take time to result  Would make sure patient receives all of his scheduled nighttime medications ASAP to avoid missed doses prior to getting home  Additionally would increase lamotrigine  to 150 twice daily and follow-up closely with outpatient neurology  Okay to hold off on head imaging if back to baseline without any new deficits   Basic Metabolic Panel: Recent Labs  Lab 12/24/23 2008  NA 138  K 3.4*  CL 104  CO2 23  GLUCOSE 109*  BUN 16  CREATININE 1.38*  CALCIUM 9.4    CBC: Recent Labs  Lab 12/24/23 2008  WBC 8.7  NEUTROABS 5.6  HGB 12.6*  HCT 39.2  MCV 79.7*  PLT 250    Coagulation Studies: No results for input(s): LABPROT, INR in the last 72 hours.   Current vital signs: BP 122/76 (BP Location: Left Arm)   Pulse 64   Temp 97.6 F (36.4 C) (Oral)   Resp 14   SpO2 97%  Vital signs in last 24 hours: Temp:  [97.6 F (36.4 C)] 97.6 F (36.4 C) (06/27 1839) Pulse Rate:  [64-68] 64 (06/27 2218) Resp:  [12-14] 14 (06/27 2218) BP: (122-125)/(76-85) 122/76 (06/27 2218) SpO2:  [97 %-100 %] 97 % (06/27 2218)   10 min of care, majority in discussion with ED provider via secure chat who will notify patient of interprofessional consultation

## 2023-12-25 ENCOUNTER — Emergency Department (HOSPITAL_COMMUNITY)

## 2023-12-25 LAB — TROPONIN I (HIGH SENSITIVITY): Troponin I (High Sensitivity): 5 ng/L (ref <18)

## 2023-12-25 MED ORDER — ONDANSETRON 4 MG PO TBDP: 4.0000 mg | ORAL_TABLET | Freq: Three times a day (TID) | ORAL | 0 refills | Status: AC | PRN

## 2023-12-25 NOTE — Discharge Instructions (Addendum)
 Our hospital neurologist has recommended that you increase your lamotrigine  to 150 mg twice daily, continue all other medications as prescribed. You will need close follow-up with your outpatient neurologist. Please return here for any new or acute changes.

## 2023-12-26 ENCOUNTER — Emergency Department (HOSPITAL_COMMUNITY)
Admission: EM | Admit: 2023-12-26 | Discharge: 2023-12-27 | Disposition: A | Discharge status: Home / Self Care | Attending: Emergency Medicine | Admitting: Emergency Medicine

## 2023-12-26 ENCOUNTER — Emergency Department (HOSPITAL_COMMUNITY)

## 2023-12-26 ENCOUNTER — Encounter (HOSPITAL_COMMUNITY): Payer: Self-pay | Admitting: Emergency Medicine

## 2023-12-26 DIAGNOSIS — J45909 Unspecified asthma, uncomplicated: Secondary | ICD-10-CM | POA: Diagnosis not present

## 2023-12-26 DIAGNOSIS — R519 Headache, unspecified: Secondary | ICD-10-CM | POA: Insufficient documentation

## 2023-12-26 DIAGNOSIS — R7401 Elevation of levels of liver transaminase levels: Secondary | ICD-10-CM | POA: Diagnosis not present

## 2023-12-26 DIAGNOSIS — M545 Low back pain, unspecified: Secondary | ICD-10-CM | POA: Insufficient documentation

## 2023-12-26 DIAGNOSIS — R944 Abnormal results of kidney function studies: Secondary | ICD-10-CM | POA: Insufficient documentation

## 2023-12-26 MED ORDER — SODIUM CHLORIDE 0.9 % IV BOLUS
1000.0000 mL | Freq: Once | INTRAVENOUS | Status: AC
  Administered 2023-12-27: 1000 mL via INTRAVENOUS

## 2023-12-26 MED ORDER — DIPHENHYDRAMINE HCL 50 MG/ML IJ SOLN
12.5000 mg | Freq: Once | INTRAMUSCULAR | Status: AC
Start: 2023-12-26 — End: 2023-12-27
  Administered 2023-12-27: 12.5 mg via INTRAVENOUS
  Filled 2023-12-26: qty 1

## 2023-12-26 MED ORDER — DEXAMETHASONE SODIUM PHOSPHATE 10 MG/ML IJ SOLN
10.0000 mg | Freq: Once | INTRAMUSCULAR | Status: AC
Start: 2023-12-26 — End: 2023-12-27
  Administered 2023-12-27: 10 mg via INTRAVENOUS
  Filled 2023-12-26: qty 1

## 2023-12-26 MED ORDER — METOCLOPRAMIDE HCL 5 MG/ML IJ SOLN
10.0000 mg | Freq: Once | INTRAMUSCULAR | Status: AC
Start: 2023-12-26 — End: 2023-12-27
  Administered 2023-12-27: 10 mg via INTRAVENOUS
  Filled 2023-12-26: qty 2

## 2023-12-26 MED ORDER — MAGNESIUM SULFATE 2 GM/50ML IV SOLN
2.0000 g | Freq: Once | INTRAVENOUS | Status: AC
  Administered 2023-12-27: 2 g via INTRAVENOUS
  Filled 2023-12-26: qty 50

## 2023-12-26 NOTE — ED Provider Notes (Signed)
 [place] EMERGENCY DEPARTMENT AT [facility] Provider Note   CSN: [id number] Arrival date & time: 12/26/23  2057     Patient presents with: Headache and Back Pain   Don Reed is a 38 y.o. male.  {Add pertinent medical, surgical, social history, OB history to HPI:6410} 38 year old male with history of migraines, hydrocephalus, brain tumor treated with resection and shunt, presents with significant other for intractable headache.  Patient had a seizure on Friday while sitting in a vehicle, was evaluated in the [place] emergency room at that time.  He had a second seizure while in the [place] ER.  Ultimately, discharged at 3 AM and while walking back into his house he fell backwards into his significant other, hit the brick wall next to him with another seizure.  Patient had onset of headache on Friday which improved with his Maxalt .  He has been taking his Maxalt  since that time without any improvement in his headache.  He has been nauseous but able to keep down bread and his medications with Zofran  provide in the ER Friday night.  Significant other notes that his speech is slurred at times otherwise, he has a normal gait and is able to perform ADLs independently.  Patient reports global headache and left lower back pain.  No back pain at this time. No reports of fever. Lamictal  increased at his Friday ER visit.        Prior to Admission medications   Medication Sig Start Date End Date Taking? Authorizing Provider  cannabidiol  (EPIDIOLEX ) 100 MG/ML solution Take 5.4mL twice a day 10/04/23   Georjean Darice HERO, MD  celecoxib (CELEBREX) 100 MG capsule Take 100 mg by mouth 2 (two) times daily. As needed    [provider]  Cholecalciferol 125 MCG (5000 UT) TABS Take by mouth.    [provider]  lamoTRIgine  (LAMICTAL ) 100 MG tablet Take 1 tablet twice a day (take with 25mg  tablet for total of 125mg  twice a day) 10/04/23   Georjean Darice HERO, MD  lamoTRIgine   (LAMICTAL ) 25 MG tablet Take 1 tablet twice a day (take with 100mg  tablet for total of 125mg  twice a day) 10/04/23   Georjean Darice HERO, MD  levETIRAcetam  (KEPPRA ) 500 MG tablet TAKE 3 TABLETS BY MOUTH TWICE DAILY 10/04/23   Georjean Darice HERO, MD  linaclotide [facility]) 145 MCG CAPS capsule Take 145 mcg by mouth daily before breakfast.    [provider]  Multiple Vitamin (MULTIVITAMIN) tablet Take 1 tablet by mouth daily.    [provider]  nortriptyline  (PAMELOR ) 50 MG capsule TAKE 2 CAPSULES BY MOUTH EVERY NIGHT 10/04/23   Georjean Darice HERO, MD  ondansetron  (ZOFRAN -ODT) 4 MG disintegrating tablet Take 1 tablet (4 mg total) by mouth every 8 (eight) hours as needed. 12/25/23   Jarold Olam HERO, PA-C  pregabalin (LYRICA) 50 MG capsule Take 50 mg by mouth 2 (two) times daily. 12/02/22   [provider]  PROVENTIL HFA 108 (90 BASE) MCG/ACT inhaler Inhale 2 puffs into the lungs every 6 (six) hours as needed for shortness of breath.  11/13/14   [provider]  rizatriptan  (MAXALT ) 10 MG tablet Take 1 tablet at onset of migraine. Do not take more than 3 in a week 10/04/23   Georjean Darice HERO, MD  zonisamide  (ZONEGRAN ) 100 MG capsule Take 6 capsules at night 10/04/23   Georjean Darice HERO, MD  diphenhydramine -acetaminophen  (TYLENOL  PM) 25-500 MG TABS tablet Take 2 tablets by mouth  at bedtime as needed (for sleep).   12/04/19  [provider]    Allergies: Amoxicillin, Shellfish allergy, and Vancomycin    Review of Systems Negative except as per HPI Updated Vital Signs BP 122/83 (BP Location: Left Arm)   Pulse 61   Temp 97.9 F (36.6 C) (Oral)   Resp 16   SpO2 100%   Physical Exam Vitals and nursing note reviewed.  Constitutional:      General: He is not in acute distress.    Appearance: He is well-developed. He is not diaphoretic.  HENT:     Head: Normocephalic and atraumatic.     Mouth/Throat:     Mouth: Mucous membranes are moist.   Cardiovascular:     Rate and Rhythm:  Normal rate and regular rhythm.     Heart sounds: Normal heart sounds.  Pulmonary:     Effort: Pulmonary effort is normal.     Breath sounds: Normal breath sounds.   Musculoskeletal:     Cervical back: Neck supple.   Skin:    General: Skin is warm and dry.   Neurological:     Mental Status: He is alert and oriented to person, place, and time.     GCS: GCS eye subscore is 4. GCS verbal subscore is 5. GCS motor subscore is 6.     Cranial Nerves: No cranial nerve deficit or facial asymmetry.     Sensory: Sensation is intact.     Motor: No weakness, tremor, abnormal muscle tone or pronator drift.     Coordination: Finger-Nose-Finger Test abnormal. Heel to Shin Test normal.     Comments: Difficulty with finger to nose in right visual field  Psychiatric:        Behavior: Behavior normal.     (all labs ordered are listed, but only abnormal results are displayed) Labs Reviewed  CBC WITH DIFFERENTIAL/PLATELET  COMPREHENSIVE METABOLIC PANEL WITH GFR  PROTIME-INR  LAMOTRIGINE  LEVEL  LEVETIRACETAM  LEVEL    EKG: None  Radiology: CT Head Wo Contrast Result Date: 12/25/2023 CLINICAL DATA:  Seizure disorder, clinical change VP shunt. seizures, vomiting EXAM: CT HEAD WITHOUT CONTRAST TECHNIQUE: Contiguous axial images were obtained from the base of the skull through the vertex without intravenous contrast. RADIATION DOSE REDUCTION: This exam was performed according to the departmental dose-optimization program which includes automated exposure control, adjustment of the mA and/or kV according to patient size and/or use of iterative reconstruction technique. COMPARISON:  CT head 02/29/2016. FINDINGS: Brain: Right frontal approach VP shunt catheter with the tip near the foramen of [place]. Unchanged ventricular size with decompressed right lateral ventricle. No hydrocephalus. No evidence of acute large vascular territory infarct, mass lesion or midline shift. Chronic cerebellar hypoplasia with  dystrophic calcifications along midline and also along the tentorial leaflets. Vascular: No hyperdense vessel. Skull: No acute fracture.  Right frontal burr hole. Sinuses/Orbits: Clear sinuses.  No acute orbital findings. Other: No mastoid effusions. IMPRESSION: 1. No evidence of acute intracranial abnormality. 2. Unchanged ventricular size with right VP shunt in place. Electronically Signed   By: Gilmore GORMAN Molt M.D.   On: 12/25/2023 02:20    {Document cardiac monitor, telemetry assessment procedure when appropriate:32947} Procedures   Medications Ordered in the ED  sodium chloride  0.9 % bolus 1,000 mL (has no administration in time range)  metoCLOPramide  (REGLAN ) injection 10 mg (has no administration in time range)  dexamethasone  (DECADRON ) injection 10 mg (has no administration in time range)  diphenhydrAMINE  (BENADRYL ) injection 12.5 mg (has no administration  in time range)  magnesium sulfate IVPB 2 g 50 mL (has no administration in time range)      {Click here for ABCD2, HEART and other calculators REFRESH Note before signing:1}                              Medical Decision Making Amount and/or Complexity of Data Reviewed Labs: ordered. Radiology: ordered.  Risk Prescription drug management.   ***  {Document critical care time when appropriate  Document review of labs and clinical decision tools ie CHADS2VASC2, etc  Document your independent review of radiology images and any outside records  Document your discussion with family members, caretakers and with consultants  Document social determinants of health affecting pt's care  Document your decision making why or why not admission, treatments were needed:32947:::1}   Final diagnoses:  None    ED Discharge Orders     None

## 2023-12-26 NOTE — ED Notes (Signed)
 Patient transported to MRI

## 2023-12-26 NOTE — ED Notes (Signed)
 Patient transported to CT

## 2023-12-26 NOTE — ED Triage Notes (Signed)
 Pt here from home with c/o head pain and back pain since he had a second seizure on sat , pt has history of same

## 2023-12-27 ENCOUNTER — Encounter (HOSPITAL_COMMUNITY): Payer: Self-pay

## 2023-12-27 ENCOUNTER — Emergency Department (HOSPITAL_COMMUNITY)

## 2023-12-27 ENCOUNTER — Telehealth: Payer: Self-pay

## 2023-12-27 LAB — COMPREHENSIVE METABOLIC PANEL WITH GFR
ALT: 54 U/L — ABNORMAL HIGH (ref 0–44)
AST: 30 U/L (ref 15–41)
Albumin: 4.6 g/dL (ref 3.5–5.0)
Alkaline Phosphatase: 40 U/L (ref 38–126)
Anion gap: 13 (ref 5–15)
BUN: 13 mg/dL (ref 6–20)
CO2: 22 mmol/L (ref 22–32)
Calcium: 10.1 mg/dL (ref 8.9–10.3)
Chloride: 103 mmol/L (ref 98–111)
Creatinine, Ser: 1.55 mg/dL — ABNORMAL HIGH (ref 0.61–1.24)
GFR, Estimated: 58 mL/min — ABNORMAL LOW (ref >60)
Glucose, Bld: 115 mg/dL — ABNORMAL HIGH (ref 70–99)
Potassium: 3.9 mmol/L (ref 3.5–5.1)
Sodium: 138 mmol/L (ref 135–145)
Total Bilirubin: 0.7 mg/dL (ref 0.0–1.2)
Total Protein: 8.4 g/dL — ABNORMAL HIGH (ref 6.5–8.1)

## 2023-12-27 LAB — PROTIME-INR
INR: 1 (ref 0.8–1.2)
Prothrombin Time: 14.2 s (ref 11.4–15.2)

## 2023-12-27 LAB — CBC WITH DIFFERENTIAL/PLATELET
Abs Immature Granulocytes: 0.04 10*3/uL (ref 0.00–0.07)
Basophils Absolute: 0 10*3/uL (ref 0.0–0.1)
Basophils Relative: 0 %
Eosinophils Absolute: 0 10*3/uL (ref 0.0–0.5)
Eosinophils Relative: 0 %
HCT: 44.5 % (ref 39.0–52.0)
Hemoglobin: 14.4 g/dL (ref 13.0–17.0)
Immature Granulocytes: 0 %
Lymphocytes Relative: 21 %
Lymphs Abs: 2.1 10*3/uL (ref 0.7–4.0)
MCH: 25.8 pg — ABNORMAL LOW (ref 26.0–34.0)
MCHC: 32.4 g/dL (ref 30.0–36.0)
MCV: 79.7 fL — ABNORMAL LOW (ref 80.0–100.0)
Monocytes Absolute: 0.8 10*3/uL (ref 0.1–1.0)
Monocytes Relative: 8 %
Neutro Abs: 7.2 10*3/uL (ref 1.7–7.7)
Neutrophils Relative %: 71 %
Platelets: 277 10*3/uL (ref 150–400)
RBC: 5.58 MIL/uL (ref 4.22–5.81)
RDW: 13.6 % (ref 11.5–15.5)
WBC: 10.2 10*3/uL (ref 4.0–10.5)
nRBC: 0 % (ref 0.0–0.2)

## 2023-12-27 LAB — LAMOTRIGINE LEVEL: Lamotrigine Lvl: 2.6 ug/mL (ref 2.0–20.0)

## 2023-12-27 NOTE — Discharge Instructions (Signed)
 Follow up with your care team Return to the ER for worsening or concerning symptoms.   Home to rest and hydrate.

## 2023-12-27 NOTE — ED Notes (Signed)
 Patient returned from MRI.

## 2023-12-27 NOTE — Telephone Encounter (Signed)
 Pt had 2 seizures on Friday. He went to the hospital, had anther one on Saturday he can't remember it, EMS called he didn't know where he was he thought that he was still in the hospital,  he still has a headache, PCP  upped lyirca to 75mg    PCP  stopped the nortriptyline  and started him on a new medication for fibromyalgia,  pt did take his injection for headaches  he said that he was advised that it was PRN,   Pt c/o: seizure Missed medications?  Yes.   Missed taken the Epidiolex  he was out of medication a for a while he has ordered it, it will be at his house tomorrow  Sleep deprived?  No. Alcohol intake?  No. Increased stress? Yes.   Hot at work  Any change in medication color or shape? No. Any trigger? Really bad headache  Back to their usual baseline self?  Yes.  . If no, advise go to ER Current medications prescribed by Dr. Georjean:   zonisamide  (ZONEGRAN ) 100 MG capsule Take 6 capsules at night  levETIRAcetam  (KEPPRA ) 500 MG tablet  TAKE 3 TABLETS BY MOUTH TWICE DAILY  lamoTRIgine  (LAMICTAL ) 25 MG tablet & lamoTRIgine  (LAMICTAL ) 100 MG tablet   Take 1 tablet twice a day (take with 100mg  tablet for total of 125mg  twice a day)  cannabidiol  (EPIDIOLEX ) 100 MG/ML solution : Take 5.4mL twice a day  ( has not had in a while)

## 2023-12-28 LAB — LEVETIRACETAM LEVEL: Levetiracetam Lvl: 17.4 ug/mL (ref 10.0–40.0)

## 2023-12-28 LAB — LAMOTRIGINE LEVEL: Lamotrigine Lvl: 2.8 ug/mL (ref 2.0–20.0)

## 2023-12-28 NOTE — Telephone Encounter (Signed)
 Breakthrough seizure likely due to missing Epidiolex  for several days. Please have him contact our office if he does not get medication in the future so we can help him, it is very important he always gets his medications. Thanks

## 2023-12-28 NOTE — Telephone Encounter (Signed)
 Pt called an informed Breakthrough seizure likely due to missing Epidiolex  for several days. Please contact our office if he does not get medication in the future so we can help him, it is very important he always gets his medications.

## 2023-12-31 LAB — MISC LABCORP TEST (SEND OUT): Labcorp test code: 7915

## 2024-02-07 ENCOUNTER — Encounter: Payer: Self-pay | Admitting: Neurology

## 2024-02-07 ENCOUNTER — Ambulatory Visit: Admitting: Neurology

## 2024-02-07 VITALS — BP 123/81 | HR 66 | Ht 73.0 in | Wt 236.0 lb

## 2024-02-07 DIAGNOSIS — G40201 Localization-related (focal) (partial) symptomatic epilepsy and epileptic syndromes with complex partial seizures, not intractable, with status epilepticus: Secondary | ICD-10-CM | POA: Diagnosis not present

## 2024-02-07 DIAGNOSIS — G43001 Migraine without aura, not intractable, with status migrainosus: Secondary | ICD-10-CM

## 2024-02-07 MED ORDER — SUMATRIPTAN SUCCINATE 50 MG PO TABS: ORAL_TABLET | ORAL | 6 refills | Status: AC

## 2024-02-07 MED ORDER — EPIDIOLEX 100 MG/ML PO SOLN: ORAL | 5 refills | Status: AC

## 2024-02-07 NOTE — Patient Instructions (Signed)
 Good to see you!  A prescription was sent for as needed Sumatriptan  to take at onset of migraine. Do not take more than 3 a week  2. Continue all your medications  3. Follow-up in 4 months, call for any changes   Seizure Precautions: 1. If medication has been prescribed for you to prevent seizures, take it exactly as directed.  Do not stop taking the medicine without talking to your doctor first, even if you have not had a seizure in a long time.   2. Avoid activities in which a seizure would cause danger to yourself or to others.  Don't operate dangerous machinery, swim alone, or climb in high or dangerous places, such as on ladders, roofs, or girders.  Do not drive unless your doctor says you may.  3. If you have any warning that you may have a seizure, lay down in a safe place where you can't hurt yourself.    4.  No driving for 6 months from last seizure, as per [place]  state law.   Please refer to the following link on the [employer]ile[employer] of [employer]'s website for more information: http://www.epilepsyfoundation.org/answerplace/Social/driving/drivingu.cfm   5.  Maintain good sleep hygiene.  6.  Contact your doctor if you have any problems that may be related to the medicine you are taking.  7.  Call 911 and bring the patient back to the ED if:        A.  The seizure lasts longer than 5 minutes.       B.  The patient doesn't awaken shortly after the seizure  C.  The patient has new problems such as difficulty seeing, speaking or moving  D.  The patient was injured during the seizure  E.  The patient has a temperature over 102 F (39C)  F.  The patient vomited and now is having trouble breathing

## 2024-02-07 NOTE — Progress Notes (Signed)
 NEUROLOGY FOLLOW UP OFFICE NOTE  Don Reed [id number] [birth year]  HISTORY OF PRESENT ILLNESS: I had the pleasure of seeing Don Reed in follow-up in the neurology clinic on 02/07/2024.  The patient was last seen 3 months ago for intractable epilepsy and migraines. He is alone in the office today. Records and images were personally reviewed where available.  He had been doing well on his regimen of Lamotrigine  125mg  BID, Epidiolex  5.4mL BID, Levetiracetam  1500mg  BID, and Zonisamide  600mg  at bedtime, with infrequent seizures. Since last visit, there is note of a seizure on 6/5, then he was in the ER on 12/24/23. He reports having 3 seizures at that time. He had one in the car which was longer than usual, then one at [facility]. He then had another when they got home. MRI brain on 12/27/23 did not show any acute changes, there was right frontal approach VP shunt, similar chronic midline cerebellar volume loss and dystrophic calcifications. He reports that Epidiolex  was not delivered on time so he missed doses. He denies any further seizures since back on his regimen. He continues on nortriptyline  100mg  at bedtime for migraine prophylaxis. He was previously on Emgality  but stopped it because he was not having migraines, however recently he has been having migraines again. He had 2 in July, none this month so far. He has prn Maxalt  but it makes him feel nauseated. He gets close to 8 hours of sleep. He tries not to have anxiety get him, sometimes it comes out of nowhere. He continues to have diffuse body pains, the Lyrica dose was increased by his PCP and the pain is not like it used to be, but he mostly has pain in the legs and right elbow, messing with his ability to workout. He does not feel balanced all the time, and wonders if it is his vision, he is waiting for new prescriptions. He has noticed hand tremors, L>R. He continues to work with [employer].    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 He states that at age 69, he was having episodes of slurred speech, falls, recurrent syncope, and headaches, and was found to have the brain tumor. He recalls when he was younger, his head kept turning to one side and he would try to stop it. After the surgery, he did fairly well except for frequent headaches, then had his first seizure at age 70 or 55. He describes seizures as starting with back pain, then he gets a metallic taste in his mouth, his head starts hurting more, then he gets disoriented and has been told he would become unresponsive then proceed to generalized shaking, at times with urinary incontinence. He lives with his fiancee and brother, and denies any isolated staring spells. He has been told he has seizures in his sleep. After a seizure, he denies any focal weakness, he can hear people around him but has difficulty getting his words out. He also has some nausea. He has fallen twice this year with his seizures, he was in the ER on 09/05/15 and 12/02/15 for seizure, during both ER visits, he would have a witnessed seizure in the ER. With the most recent seizure on 12/02/15, he had ran out of Vimpat . He has been taking Vimpat  100mg  BID since 2015, on Keppra  1500mg  BID since 2011. He recalls taking Depakote and  Topamax in the past, with cognitive side effects. He continues to report memory issues, I can't think too good. He feels his left side is different, with numbness in his left arm and leg. He had cervical fusion surgery and has been told this is the cause of his left-sided symptoms. His last shunt revision was in 1999. 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    He has a history of chronic headaches, with throbbing headaches occurring around twice a week over the frontal and temporal regions, with associated nausea, vomiting, photo/phonophobia. They would last for 1 to  1-1/2 days, he does not take any medication because he feels they have provoked seizures in the past. He reports taking an unrecalled headache preventative medication in [place]  which was helpful. He denies any dizziness, bowel/bladder dysfunction, no myoclonic jerks. He has occasional twitching in an arm or leg. He has chronic neck and back pain.    Epilepsy Risk Factors:  His father had generalized convulsions, his mother had seizures as well. He has a history of benign brain tumor s/p VP shunt, last revision in 1999. He reports several head injuries with loss of consciousness, most recently when hit in the face by a ball a few years ago. Otherwise he had a normal birth and early development.  There is no history of febrile convulsions, CNS infections such as meningitis/encephalitis.   Prior ASMs: Depakote, Topamax, Vimpat  Prior migraine preventative medications: Emgality  Prior migraine rescue medications: Rizatriptan   Diagnostic Data: EEG 01/2016 normal  MRI brain with and without contrast 07/27/2016: no acute changes seen. There were stable postsurgical changes in the posterior fossa related to resection of previous mass. 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 No recurrent mass or abnormal enhancement seen. There is a right transfrontal ventricular shunt seen terminating within the right ventricle. Hippocami symmetric.   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 No hydrocephalus  MRI cervical spine in 07/2017 showed moderate stenosis at C3-4, moderate foraminal encroachment right greater than left  at C3-4, no change from prior MRI. There is moderate right foraminal narrowing at C4-5 and mild left foraminal narrowing at C4-5, unchanged. 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    PAST MEDICAL HISTORY: Past Medical History:  Diagnosis Date   Asthma    Cancer (HCC)    Chronic back pain    History of brain shunt    History of brain tumor    Hydrocephalus (HCC)    Legally blind    Migraine    Seizures (HCC)    Vision loss     MEDICATIONS: Current Outpatient Medications on File Prior to Visit  Medication Sig Dispense Refill   cannabidiol  (EPIDIOLEX ) 100 MG/ML solution Take 5.4mL twice a day 324 mL 5   celecoxib (CELEBREX) 100 MG capsule Take 100 mg by mouth 2 (two) times daily. As needed     Cholecalciferol 125 MCG (5000 UT) TABS Take by mouth.     lamoTRIgine  (LAMICTAL ) 100 MG tablet Take 1 tablet twice a day (take with 25mg   tablet for total of 125mg  twice a day) 180 tablet 3   lamoTRIgine  (LAMICTAL ) 25 MG tablet Take 1 tablet twice a day (take with 100mg  tablet for total of 125mg  twice a day) 180 tablet 3   levETIRAcetam  (KEPPRA ) 500 MG tablet TAKE 3 TABLETS BY MOUTH TWICE DAILY 180 tablet 11   linaclotide (LINZESS) 145 MCG CAPS capsule Take 145 mcg by mouth daily before breakfast.     Multiple Vitamin (MULTIVITAMIN) tablet Take 1 tablet by mouth daily.     nortriptyline  (PAMELOR ) 50 MG capsule TAKE 2 CAPSULES BY MOUTH EVERY NIGHT 180 capsule 2   ondansetron  (ZOFRAN -ODT) 4 MG disintegrating tablet Take 1 tablet (4 mg total) by mouth every 8 (eight) hours as needed. 10 tablet 0   pregabalin (LYRICA) 50 MG capsule Take 50 mg by mouth 2 (two) times daily.     PROVENTIL HFA 108 (90 BASE) MCG/ACT inhaler Inhale 2 puffs into the lungs every 6 (six) hours as needed for shortness of breath.   3   rizatriptan  (MAXALT ) 10 MG tablet Take 1 tablet at onset of  migraine. Do not take more than 3 in a week 10 tablet 11   zonisamide  (ZONEGRAN ) 100 MG capsule Take 6 capsules at night 540 capsule 3   [DISCONTINUED] diphenhydramine -acetaminophen  (TYLENOL  PM) 25-500 MG TABS tablet Take 2 tablets by mouth at bedtime as needed (for sleep).      No current facility-administered medications on file prior to visit.    ALLERGIES: Allergies  Allergen Reactions   Amoxicillin Itching and Rash    Has patient had a PCN reaction causing immediate rash, facial/tongue/throat swelling, SOB or lightheadedness with hypotension: Yes Has patient had a PCN reaction causing severe rash involving mucus membranes or skin necrosis: No Has patient had a PCN reaction that required hospitalization No Has patient had a PCN reaction occurring within the last 10 years: Yes If all of the above answers are NO, then may proceed with Cephalosporin use.    Shellfish Allergy Anaphylaxis, Hives, Shortness Of Breath and Rash    Cannot breathe    Vancomycin Itching and Rash    FAMILY HISTORY: Family History  Problem Relation Age of Onset   Hypertension Mother    Seizures Mother    Cataracts Mother    Stroke Mother    Stroke Father    Seizures Father    Scoliosis Sister    Vision loss Sister    Vision loss Brother    Diabetes Brother    Leukemia Brother     SOCIAL HISTORY: Social History   Socioeconomic History   Marital status: Married    Spouse name: Not on file   Number of children: 2   Years of education: GED   Highest education level: Not on file  Occupational History    Employer: [employer]  Tobacco Use   Smoking status: Never   Smokeless tobacco: Never  Vaping Use   Vaping status: Never Used  Substance and Sexual Activity   Alcohol use: Yes    Comment: wine   Drug use: No   Sexual activity: Not on file  Other Topics Concern   Not on file  Social History Narrative   Patient lives at home with family.   Caffeine  Use: 1-3 time weekly   Right  handed   Social Drivers of Corporate investment banker Strain: Not on file  Food Insecurity: Not on file  Transportation Needs: Not on file  Physical Activity: Not on  file  Stress: Not on file  Social Connections: Unknown (10/31/2021)   Received from [facility]   Social Network    Social Network: Not on file  Intimate Partner Violence: Unknown (10/01/2021)   Received from [facility]   HITS    Physically Hurt: Not on file    Insult or Talk Down To: Not on file    Threaten Physical Harm: Not on file    Scream or Curse: Not on file     PHYSICAL EXAM: Vitals:   02/07/24 1530  BP: 123/81  Pulse: 66  SpO2: 98%   General: No acute distress Head:  Normocephalic/atraumatic Skin/Extremities: No rash, no edema Neurological Exam: alert and awake. No aphasia or dysarthria. Fund of knowledge is appropriate.  Attention and concentration are normal.   Cranial nerves: Pupils equal, round. Extraocular movements intact with with right-beating nystagmus on primary gaze, more prominent on right gaze (similar to prior). Visual fields full.  No facial asymmetry.  Motor: Bulk and tone normal, muscle strength 5/5 throughout with no pronator drift.   Finger to nose testing intact.  Gait narrow-based and steady, able to tandem walk adequately.  Romberg negative. 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 He had 3 seizures in one day on 12/24/23 in the setting of missed Epidiolex  doses. No seizures since then back on his regimen of Lamotrigine  125mg  BID, Epidiolex  5.4mL BID, Levetiracetam  1500mg  BID, and Zonisamide  600mg . We may consider reducing Lamotrigine  on next visit to streamline medications. He is on Nortriptyline  100mg  at bedtime for migraine prophylaxis.  He reports side effects on Maxalt  and will try Sumatriptan  for migraine rescue, side effects discussed. If migraines worsen, would restart Emgality . He does not drive. We discussed his brain MRI, there is no hydrocephalus due to functioning VP shunt. He does not drive. Follow-up in 4 months, call for any changes.   Thank you for allowing me to participate in his care.  Please do not hesitate to call for any questions or concerns.    Darice Shivers, M.D.   CC: [facility]

## 2024-04-11 ENCOUNTER — Other Ambulatory Visit (HOSPITAL_BASED_OUTPATIENT_CLINIC_OR_DEPARTMENT_OTHER): Payer: Self-pay

## 2024-04-11 MED ORDER — FLUZONE 0.5 ML IM SUSY
0.5000 mL | PREFILLED_SYRINGE | Freq: Once | INTRAMUSCULAR | 0 refills | Status: AC
  Filled 2024-04-11: qty 0.5, 1d supply, fill #0

## 2024-04-17 ENCOUNTER — Ambulatory Visit (HOSPITAL_COMMUNITY)

## 2024-06-23 ENCOUNTER — Telehealth: Payer: Self-pay

## 2024-06-23 NOTE — Telephone Encounter (Signed)
 PA is needed for Epidiolex  100mg /Ml solution

## 2024-06-26 ENCOUNTER — Other Ambulatory Visit (HOSPITAL_COMMUNITY): Payer: Self-pay

## 2024-06-26 ENCOUNTER — Telehealth: Payer: Self-pay | Admitting: Pharmacy Technician

## 2024-06-26 NOTE — Telephone Encounter (Signed)
 Pharmacy Patient Advocate Encounter   Received notification from Pt Calls Messages that prior authorization for EPIDIOLEX  is required/requested.   Insurance verification completed.   The patient is insured through [facility] MEDICAID.   Per test claim: PA required; PA submitted to above mentioned insurance via Latent Key/confirmation #/EOC [id number] Status is pending

## 2024-06-26 NOTE — Telephone Encounter (Signed)
 [facility] is calling to follow up on the PA for the epidiolex 

## 2024-06-26 NOTE — Telephone Encounter (Signed)
 PA has been submitted, and telephone encounter has been created. Please see telephone encounter dated 12.29.25.

## 2024-06-28 ENCOUNTER — Telehealth: Payer: Self-pay | Admitting: Neurology

## 2024-06-28 NOTE — Telephone Encounter (Signed)
 Pt called he is out of epidiolex  we need urgent appeal

## 2024-06-28 NOTE — Telephone Encounter (Signed)
 Who's calling (name and relationship to patient) : [facility]   Best contact number: [phone]  Provider they see: Georjean, MD   Reason for call: Corean called in regarding epidiolex , she wanted to know if the office received a denial letter by the insurance. She wanted to know the next steps to take regarding the Rx, do they need to cancel it, try a different Rx. She is requesting a call back.

## 2024-06-28 NOTE — Telephone Encounter (Signed)
 Goble called in follow up on if the office has received fax from [employer].   PH: [phone].

## 2024-06-30 ENCOUNTER — Other Ambulatory Visit (HOSPITAL_COMMUNITY): Payer: Self-pay

## 2024-06-30 NOTE — Telephone Encounter (Signed)
 Pharmacy Patient Advocate Encounter  Received notification from [employer] that Prior Authorization for EPIDIOLEX  100MG  has been DENIED.  See denial reason below. No denial letter attached in CMM. Will attach denial letter to Media tab once received.    PA #/Case ID/Reference #: [id number]

## 2024-06-30 NOTE — Telephone Encounter (Signed)
 Information has been sent to clinical pharmacist for appeals review. It may take 5-7 days to prepare the necessary documentation to request the appeal from the insurance.  Please provide any additional information to support the reason for denial of seizures association with Lennox Gastaut syndrome, Dravet syndrome or tuberous sclerosis complex.

## 2024-07-03 ENCOUNTER — Telehealth: Payer: Self-pay | Admitting: Pharmacist

## 2024-07-03 NOTE — Telephone Encounter (Signed)
 Does this need to be in a letter?  Don Reed has been under my care since 2017 for intractable epilepsy. He has a history of brain tumor resection s/p VP shunt at age 39 and has had recurrent seizures while on 3 seizure medications (Lamotrigine , Levetiracetam , Zonisamide ). He has tried Depakote, Vimpat , Topiramate which were ineffective. He has been taking Epidiolex  since July 2023 with significant reduction in his seizures. It has proven to be an essential part of his treatment regimen. Discontinuing or altering the use of Epidiolex  would likely result in a return of seizures, which could severely impact his daily functioning, safety, and overall well-being.

## 2024-07-03 NOTE — Telephone Encounter (Signed)
 Thanks so much! If possible to do an expedited appeal since he is out of medication and at risk of seizures with change in regimen, thank you!!

## 2024-07-03 NOTE — Telephone Encounter (Signed)
 Appeal has been submitted. The request was made for an expedited review but the final decision lies with the insurance company. Will advise when response is received, please be advised that most companies may take 30 days to make a decision. Appeal letter and supporting documentation have been faxed to [phone] on 07/03/2024 @2 :02 pm.  Thank you, Devere Pandy, PharmD Clinical Pharmacist  [place]  Direct Dial: [phone]

## 2024-07-10 ENCOUNTER — Telehealth: Payer: Self-pay | Admitting: Neurology

## 2024-07-10 ENCOUNTER — Other Ambulatory Visit (HOSPITAL_COMMUNITY): Payer: Self-pay

## 2024-07-10 NOTE — Telephone Encounter (Signed)
 Appeal for Epidiolex  has been approved by the insurance through 07/04/2025.    Thank you, Devere Pandy, PharmD Clinical Pharmacist  [place]  Direct Dial: [phone]

## 2024-07-10 NOTE — Telephone Encounter (Signed)
 Pls ask exactly what he needs in the letter, does he need a letter stating his diagnosis or does it need anything else? Isn't there a form in the school that we fill out about his medical condition? So we don't duplicate paperwork

## 2024-07-10 NOTE — Telephone Encounter (Signed)
 Pt stated that the letter just needs to have his DX and that if you think that it needs he started a new medication this summer to try and help control seizures?  He has a form that he has to fill out to go with the letter

## 2024-07-10 NOTE — Telephone Encounter (Signed)
 Pt called in this afternoon and he stated that he is in college full time. Pt stated that his financial  was denied , due to last summer he had seizures and he could not continue his classes. Pt stated that he needs a letter stating that so Pt.  can get it to financial aid to try to get his financial back. Pt stated that school just started today so he needs the letter as s0on as possible. Thanks.

## 2024-07-10 NOTE — Telephone Encounter (Signed)
 Great news! Thanks for your help with this. Heather, pls let Christo know so he can get med, thanks

## 2024-07-12 ENCOUNTER — Other Ambulatory Visit: Payer: Self-pay

## 2024-07-13 ENCOUNTER — Other Ambulatory Visit (HOSPITAL_COMMUNITY): Payer: Self-pay

## 2024-07-13 NOTE — Telephone Encounter (Signed)
 [facility]ed to [facility].
